# Patient Record
Sex: Female | Born: 1965
Health system: Southern US, Community
[De-identification: ages and names within clinical notes are randomized; demographics above are authoritative.]

## PROBLEM LIST (undated history)

## (undated) DIAGNOSIS — J45909 Unspecified asthma, uncomplicated: Secondary | ICD-10-CM

## (undated) DIAGNOSIS — K219 Gastro-esophageal reflux disease without esophagitis: Secondary | ICD-10-CM

## (undated) DIAGNOSIS — T7840XA Allergy, unspecified, initial encounter: Secondary | ICD-10-CM

## (undated) DIAGNOSIS — E282 Polycystic ovarian syndrome: Secondary | ICD-10-CM

## (undated) DIAGNOSIS — E559 Vitamin D deficiency, unspecified: Secondary | ICD-10-CM

## (undated) DIAGNOSIS — M1990 Unspecified osteoarthritis, unspecified site: Secondary | ICD-10-CM

## (undated) DIAGNOSIS — E210 Primary hyperparathyroidism: Secondary | ICD-10-CM

## (undated) DIAGNOSIS — E039 Hypothyroidism, unspecified: Secondary | ICD-10-CM

## (undated) DIAGNOSIS — K5900 Constipation, unspecified: Secondary | ICD-10-CM

## (undated) DIAGNOSIS — E739 Lactose intolerance, unspecified: Secondary | ICD-10-CM

## (undated) DIAGNOSIS — Z87442 Personal history of urinary calculi: Secondary | ICD-10-CM

## (undated) DIAGNOSIS — R7303 Prediabetes: Secondary | ICD-10-CM

## (undated) DIAGNOSIS — F329 Major depressive disorder, single episode, unspecified: Secondary | ICD-10-CM

## (undated) DIAGNOSIS — Z86718 Personal history of other venous thrombosis and embolism: Secondary | ICD-10-CM

## (undated) DIAGNOSIS — M2550 Pain in unspecified joint: Secondary | ICD-10-CM

## (undated) DIAGNOSIS — G479 Sleep disorder, unspecified: Secondary | ICD-10-CM

## (undated) DIAGNOSIS — I82409 Acute embolism and thrombosis of unspecified deep veins of unspecified lower extremity: Secondary | ICD-10-CM

## (undated) DIAGNOSIS — I872 Venous insufficiency (chronic) (peripheral): Secondary | ICD-10-CM

## (undated) DIAGNOSIS — M7989 Other specified soft tissue disorders: Secondary | ICD-10-CM

## (undated) DIAGNOSIS — E079 Disorder of thyroid, unspecified: Secondary | ICD-10-CM

## (undated) DIAGNOSIS — G4730 Sleep apnea, unspecified: Secondary | ICD-10-CM

## (undated) DIAGNOSIS — D649 Anemia, unspecified: Secondary | ICD-10-CM

## (undated) DIAGNOSIS — F32A Depression, unspecified: Secondary | ICD-10-CM

## (undated) DIAGNOSIS — M549 Dorsalgia, unspecified: Secondary | ICD-10-CM

## (undated) HISTORY — DX: Acute embolism and thrombosis of unspecified deep veins of unspecified lower extremity: I82.409

## (undated) HISTORY — DX: Unspecified osteoarthritis, unspecified site: M19.90

## (undated) HISTORY — DX: Constipation, unspecified: K59.00

## (undated) HISTORY — DX: Personal history of other venous thrombosis and embolism: Z86.718

## (undated) HISTORY — DX: Sleep disorder, unspecified: G47.9

## (undated) HISTORY — DX: Allergy, unspecified, initial encounter: T78.40XA

## (undated) HISTORY — DX: Venous insufficiency (chronic) (peripheral): I87.2

## (undated) HISTORY — DX: Primary hyperparathyroidism: E21.0

## (undated) HISTORY — DX: Lactose intolerance, unspecified: E73.9

## (undated) HISTORY — DX: Other specified soft tissue disorders: M79.89

## (undated) HISTORY — DX: Polycystic ovarian syndrome: E28.2

## (undated) HISTORY — PX: GASTRIC RESTRICTION SURGERY: SHX653

## (undated) HISTORY — DX: Prediabetes: R73.03

## (undated) HISTORY — DX: Pain in unspecified joint: M25.50

## (undated) HISTORY — DX: Major depressive disorder, single episode, unspecified: F32.9

## (undated) HISTORY — DX: Dorsalgia, unspecified: M54.9

## (undated) HISTORY — PX: CHOLECYSTECTOMY: SHX55

## (undated) HISTORY — PX: HERNIA REPAIR: SHX51

## (undated) HISTORY — DX: Depression, unspecified: F32.A

## (undated) HISTORY — DX: Vitamin D deficiency, unspecified: E55.9

## (undated) HISTORY — DX: Hypothyroidism, unspecified: E03.9

---

## 1998-04-27 ENCOUNTER — Encounter: Admission: RE | Admit: 1998-04-27 | Discharge: 1998-07-26 | Payer: Self-pay

## 1998-05-09 ENCOUNTER — Inpatient Hospital Stay (HOSPITAL_COMMUNITY): Admission: AD | Admit: 1998-05-09 | Discharge: 1998-05-16 | Payer: Self-pay | Admitting: Cardiovascular Disease

## 1998-05-09 ENCOUNTER — Encounter: Payer: Self-pay | Admitting: Cardiovascular Disease

## 1998-07-12 ENCOUNTER — Encounter: Admission: RE | Admit: 1998-07-12 | Discharge: 1998-07-12 | Payer: Self-pay | Admitting: Obstetrics & Gynecology

## 1999-11-29 ENCOUNTER — Emergency Department (HOSPITAL_COMMUNITY): Admission: EM | Admit: 1999-11-29 | Discharge: 1999-11-29 | Payer: Self-pay | Admitting: Emergency Medicine

## 1999-11-29 ENCOUNTER — Encounter: Payer: Self-pay | Admitting: Emergency Medicine

## 2001-08-26 ENCOUNTER — Emergency Department (HOSPITAL_COMMUNITY): Admission: EM | Admit: 2001-08-26 | Discharge: 2001-08-26 | Payer: Self-pay | Admitting: Emergency Medicine

## 2003-05-05 ENCOUNTER — Encounter: Admission: RE | Admit: 2003-05-05 | Discharge: 2003-08-03 | Payer: Self-pay | Admitting: Internal Medicine

## 2003-08-11 ENCOUNTER — Encounter: Admission: RE | Admit: 2003-08-11 | Discharge: 2003-11-09 | Payer: Self-pay | Admitting: Internal Medicine

## 2003-10-21 ENCOUNTER — Other Ambulatory Visit: Admission: RE | Admit: 2003-10-21 | Discharge: 2003-10-21 | Payer: Self-pay | Admitting: Obstetrics and Gynecology

## 2003-10-29 ENCOUNTER — Ambulatory Visit (HOSPITAL_COMMUNITY): Admission: RE | Admit: 2003-10-29 | Discharge: 2003-10-29 | Payer: Self-pay | Admitting: Obstetrics and Gynecology

## 2003-11-17 ENCOUNTER — Encounter: Admission: RE | Admit: 2003-11-17 | Discharge: 2004-02-15 | Payer: Self-pay | Admitting: Internal Medicine

## 2005-08-22 ENCOUNTER — Ambulatory Visit (HOSPITAL_COMMUNITY): Admission: RE | Admit: 2005-08-22 | Discharge: 2005-08-22 | Payer: Self-pay | Admitting: Surgery

## 2005-08-23 ENCOUNTER — Encounter: Admission: RE | Admit: 2005-08-23 | Discharge: 2005-08-23 | Payer: Self-pay | Admitting: Internal Medicine

## 2005-08-24 ENCOUNTER — Ambulatory Visit (HOSPITAL_COMMUNITY): Admission: RE | Admit: 2005-08-24 | Discharge: 2005-08-24 | Payer: Self-pay | Admitting: Surgery

## 2005-08-25 ENCOUNTER — Ambulatory Visit (HOSPITAL_BASED_OUTPATIENT_CLINIC_OR_DEPARTMENT_OTHER): Admission: RE | Admit: 2005-08-25 | Discharge: 2005-08-25 | Payer: Self-pay | Admitting: Surgery

## 2005-09-02 ENCOUNTER — Ambulatory Visit: Payer: Self-pay | Admitting: Internal Medicine

## 2005-11-29 ENCOUNTER — Ambulatory Visit (HOSPITAL_COMMUNITY): Admission: RE | Admit: 2005-11-29 | Discharge: 2005-11-29 | Payer: Self-pay | Admitting: Surgery

## 2006-02-15 ENCOUNTER — Ambulatory Visit (HOSPITAL_COMMUNITY): Admission: RE | Admit: 2006-02-15 | Discharge: 2006-02-15 | Payer: Self-pay | Admitting: Surgery

## 2006-03-08 ENCOUNTER — Ambulatory Visit (HOSPITAL_COMMUNITY): Admission: RE | Admit: 2006-03-08 | Discharge: 2006-03-08 | Payer: Self-pay | Admitting: Surgery

## 2008-03-22 ENCOUNTER — Ambulatory Visit (HOSPITAL_COMMUNITY): Admission: RE | Admit: 2008-03-22 | Discharge: 2008-03-22 | Payer: Self-pay | Admitting: Surgery

## 2008-03-23 ENCOUNTER — Encounter: Admission: RE | Admit: 2008-03-23 | Discharge: 2008-03-23 | Payer: Self-pay | Admitting: Surgery

## 2008-04-05 ENCOUNTER — Ambulatory Visit (HOSPITAL_COMMUNITY): Admission: RE | Admit: 2008-04-05 | Discharge: 2008-04-05 | Payer: Self-pay | Admitting: Surgery

## 2009-01-13 ENCOUNTER — Encounter: Admission: RE | Admit: 2009-01-13 | Discharge: 2009-04-13 | Payer: Self-pay | Admitting: Surgery

## 2009-01-20 ENCOUNTER — Encounter: Admission: RE | Admit: 2009-01-20 | Discharge: 2009-01-20 | Payer: Self-pay | Admitting: Surgery

## 2009-01-31 ENCOUNTER — Encounter (INDEPENDENT_AMBULATORY_CARE_PROVIDER_SITE_OTHER): Payer: Self-pay | Admitting: Surgery

## 2009-01-31 ENCOUNTER — Ambulatory Visit: Payer: Self-pay | Admitting: Vascular Surgery

## 2009-01-31 ENCOUNTER — Ambulatory Visit (HOSPITAL_COMMUNITY): Admission: RE | Admit: 2009-01-31 | Discharge: 2009-02-01 | Payer: Self-pay | Admitting: Surgery

## 2009-04-28 ENCOUNTER — Encounter: Admission: RE | Admit: 2009-04-28 | Discharge: 2009-05-18 | Payer: Self-pay | Admitting: Surgery

## 2010-08-25 LAB — CBC
HCT: 35.8 % — ABNORMAL LOW (ref 36.0–46.0)
Hemoglobin: 11.8 g/dL — ABNORMAL LOW (ref 12.0–15.0)
Hemoglobin: 13.1 g/dL (ref 12.0–15.0)
MCHC: 33.2 g/dL (ref 30.0–36.0)
MCV: 78.2 fL (ref 78.0–100.0)
RDW: 16 % — ABNORMAL HIGH (ref 11.5–15.5)
WBC: 5.4 10*3/uL (ref 4.0–10.5)

## 2010-08-25 LAB — COMPREHENSIVE METABOLIC PANEL
ALT: 24 U/L (ref 0–35)
BUN: 11 mg/dL (ref 6–23)
CO2: 25 mEq/L (ref 19–32)
Creatinine, Ser: 0.73 mg/dL (ref 0.4–1.2)
Glucose, Bld: 102 mg/dL — ABNORMAL HIGH (ref 70–99)
Sodium: 139 mEq/L (ref 135–145)
Total Protein: 7.3 g/dL (ref 6.0–8.3)

## 2010-08-25 LAB — DIFFERENTIAL
Basophils Absolute: 0 10*3/uL (ref 0.0–0.1)
Basophils Relative: 0 % (ref 0–1)
Eosinophils Absolute: 0.2 10*3/uL (ref 0.0–0.7)
Eosinophils Relative: 0 % (ref 0–5)
Lymphocytes Relative: 27 % (ref 12–46)
Lymphs Abs: 1.3 10*3/uL (ref 0.7–4.0)
Lymphs Abs: 1.5 10*3/uL (ref 0.7–4.0)
Monocytes Relative: 6 % (ref 3–12)
Monocytes Relative: 7 % (ref 3–12)
Neutro Abs: 5.8 10*3/uL (ref 1.7–7.7)
Neutrophils Relative %: 61 % (ref 43–77)

## 2010-10-06 NOTE — Procedures (Signed)
Cynthia [name], Cynthia [name]               ACCOUNT NO.:  [id number]   MEDICAL RECORD NO.:  [id number]          PATIENT TYPE:  OUT   LOCATION:  SLEEP CENTER                 FACILITY:  [facility] [place] [facility]   PHYSICIAN:  Clinton D. Maple Hudson, M.D. DATE OF BIRTH:  [birth year]   DATE OF STUDY:  08/25/2005                              NOCTURNAL POLYSOMNOGRAM   REFERRING PHYSICIAN:  Dr. Luretha Murphy   INDICATION FOR STUDY:  Insomnia with sleep apnea.   EPWORTH SLEEPINESS SCORE:  6/24, BMI 53, weight 344 pounds.  Patient  describes a stressful lifestyle, fragmented sleep, and third shift work  responsibility.   MEDICATIONS:  HCTZ, aspirin, multivitamins.   SLEEP ARCHITECTURE:  Total sleep time 401 minutes with sleep efficiency 93%.  Stage I was 7%, stage II 63%, stages III and 4%, REM 26% of total sleep  time.  Sleep latency 7 minutes, REM latency 69 minutes, awake after sleep  onset 16 minutes, arousal index 19.9.  No bedtime medication taken.   RESPIRATORY DATA:  Apnea/hypopnea index (AHI, RDI) 20 obstructive events per  hour indicating moderate obstructive sleep apnea/hypopnea syndrome.  This  included 26 obstructive apneas and 108 hypopneas.  Events were not  positional.  REM AHI 49.7 per hour.  She did not have enough early events to  qualify for CPAP titration by split protocol.   OXYGEN DATA:  Moderate to loud snoring during approximately 50% of the night  with oxygen desaturation to a nadir of 84%.  Mean oxygen saturation through  the study was 94% on room air.   CARDIAC DATA:  Sinus rhythm.   MOVEMENT-PARASOMNIA:  No significant movement disturbance.  Bathroom x1.   IMPRESSION/RECOMMENDATIONS:  1.  Moderate obstructive sleep apnea/hypopnea syndrome occurring in all of      sleep positions, but more common while supine and in REM with moderate      to loud snoring at oxygen desaturation to 84%.  2.  Consider return for CPAP titration or evaluate for alternative therapies      as appropriate.  If  surgical management      is anticipated before she is titrated for CPAP, then suggest use of      empiric CPAP pressure of 10 CWP through the time of necessary surgery      and postoperative management until she can return for formal CPAP      titration.      Clinton D. Maple Hudson, M.D.  Diplomate, Biomedical engineer of Sleep Medicine  Electronically Signed     CDY/MEDQ  D:  09/02/2005 13:47:06  T:  09/03/2005 08:16:06  Job:  [id number]

## 2010-10-19 ENCOUNTER — Other Ambulatory Visit (INDEPENDENT_AMBULATORY_CARE_PROVIDER_SITE_OTHER): Payer: Self-pay | Admitting: Surgery

## 2010-10-19 ENCOUNTER — Encounter (HOSPITAL_COMMUNITY): Payer: 59

## 2010-10-19 LAB — CBC
Hemoglobin: 12.7 g/dL (ref 12.0–15.0)
Platelets: 225 10*3/uL (ref 150–400)
RBC: 5.05 MIL/uL (ref 3.87–5.11)
RDW: 14.4 % (ref 11.5–15.5)
WBC: 7 10*3/uL (ref 4.0–10.5)

## 2010-10-19 LAB — BASIC METABOLIC PANEL
Calcium: 10.6 mg/dL — ABNORMAL HIGH (ref 8.4–10.5)
Creatinine, Ser: 0.65 mg/dL (ref 0.4–1.2)
GFR calc non Af Amer: >60 mL/min (ref >60)
Potassium: 3.6 mEq/L (ref 3.5–5.1)

## 2010-10-19 LAB — SURGICAL PCR SCREEN: Staphylococcus aureus: NEGATIVE

## 2010-10-25 ENCOUNTER — Ambulatory Visit (HOSPITAL_COMMUNITY)
Admission: RE | Admit: 2010-10-25 | Discharge: 2010-10-26 | Disposition: A | Discharge status: Home / Self Care | Payer: 59 | Source: Ambulatory Visit | Attending: Surgery | Admitting: Surgery

## 2010-10-25 ENCOUNTER — Ambulatory Visit (HOSPITAL_COMMUNITY): Payer: 59

## 2010-10-25 ENCOUNTER — Other Ambulatory Visit (INDEPENDENT_AMBULATORY_CARE_PROVIDER_SITE_OTHER): Payer: Self-pay | Admitting: Surgery

## 2010-10-25 DIAGNOSIS — Z79899 Other long term (current) drug therapy: Secondary | ICD-10-CM | POA: Insufficient documentation

## 2010-10-25 DIAGNOSIS — K8010 Calculus of gallbladder with chronic cholecystitis without obstruction: Secondary | ICD-10-CM | POA: Insufficient documentation

## 2010-10-25 DIAGNOSIS — Z86718 Personal history of other venous thrombosis and embolism: Secondary | ICD-10-CM | POA: Insufficient documentation

## 2010-10-25 DIAGNOSIS — E669 Obesity, unspecified: Secondary | ICD-10-CM | POA: Insufficient documentation

## 2010-10-25 DIAGNOSIS — Z9884 Bariatric surgery status: Secondary | ICD-10-CM | POA: Insufficient documentation

## 2010-10-26 LAB — CBC
HCT: 35.7 % — ABNORMAL LOW (ref 36.0–46.0)
Hemoglobin: 11.3 g/dL — ABNORMAL LOW (ref 12.0–15.0)
MCHC: 31.7 g/dL (ref 30.0–36.0)
RBC: 4.48 MIL/uL (ref 3.87–5.11)
WBC: 6.9 10*3/uL (ref 4.0–10.5)

## 2010-10-26 LAB — COMPREHENSIVE METABOLIC PANEL
AST: 29 U/L (ref 0–37)
Albumin: 3.1 g/dL — ABNORMAL LOW (ref 3.5–5.2)
Alkaline Phosphatase: 51 U/L (ref 39–117)
BUN: 5 mg/dL — ABNORMAL LOW (ref 6–23)
Calcium: 10.7 mg/dL — ABNORMAL HIGH (ref 8.4–10.5)
GFR calc Af Amer: >60 mL/min (ref >60)
GFR calc non Af Amer: >60 mL/min (ref >60)
Sodium: 138 mEq/L (ref 135–145)
Total Protein: 6.4 g/dL (ref 6.0–8.3)

## 2010-11-01 NOTE — Op Note (Signed)
  NAMETHIRZA, PELLICANO               ACCOUNT NO.:  [id number]  MEDICAL RECORD NO.:  [id number]  LOCATION:  DAYL                         FACILITY:  [facility]  PHYSICIAN:  Thornton Park. Daphine Deutscher, MD  DATE OF BIRTH:  04-26-1966  DATE OF PROCEDURE:  10/25/2010 DATE OF DISCHARGE:                              OPERATIVE REPORT   PREOPERATIVE DIAGNOSIS:  Gallstones status post lap band.  PROCEDURE:  Laparoscopic cholecystectomy with intraoperative cholangiogram.  POSTOPERATIVE DIAGNOSIS:  Evidence of chronic cholecystitis with gallbladder infundibulum stuck to the duodenum.  SURGEON:  Thornton Park. Daphine Deutscher, MD  ASSISTANT:  Anselm Pancoast. Zachery Dakins, M.D.  ANESTHESIA:  General anesthesia.  DESCRIPTION OF PROCEDURE:  This 45 year old African-American lady was taken to room #1 on Wednesday, October 25, 2010, and given general anesthesia.  The abdomen was prepped with PCMX.  Time-out was performed. The abdomen was entered through the right upper quadrant with a 0-degree 5-mm Optiview without difficulty.  The abdomen was insufflated.  A total of 5 trocars were needed, mostly 5 and one 11 in the upper midline. This was subsequently closed at the end of the case with Endoclose as I had to dilate to get the bag containing the gallstone out.  In the meantime, the gallbladder was elevated over the liver.  The infundibulum was stuck to the duodenum.  This was teased away with blunt dissection minimizing the monopolar cautery.  Then a critical view was obtained after opening up that area and lot of stickiness to the infundibulum was taken down.  Clip was placed in the gallbladder and a cholangiogram performed which showed good intrahepatic filling, free flow into the duodenum.  No evidence of stones.  Cystic duct was triple clipped, divided.  The cystic artery was triple clipped and divided.  The gallbladder was removed the gallbladder bed with hook electrocautery without spilling any stones.  The gallbladder was  then placed in a bag and brought out through the upper midline 11 mm port after dilating it. As I mentioned the Endoclose was used to secure the upper port.  The ports were injected with Marcaine.  The gallbladder bed was irrigated. No bleeding or bile leaks were noted.  Skin was closed with 4-0 Vicryl, Benzoin and Steri-Strips.  The patient tolerated the procedure well and was taken to the recovery room in satisfactory addition.     Thornton Park Daphine Deutscher, MD    MBM/MEDQ  D:  10/25/2010  T:  10/25/2010  Job:  [id number]  Electronically Signed by Luretha Murphy MD on 11/01/2010 04:33:26 PM

## 2010-11-09 ENCOUNTER — Encounter (INDEPENDENT_AMBULATORY_CARE_PROVIDER_SITE_OTHER): Payer: Self-pay | Admitting: Surgery

## 2010-11-30 ENCOUNTER — Ambulatory Visit (INDEPENDENT_AMBULATORY_CARE_PROVIDER_SITE_OTHER): Payer: Commercial Managed Care - PPO | Admitting: Surgery

## 2010-11-30 ENCOUNTER — Encounter (INDEPENDENT_AMBULATORY_CARE_PROVIDER_SITE_OTHER): Payer: Self-pay | Admitting: Surgery

## 2010-11-30 VITALS — BP 122/72 | Ht 67.5 in | Wt 314.0 lb

## 2010-11-30 DIAGNOSIS — Z9049 Acquired absence of other specified parts of digestive tract: Secondary | ICD-10-CM

## 2010-11-30 DIAGNOSIS — Z9889 Other specified postprocedural states: Secondary | ICD-10-CM

## 2010-11-30 NOTE — Progress Notes (Signed)
Crystalloid comes in today after a laparoscopic cholecystectomy done in June 6. Since then her pain has abated she is feeling very good. Today's weight is 314 I added 0.5 cc to her band. She had had a band fills a year ago. She's not feeling restricted.  Plan return 2 months

## 2010-12-15 ENCOUNTER — Encounter (INDEPENDENT_AMBULATORY_CARE_PROVIDER_SITE_OTHER): Payer: Self-pay | Admitting: Surgery

## 2011-03-07 ENCOUNTER — Encounter (INDEPENDENT_AMBULATORY_CARE_PROVIDER_SITE_OTHER): Payer: Commercial Managed Care - PPO | Admitting: Surgery

## 2011-03-13 ENCOUNTER — Encounter (INDEPENDENT_AMBULATORY_CARE_PROVIDER_SITE_OTHER): Payer: Self-pay | Admitting: Surgery

## 2011-05-01 ENCOUNTER — Emergency Department (HOSPITAL_COMMUNITY)
Admission: EM | Admit: 2011-05-01 | Discharge: 2011-05-01 | Disposition: A | Discharge status: Home / Self Care | Payer: 59 | Attending: Emergency Medicine | Admitting: Emergency Medicine

## 2011-05-01 ENCOUNTER — Encounter (HOSPITAL_COMMUNITY): Payer: Self-pay | Admitting: Emergency Medicine

## 2011-05-01 ENCOUNTER — Emergency Department (HOSPITAL_COMMUNITY): Payer: 59

## 2011-05-01 DIAGNOSIS — M25519 Pain in unspecified shoulder: Secondary | ICD-10-CM | POA: Insufficient documentation

## 2011-05-01 DIAGNOSIS — X500XXA Overexertion from strenuous movement or load, initial encounter: Secondary | ICD-10-CM | POA: Insufficient documentation

## 2011-05-01 NOTE — ED Provider Notes (Signed)
History     CSN: [id number] Arrival date & time: 05/01/2011 11:31 AM   First MD Initiated Contact with Patient 05/01/11 1347      Chief Complaint  Patient presents with  . Shoulder Pain    (Consider location/radiation/quality/duration/timing/severity/associated sxs/prior treatment) HPI  Pt presents to the ED with complaints of arm pain for 3 days. She states that the pain is in her right shoulder. She says that she can move her elbow but is unable to move her right shoulder joint due to pain. She says that if I try to move it she will scream. Pt does not have pain that is as severe when she does not move her arm. She denies previous injury, falling, or a hx of the same symptoms.Pt is able to use her hand and move her elbow. Pt now remembers lifting a box and that was when she injured her arm.  History reviewed. No pertinent past medical history.  Past Surgical History  Procedure Date  . Hernia repair   . Gastric restriction surgery     lap band    No family history on file.  History  Substance Use Topics  . Smoking status: Never Smoker   . Smokeless tobacco: Not on file  . Alcohol Use: No    OB History    Grav Para Term Preterm Abortions TAB SAB Ect Mult Living                  Review of Systems  Allergies  Latex  Home Medications   Current Outpatient Rx  Name Route Sig Dispense Refill  . ACETAMINOPHEN 325 MG PO TABS Oral Take 650 mg by mouth every 6 (six) hours as needed. For pain     . ASPIRIN 325 MG PO TABS Oral Take 325 mg by mouth daily.      Marland Kitchen CALCIUM CARBONATE ANTACID 500 MG PO CHEW Oral Chew 1 tablet by mouth 3 (three) times daily as needed. For heartburn     . VITAMIN B-12 50 MCG PO TABS Oral Take 50 mcg by mouth daily as needed. When tired     . ZOLPIDEM TARTRATE 5 MG PO TABS Oral Take 5 mg by mouth at bedtime as needed. For sleep       BP 136/65  Pulse 60  Temp(Src) 98.1 F (36.7 C) (Oral)  Resp 24  SpO2 100%  LMP 04/18/2011  Physical Exam   Nursing note and vitals reviewed. Constitutional: She appears well-developed and well-nourished.  HENT:  Head: Normocephalic and atraumatic.  Eyes: Pupils are equal, round, and reactive to light.  Neck: Trachea normal, normal range of motion and full passive range of motion without pain. Neck supple.  Cardiovascular: Normal rate, regular rhythm and normal pulses.   Pulmonary/Chest: Effort normal and breath sounds normal. Chest wall is not dull to percussion. She exhibits no tenderness, no crepitus, no edema, no deformity and no retraction.  Abdominal: Normal appearance.  Musculoskeletal: Normal range of motion.       Arms: Neurological: She is alert. She has normal strength.  Skin: Skin is warm, dry and intact.  Psychiatric: She has a normal mood and affect. Her speech is normal and behavior is normal. Judgment and thought content normal. Cognition and memory are normal.    ED Course  Procedures (including critical care time)  Labs Reviewed - No data to display Dg Shoulder Right  05/01/2011  *RADIOLOGY REPORT*  Clinical Data: Posterior shoulder pain.  No known injury.  RIGHT  SHOULDER - 2+ VIEW  Comparison: None  Findings: No acute bony abnormality.  Specifically, no fracture, subluxation, or dislocation.  Soft tissues are intact.  IMPRESSION: No acute bony abnormality.  Original Report Authenticated By: Cyndie Chime, M.D.     No diagnosis found.    MDM   Xray shows no deformities or abornalities. No findings on xray to explain patients pain.  Pt declines medication in the ED and declines a prescription for home.      Dorthula Matas, PA 05/01/11 1529

## 2011-05-01 NOTE — ED Notes (Signed)
Pt states pain in arm x 3 days, from shoulder to neck, denies fall or injury.

## 2011-05-01 NOTE — ED Provider Notes (Signed)
Medical screening examination/treatment/procedure(s) were performed by non-physician practitioner and as supervising physician I was immediately available for consultation/collaboration.  Parrish Bonn R. Tenishia Ekman, MD 05/01/11 1529 

## 2012-01-18 ENCOUNTER — Telehealth (INDEPENDENT_AMBULATORY_CARE_PROVIDER_SITE_OTHER): Payer: Self-pay | Admitting: General Surgery

## 2012-01-18 NOTE — Telephone Encounter (Signed)
Pt calling for appt with Dr. Daphine Deutscher.  She had remote lap band surgery and recent lap chole.  Now is experiencing  "an aching pain" under the Rt breast at the rib cage.  States it is more like a cramping-type pain but denies it is gas pain.  Tylenol does not help. Concurrently, she is experiencing constipation.  She is taking Miralax BID and Ducolax, and she is passing "lots" of gas.  Advised her to increase her po fluids, walk more, use two capfuls of Miralax BID and consider MOM and/ or stool softeners.  Will see if appt for 03/05/12 can be moved forward.

## 2012-01-24 NOTE — Telephone Encounter (Signed)
Call and speak to patient about moving to earlier appt, Patient is schedule to come in on 01/28/2012

## 2012-01-28 ENCOUNTER — Ambulatory Visit (INDEPENDENT_AMBULATORY_CARE_PROVIDER_SITE_OTHER): Payer: Commercial Managed Care - PPO | Admitting: Surgery

## 2012-01-28 VITALS — BP 118/78 | HR 64 | Temp 97.8°F | Resp 14 | Ht 66.5 in | Wt 262.0 lb

## 2012-01-28 DIAGNOSIS — K5900 Constipation, unspecified: Secondary | ICD-10-CM

## 2012-01-28 DIAGNOSIS — Z9884 Bariatric surgery status: Secondary | ICD-10-CM | POA: Insufficient documentation

## 2012-01-28 NOTE — Progress Notes (Signed)
Cynthia [name] 46 y.o.  There is no height or weight on file to calculate BMI.  There is no problem list on file for this patient.   Allergies  Allergen Reactions  . Latex Rash    Past Surgical History  Procedure Date  . Hernia repair   . Gastric restriction surgery     lap band   RANKINS,VICTORIA, MD No diagnosis found.  Main complaint today is constipation.  We discussed the reason why she was here initially I thought she was here for a band fill. She said she weighed herself recently and she is down to 62 but has a goal weight of 185. However she is here today because she has not been able to have a bowel movement in 2 months without taking a laxative or stimulant.  She denies any bleeding. She has a family history of colon cancer in her grandmother. I discussed colonoscopy and barium enema with her and I think that we will do a air contrast barium enema first to assess her colon for any potential obstructive lesions it may be contributing to her constipation. I'll see her back after that for disposition of this problem and we can consider any further band adjustment. Matt B. Daphine Deutscher, MD, [facility] [phone] beeper [phone]  01/28/2012 10:08 AM

## 2012-01-28 NOTE — Patient Instructions (Addendum)
Barium Enema A barium enema has been ordered for you by your caregiver to check out (evaluate) your large intestine (colon). This test helps to evaluate the large bowel and rectum for the problems which may have brought you to your caregiver. This is also performed as part of a routine physical exam in later years of life and then it is used to search for polyps and lumps (tumors) or other disease processes which could shorten or affect life. LET YOUR CAREGIVER KNOW ABOUT:  Allergies.   Medications taken including herbs, eye drops, over-the-counter medications, and creams.   Use of steroids (by mouth or creams).   Previous problems with anesthetics.   Possibility of pregnancy, if this applies.   History of blood clots (thrombophlebitis).   History of bleeding or blood problems.   Previous surgery.   Previous or existing colon problems.   Other health problems.  Let your technologist know if you were unable to complete the preparations for your test or unable to follow the dietary instructions. BEFORE THE PROCEDURE Follow your instructions for test preparation so it will not need rescheduling. Try to follow instructions carefully as this will clean out your colon and improve the quality of your X-rays. A liquid diet may be required for a few days before testing. No food is allowed the night before the test. Suppositories, laxatives, or an enema may be required before testing. This preparation is to empty your colon before the test. Arrive here 60 minutes before check in or as directed. The actual test takes about an hour. PROCEDURE A small tube is inserted into your rectum. This has a tip on the end with a balloon that can be inflated. The balloon is used to help you retain the barium that is put into your colon prior to the X-rays. Barium is a white chalky substance which helps outline the inside of your colon on X-ray. You may feel the need to go to the bathroom; however the balloon will  help prevent this while X-rays are being taken. During your exam you may be told to shift position and to hold your breath briefly. Some gentle pressure may be applied to your belly (abdomen). All of this is done to obtain better X-rays. Following the X-rays you will be allowed to go the bathroom. Some times air may be put into the colon through the tube to obtain even better X-rays if necessary. These are called air contrast studies. A final X-ray may then be taken. You may leave when the technologist informs you that you are through. For your comfort during the test:  Relax as much as possible during the test.   Try to follow your technologist's instructions to speed up the test and make it more effective.  AFTER THE PROCEDURE  You may resume normal activities.   Call for your X-ray results as instructed by your caregiver or technologist.   Call your caregiver as instructed.   Drink plenty of fluids and eat enough fruits and vegetables to keep your stools soft. The stools may appear lighter for a few days following the test. This is from the barium being passed in the stool.  SEEK IMMEDIATE MEDICAL CARE IF:  You should become lightheaded or faint.   You pass blood from the rectum.   You develop abdominal pain.  Document Released: 05/04/2000 Document Revised: 04/26/2011 Document Reviewed: 12/03/2008 [facility] Patient Information 2012 [place], [place].

## 2012-01-31 ENCOUNTER — Ambulatory Visit (HOSPITAL_COMMUNITY)
Admission: RE | Admit: 2012-01-31 | Discharge: 2012-01-31 | Disposition: A | Discharge status: Home / Self Care | Payer: 59 | Source: Ambulatory Visit | Attending: Surgery | Admitting: Surgery

## 2012-01-31 DIAGNOSIS — K5900 Constipation, unspecified: Secondary | ICD-10-CM | POA: Insufficient documentation

## 2012-02-05 ENCOUNTER — Telehealth (INDEPENDENT_AMBULATORY_CARE_PROVIDER_SITE_OTHER): Payer: Self-pay | Admitting: General Surgery

## 2012-02-05 NOTE — Telephone Encounter (Signed)
Spoke with pt and asked her if she would be willing to move her appt on 9/18 from 9:40 to 2:20.  She was fine with this.

## 2012-02-06 ENCOUNTER — Encounter (INDEPENDENT_AMBULATORY_CARE_PROVIDER_SITE_OTHER): Payer: Self-pay | Admitting: Surgery

## 2012-02-06 ENCOUNTER — Ambulatory Visit (INDEPENDENT_AMBULATORY_CARE_PROVIDER_SITE_OTHER): Payer: Commercial Managed Care - PPO | Admitting: Surgery

## 2012-02-06 VITALS — BP 122/70 | HR 70 | Temp 96.8°F | Resp 16 | Ht 67.0 in | Wt 262.1 lb

## 2012-02-06 DIAGNOSIS — K5900 Constipation, unspecified: Secondary | ICD-10-CM | POA: Insufficient documentation

## 2012-02-06 DIAGNOSIS — Z9884 Bariatric surgery status: Secondary | ICD-10-CM

## 2012-02-06 NOTE — Progress Notes (Signed)
Cynthia [name] Body mass index is 41.05 kg/(m^2).  Having regurgitation:  no  Nocturnal reflux?  no  Amount of fill  +0.5  Followup visit today after her barium enema. Her barium enema did not show any constricting masses. I recommended that she take MiraLax periodically.  We were unable to find her chart that she needed a band fill. I added empirically 0.5 cc to her band. I'll see her back in 6 weeks

## 2012-02-06 NOTE — Patient Instructions (Signed)

## 2012-03-04 ENCOUNTER — Telehealth (INDEPENDENT_AMBULATORY_CARE_PROVIDER_SITE_OTHER): Payer: Self-pay | Admitting: General Surgery

## 2012-03-04 NOTE — Telephone Encounter (Signed)
LMOM letting pt know that I had to reschedule her appt from 11/8 to 11/20.

## 2012-03-07 ENCOUNTER — Encounter (INDEPENDENT_AMBULATORY_CARE_PROVIDER_SITE_OTHER): Payer: Self-pay | Admitting: Surgery

## 2012-03-28 ENCOUNTER — Encounter (INDEPENDENT_AMBULATORY_CARE_PROVIDER_SITE_OTHER): Payer: Commercial Managed Care - PPO | Admitting: Surgery

## 2012-04-09 ENCOUNTER — Ambulatory Visit (INDEPENDENT_AMBULATORY_CARE_PROVIDER_SITE_OTHER): Payer: Commercial Managed Care - PPO | Admitting: Surgery

## 2012-04-09 ENCOUNTER — Encounter (INDEPENDENT_AMBULATORY_CARE_PROVIDER_SITE_OTHER): Payer: Self-pay | Admitting: Surgery

## 2012-04-09 VITALS — BP 124/68 | HR 64 | Temp 97.4°F | Resp 20 | Ht 67.0 in | Wt 261.0 lb

## 2012-04-09 DIAGNOSIS — Z9884 Bariatric surgery status: Secondary | ICD-10-CM

## 2012-04-09 NOTE — Progress Notes (Signed)
Cynthia [name] Body mass index is 40.88 kg/(m^2).  Having regurgitation:  no  Nocturnal reflux?  no  Amount of fill  0.5  Weight is down to 261 with a BMI of 40.  She works at [employer].  She likes [employer] and we talked about what she can eat there.   Return 2 months

## 2012-04-09 NOTE — Patient Instructions (Addendum)

## 2012-05-21 HISTORY — PX: COLONOSCOPY: SHX174

## 2012-06-12 ENCOUNTER — Encounter (INDEPENDENT_AMBULATORY_CARE_PROVIDER_SITE_OTHER): Payer: Self-pay

## 2012-06-12 ENCOUNTER — Ambulatory Visit (INDEPENDENT_AMBULATORY_CARE_PROVIDER_SITE_OTHER): Payer: Commercial Managed Care - PPO | Admitting: Physician Assistant

## 2012-06-12 DIAGNOSIS — Z9884 Bariatric surgery status: Secondary | ICD-10-CM

## 2012-06-12 NOTE — Patient Instructions (Signed)
Return in six months. Focus on good food choices as well as physical activity. Return sooner if you have an increase in hunger, portion sizes or weight. Return also for difficulty swallowing, night cough, reflux.   

## 2012-06-12 NOTE — Progress Notes (Signed)
  HISTORY: Cynthia [name] is a 47 y.o.female who received an AP-Large lap-band in September 2010 by Dr. Daphine Deutscher. She comes in with no weight gain since her last adjustment before the holidays. She's joined [employer] and is having good success with it. She denies regurgitation, reflux, increased hunger or increased portion sizes. She does complain of frequent awakening with gasping and those close to her say that she has significant snoring. She's often tired by day. Her concern is for sleep apnea.  VITAL SIGNS: Filed Vitals:   06/12/12 0955  BP: 110/64  Pulse: 69  Temp: 97 F (36.1 C)  Resp: 16    PHYSICAL EXAM: Physical exam reveals a very well-appearing 47 y.o.female in no apparent distress Neurologic: Awake, alert, oriented Psych: Bright affect, conversant Respiratory: Breathing even and unlabored. No stridor or wheezing Extremities: Atraumatic, good range of motion. Skin: Warm, Dry, no rashes Musculoskeletal: Normal gait, Joints normal  ASSESMENT: 47 y.o.  female  s/p AP-Large lap-band.   PLAN: She didn't feel an adjustment is warranted today as her band is in the green zone. We discussed the sleep symptoms she's complaining of and I believe a sleep study is worth pursuing. She agrees. We'll look to set that up and have her return in six months.

## 2012-07-03 ENCOUNTER — Encounter (HOSPITAL_BASED_OUTPATIENT_CLINIC_OR_DEPARTMENT_OTHER): Payer: 59

## 2012-07-21 ENCOUNTER — Ambulatory Visit (HOSPITAL_BASED_OUTPATIENT_CLINIC_OR_DEPARTMENT_OTHER): Payer: 59

## 2012-07-22 ENCOUNTER — Ambulatory Visit (HOSPITAL_BASED_OUTPATIENT_CLINIC_OR_DEPARTMENT_OTHER): Payer: 59 | Attending: Surgery | Admitting: Radiology

## 2012-07-22 DIAGNOSIS — G4710 Hypersomnia, unspecified: Secondary | ICD-10-CM | POA: Insufficient documentation

## 2012-07-25 DIAGNOSIS — R0989 Other specified symptoms and signs involving the circulatory and respiratory systems: Secondary | ICD-10-CM

## 2012-07-25 DIAGNOSIS — R0609 Other forms of dyspnea: Secondary | ICD-10-CM

## 2012-07-25 DIAGNOSIS — G4730 Sleep apnea, unspecified: Secondary | ICD-10-CM

## 2012-07-25 DIAGNOSIS — G4710 Hypersomnia, unspecified: Secondary | ICD-10-CM

## 2012-07-26 NOTE — Procedures (Signed)
Cynthia [name], Cynthia [name]               ACCOUNT NO.:  [id number]  MEDICAL RECORD NO.:  [id number]          PATIENT TYPE:  OUT  LOCATION:  SLEEP CENTER                 [facility]ACILITY[facility]  PHYSICIAN:  Clinton D. Maple Hudson, MD, FCCP, FACPDATE OF BIRTH:  1966-03-19  DATE OF STUDY:  07/22/2012                           NOCTURNAL POLYSOMNOGRAM  REFERRING PHYSICIAN:  ANDY LIEPINS  INDICATION FOR STUDY:  Hypersomnia with sleep apnea.  EPWORTH SLEEPINESS SCORE:  12/24.  BMI 40.3, weight 257 pounds, height 67 inches, neck 14 inches.  MEDICATIONS:  Home medications are charted and reviewed.  SLEEP ARCHITECTURE:  Total sleep time 382 minutes with sleep efficiency 87.4%.  Stage I was 9.9%, stage II 58.6%, stage III absent, REM 31.4% of total sleep time.  Sleep latency 35.5 minutes, REM latency 50.5 minutes, awake after sleep onset 19 minutes, arousal index 11.8.  Bedtime medication:  None.  RESPIRATORY DATA:  Apnea-hypopnea index (AHI) 1.7 per hour.  A total of 11 events was scored including 2 obstructive apneas, 1 central apnea, 8 hypopneas.  Events were not positional.  REM AHI 5.0 per hour.  There were insufficient numbers of events to qualify for split protocol CPAP titration on this study night.  OXYGEN DATA:  Mild to moderately loud snoring with oxygen desaturation to a nadir of 87% and mean oxygen saturation through the study of 96.1% on room air.  CARDIAC DATA:  Sinus rhythm with occasional PAC.  MOVEMENT-PARASOMNIA:  No significant movement disturbance.  Bathroom x1.  IMPRESSIONS-RECOMMENDATIONS: 1. Unremarkable sleep architecture for sleep center environment     without medication. 2. Occasional respiratory event with sleep disturbance, within normal     limits.  AHI 1.7 per hour (the normal range for adults is from 0-5     events per hour).  Mild to moderately loud snoring with oxygen     desaturation to a nadir of 87% and mean oxygen saturation through     the study of 96.1% on room  air.     Clinton D. Maple Hudson, MD, Tonny Bollman, FACP Diplomate, American Board of Sleep Medicine   CDY/MEDQ  D:  07/25/2012 17:18:22  T:  07/26/2012 06:56:35  Job:  [id number]

## 2012-09-15 ENCOUNTER — Other Ambulatory Visit: Payer: Self-pay | Admitting: Family Medicine

## 2012-09-15 DIAGNOSIS — R609 Edema, unspecified: Secondary | ICD-10-CM

## 2012-09-15 DIAGNOSIS — R7989 Other specified abnormal findings of blood chemistry: Secondary | ICD-10-CM

## 2012-09-16 ENCOUNTER — Ambulatory Visit
Admission: RE | Admit: 2012-09-16 | Discharge: 2012-09-16 | Disposition: A | Discharge status: Home / Self Care | Payer: 59 | Source: Ambulatory Visit | Attending: Family Medicine | Admitting: Family Medicine

## 2012-09-16 DIAGNOSIS — R7989 Other specified abnormal findings of blood chemistry: Secondary | ICD-10-CM

## 2012-09-16 DIAGNOSIS — R609 Edema, unspecified: Secondary | ICD-10-CM

## 2012-09-18 ENCOUNTER — Encounter (INDEPENDENT_AMBULATORY_CARE_PROVIDER_SITE_OTHER): Payer: Commercial Managed Care - PPO

## 2012-09-25 ENCOUNTER — Ambulatory Visit (INDEPENDENT_AMBULATORY_CARE_PROVIDER_SITE_OTHER): Payer: Commercial Managed Care - PPO | Admitting: Physician Assistant

## 2012-09-25 ENCOUNTER — Encounter (INDEPENDENT_AMBULATORY_CARE_PROVIDER_SITE_OTHER): Payer: Self-pay

## 2012-09-25 VITALS — BP 120/70 | HR 73 | Temp 97.4°F | Resp 18 | Ht 65.5 in | Wt 266.8 lb

## 2012-09-25 DIAGNOSIS — Z4651 Encounter for fitting and adjustment of gastric lap band: Secondary | ICD-10-CM

## 2012-09-25 NOTE — Patient Instructions (Signed)
Take clear liquids tonight. Thin protein shakes are ok to start tomorrow morning. Slowly advance your diet thereafter. Call us if you have persistent vomiting or regurgitation, night cough or reflux symptoms. Return as scheduled or sooner if you notice no changes in hunger/portion sizes.  

## 2012-09-25 NOTE — Progress Notes (Signed)
  HISTORY: Cynthia [name] is a 47 y.o.female who received an AP-Large lap-band in September 2010 by Dr. Daphine Deutscher. She comes in with increasing portion sizes, hunger and weight since her last visit. She denies persistent regurgitation or reflux but she does have it on occasion when she drinks carbonated beverages. Her bigger complaint today is worsening constipation. She describes taking up to 5 dulcolax a day in some cases. She has a bowel movement about once every two weeks. She says miralax has no effect. She has taken senna tea as well without much benefit.  VITAL SIGNS: Filed Vitals:   09/25/12 1035  BP: 120/70  Pulse: 73  Temp: 97.4 F (36.3 C)  Resp: 18    PHYSICAL EXAM: Physical exam reveals a very well-appearing 48 y.o.female in no apparent distress Neurologic: Awake, alert, oriented Psych: Bright affect, conversant Respiratory: Breathing even and unlabored. No stridor or wheezing Abdomen: Soft, nontender, nondistended to palpation. Incisions well-healed. No incisional hernias. Port easily palpated. Extremities: Atraumatic, good range of motion.  ASSESMENT: 47 y.o.  female  s/p AP-Large lap-band.   PLAN: The patient's port was accessed with a 20G Huber needle without difficulty. Clear fluid was aspirated and 0.5 mL saline was added to the port. The patient was able to swallow water without difficulty following the procedure and was instructed to take clear liquids for the next 24-48 hours and advance slowly as tolerated. We will refer her to GI for investigation of her worsening constipation.

## 2012-12-11 ENCOUNTER — Encounter (INDEPENDENT_AMBULATORY_CARE_PROVIDER_SITE_OTHER): Payer: Self-pay

## 2012-12-11 ENCOUNTER — Ambulatory Visit (INDEPENDENT_AMBULATORY_CARE_PROVIDER_SITE_OTHER): Payer: Commercial Managed Care - PPO | Admitting: Physician Assistant

## 2012-12-11 VITALS — BP 112/74 | HR 78 | Temp 97.8°F | Resp 16 | Ht 67.0 in | Wt 268.4 lb

## 2012-12-11 DIAGNOSIS — Z4651 Encounter for fitting and adjustment of gastric lap band: Secondary | ICD-10-CM

## 2012-12-11 NOTE — Patient Instructions (Signed)

## 2012-12-11 NOTE — Progress Notes (Signed)
  HISTORY: Cynthia [name] is a 47 y.o.female who received an AP-Large lap-band in September 2010 by Dr. Daphine Deutscher. She comes in with 1 lb weight gain since her last visit in may. She describes increasing hunger and portion sizes but no regurgitation or reflux. She would like a fill today. Her constipation has improved dramatically since discontinuing drinking [facility] on a daily basis. She is also increasing her water intake and has increased Miralax.  VITAL SIGNS: Filed Vitals:   12/11/12 0842  BP: 112/74  Pulse: 78  Temp: 97.8 F (36.6 C)  Resp: 16    PHYSICAL EXAM: Physical exam reveals a very well-appearing 47 y.o.female in no apparent distress Neurologic: Awake, alert, oriented Psych: Bright affect, conversant Respiratory: Breathing even and unlabored. No stridor or wheezing Abdomen: Soft, nontender, nondistended to palpation. Incisions well-healed. No incisional hernias. Port easily palpated. Extremities: Atraumatic, good range of motion.  ASSESMENT: 47 y.o.  female  s/p AP-Large lap-band.   PLAN: The patient's port was accessed with a 20G Huber needle without difficulty. Clear fluid was aspirated and 0.5 mL saline was added to the port. The patient was able to swallow water without difficulty following the procedure and was instructed to take clear liquids for the next 24-48 hours and advance slowly as tolerated.

## 2013-03-12 ENCOUNTER — Ambulatory Visit (INDEPENDENT_AMBULATORY_CARE_PROVIDER_SITE_OTHER): Payer: Commercial Managed Care - PPO | Admitting: Physician Assistant

## 2013-03-12 ENCOUNTER — Encounter (INDEPENDENT_AMBULATORY_CARE_PROVIDER_SITE_OTHER): Payer: Self-pay

## 2013-03-12 VITALS — BP 123/76 | HR 66 | Temp 97.7°F | Resp 14 | Ht 66.0 in | Wt 269.2 lb

## 2013-03-12 DIAGNOSIS — Z4651 Encounter for fitting and adjustment of gastric lap band: Secondary | ICD-10-CM

## 2013-03-12 NOTE — Progress Notes (Signed)
  HISTORY: Cynthia [name] is a 47 y.o.female who received an AP-Large lap-band in September 2010 by Dr. Daphine Deutscher. She comes in with stable weight since her last visit. She is continuing to learn her limits with the band, primarily her inability to eat fried foods and her problems with eating right after she rises from sleep (she works 3rd shift). Otherwise she still has hunger and is able to eat more than desired. She is avoiding caffeine as it causes constipation.  VITAL SIGNS: Filed Vitals:   03/12/13 0858  BP: 123/76  Pulse: 66  Temp: 97.7 F (36.5 C)  Resp: 14    PHYSICAL EXAM: Physical exam reveals a very well-appearing 47 y.o.female in no apparent distress Neurologic: Awake, alert, oriented Psych: Bright affect, conversant Respiratory: Breathing even and unlabored. No stridor or wheezing Abdomen: Soft, nontender, nondistended to palpation. Incisions well-healed. No incisional hernias. Port easily palpated. Extremities: Atraumatic, good range of motion.  ASSESMENT: 47 y.o.  female  s/p AP-Large lap-band.   PLAN: The patient's port was accessed with a 20G Huber needle without difficulty. Clear fluid was aspirated and 0.5 mL saline was added to the port. The patient was able to swallow water without difficulty following the procedure and was instructed to take clear liquids for the next 24-48 hours and advance slowly as tolerated.

## 2013-03-12 NOTE — Patient Instructions (Signed)

## 2013-06-11 ENCOUNTER — Encounter (INDEPENDENT_AMBULATORY_CARE_PROVIDER_SITE_OTHER): Payer: Commercial Managed Care - PPO

## 2014-05-03 ENCOUNTER — Ambulatory Visit (HOSPITAL_COMMUNITY)
Admission: RE | Admit: 2014-05-03 | Discharge: 2014-05-03 | Disposition: A | Discharge status: Home / Self Care | Payer: 59 | Source: Ambulatory Visit | Attending: Gastroenterology | Admitting: Gastroenterology

## 2014-05-03 ENCOUNTER — Other Ambulatory Visit (HOSPITAL_COMMUNITY): Payer: Self-pay | Admitting: Gastroenterology

## 2014-05-03 ENCOUNTER — Encounter (INDEPENDENT_AMBULATORY_CARE_PROVIDER_SITE_OTHER): Payer: Self-pay

## 2014-05-03 DIAGNOSIS — K5900 Constipation, unspecified: Secondary | ICD-10-CM | POA: Insufficient documentation

## 2014-05-03 DIAGNOSIS — R140 Abdominal distension (gaseous): Secondary | ICD-10-CM | POA: Insufficient documentation

## 2014-05-03 DIAGNOSIS — Z9884 Bariatric surgery status: Secondary | ICD-10-CM | POA: Insufficient documentation

## 2014-05-03 DIAGNOSIS — R109 Unspecified abdominal pain: Secondary | ICD-10-CM | POA: Diagnosis present

## 2014-10-11 LAB — HM COLONOSCOPY

## 2014-11-11 ENCOUNTER — Other Ambulatory Visit: Payer: Self-pay

## 2014-12-29 ENCOUNTER — Other Ambulatory Visit: Payer: Self-pay | Admitting: Physician Assistant

## 2014-12-29 DIAGNOSIS — R609 Edema, unspecified: Secondary | ICD-10-CM

## 2014-12-30 ENCOUNTER — Ambulatory Visit
Admission: RE | Admit: 2014-12-30 | Discharge: 2014-12-30 | Disposition: A | Discharge status: Home / Self Care | Payer: 59 | Source: Ambulatory Visit | Attending: Physician Assistant | Admitting: Physician Assistant

## 2014-12-30 DIAGNOSIS — R609 Edema, unspecified: Secondary | ICD-10-CM

## 2015-07-30 DIAGNOSIS — R918 Other nonspecific abnormal finding of lung field: Secondary | ICD-10-CM | POA: Diagnosis not present

## 2015-07-30 DIAGNOSIS — R531 Weakness: Secondary | ICD-10-CM | POA: Diagnosis not present

## 2015-07-30 DIAGNOSIS — R509 Fever, unspecified: Secondary | ICD-10-CM | POA: Diagnosis not present

## 2015-07-30 DIAGNOSIS — R69 Illness, unspecified: Secondary | ICD-10-CM | POA: Diagnosis not present

## 2015-07-30 DIAGNOSIS — Z91040 Latex allergy status: Secondary | ICD-10-CM | POA: Diagnosis not present

## 2015-07-30 DIAGNOSIS — R404 Transient alteration of awareness: Secondary | ICD-10-CM | POA: Diagnosis not present

## 2015-07-30 DIAGNOSIS — Z880 Allergy status to penicillin: Secondary | ICD-10-CM | POA: Diagnosis not present

## 2015-07-30 DIAGNOSIS — J111 Influenza due to unidentified influenza virus with other respiratory manifestations: Secondary | ICD-10-CM | POA: Diagnosis not present

## 2015-07-30 DIAGNOSIS — R112 Nausea with vomiting, unspecified: Secondary | ICD-10-CM | POA: Diagnosis not present

## 2015-08-01 MED FILL — ONDANSETRON HCL 4 MG TABLET: 4 | 10 days supply | Qty: 30 | Fill #0

## 2015-08-01 MED FILL — OSELTAMIVIR PHOS 75 MG CAP: 75 | 5 days supply | Qty: 10 | Fill #0

## 2015-08-01 MED FILL — BENZONATATE 100 MG CAPSULE: 100 | 10 days supply | Qty: 30 | Fill #0

## 2015-11-17 DIAGNOSIS — Z86718 Personal history of other venous thrombosis and embolism: Secondary | ICD-10-CM | POA: Diagnosis not present

## 2015-11-17 DIAGNOSIS — Z3040 Encounter for surveillance of contraceptives, unspecified: Secondary | ICD-10-CM | POA: Diagnosis not present

## 2015-11-17 DIAGNOSIS — Z01419 Encounter for gynecological examination (general) (routine) without abnormal findings: Secondary | ICD-10-CM | POA: Diagnosis not present

## 2015-11-17 DIAGNOSIS — G4700 Insomnia, unspecified: Secondary | ICD-10-CM | POA: Diagnosis not present

## 2015-11-17 DIAGNOSIS — Z124 Encounter for screening for malignant neoplasm of cervix: Secondary | ICD-10-CM | POA: Diagnosis not present

## 2015-11-17 DIAGNOSIS — E559 Vitamin D deficiency, unspecified: Secondary | ICD-10-CM | POA: Insufficient documentation

## 2015-11-17 DIAGNOSIS — R87612 Low grade squamous intraepithelial lesion on cytologic smear of cervix (LGSIL): Secondary | ICD-10-CM | POA: Diagnosis not present

## 2015-11-17 DIAGNOSIS — I82409 Acute embolism and thrombosis of unspecified deep veins of unspecified lower extremity: Secondary | ICD-10-CM

## 2015-11-17 DIAGNOSIS — K5904 Chronic idiopathic constipation: Secondary | ICD-10-CM | POA: Diagnosis not present

## 2015-11-17 DIAGNOSIS — Z1231 Encounter for screening mammogram for malignant neoplasm of breast: Secondary | ICD-10-CM | POA: Diagnosis not present

## 2015-11-17 DIAGNOSIS — Z113 Encounter for screening for infections with a predominantly sexual mode of transmission: Secondary | ICD-10-CM | POA: Diagnosis not present

## 2015-11-17 DIAGNOSIS — G43909 Migraine, unspecified, not intractable, without status migrainosus: Secondary | ICD-10-CM | POA: Insufficient documentation

## 2015-11-17 HISTORY — DX: Acute embolism and thrombosis of unspecified deep veins of unspecified lower extremity: I82.409

## 2015-11-17 MED FILL — LINZESS 290 MCG CAPSULE: 290 | 90 days supply | Qty: 90 | Fill #0

## 2015-11-17 MED FILL — ZOLPIDEM TARTRATE 10 MG TAB: 10 | 90 days supply | Qty: 90 | Fill #0

## 2015-11-22 DIAGNOSIS — R87612 Low grade squamous intraepithelial lesion on cytologic smear of cervix (LGSIL): Secondary | ICD-10-CM | POA: Insufficient documentation

## 2016-01-27 DIAGNOSIS — K0889 Other specified disorders of teeth and supporting structures: Secondary | ICD-10-CM | POA: Diagnosis not present

## 2016-05-17 DIAGNOSIS — Z4651 Encounter for fitting and adjustment of gastric lap band: Secondary | ICD-10-CM | POA: Diagnosis not present

## 2016-10-01 DIAGNOSIS — R0602 Shortness of breath: Secondary | ICD-10-CM | POA: Diagnosis not present

## 2016-10-01 DIAGNOSIS — M461 Sacroiliitis, not elsewhere classified: Secondary | ICD-10-CM | POA: Diagnosis not present

## 2016-10-01 DIAGNOSIS — R110 Nausea: Secondary | ICD-10-CM | POA: Diagnosis not present

## 2016-10-01 DIAGNOSIS — Z9049 Acquired absence of other specified parts of digestive tract: Secondary | ICD-10-CM | POA: Diagnosis not present

## 2016-10-01 DIAGNOSIS — R1084 Generalized abdominal pain: Secondary | ICD-10-CM | POA: Diagnosis not present

## 2016-10-01 DIAGNOSIS — N200 Calculus of kidney: Secondary | ICD-10-CM | POA: Diagnosis not present

## 2016-10-01 DIAGNOSIS — K5900 Constipation, unspecified: Secondary | ICD-10-CM | POA: Diagnosis not present

## 2016-10-01 DIAGNOSIS — R109 Unspecified abdominal pain: Secondary | ICD-10-CM | POA: Diagnosis not present

## 2016-10-01 DIAGNOSIS — Z7982 Long term (current) use of aspirin: Secondary | ICD-10-CM | POA: Diagnosis not present

## 2016-10-01 DIAGNOSIS — R1031 Right lower quadrant pain: Secondary | ICD-10-CM | POA: Diagnosis not present

## 2016-10-01 DIAGNOSIS — Z9884 Bariatric surgery status: Secondary | ICD-10-CM | POA: Diagnosis not present

## 2016-11-14 DIAGNOSIS — Z4651 Encounter for fitting and adjustment of gastric lap band: Secondary | ICD-10-CM | POA: Diagnosis not present

## 2016-11-14 DIAGNOSIS — R1110 Vomiting, unspecified: Secondary | ICD-10-CM | POA: Diagnosis not present

## 2016-12-03 DIAGNOSIS — K5900 Constipation, unspecified: Secondary | ICD-10-CM | POA: Diagnosis not present

## 2016-12-03 DIAGNOSIS — Z800 Family history of malignant neoplasm of digestive organs: Secondary | ICD-10-CM | POA: Diagnosis not present

## 2016-12-12 DIAGNOSIS — Z6841 Body Mass Index (BMI) 40.0 and over, adult: Secondary | ICD-10-CM | POA: Diagnosis not present

## 2016-12-12 DIAGNOSIS — Z01419 Encounter for gynecological examination (general) (routine) without abnormal findings: Secondary | ICD-10-CM | POA: Diagnosis not present

## 2016-12-12 DIAGNOSIS — Z1231 Encounter for screening mammogram for malignant neoplasm of breast: Secondary | ICD-10-CM | POA: Diagnosis not present

## 2016-12-12 DIAGNOSIS — N951 Menopausal and female climacteric states: Secondary | ICD-10-CM | POA: Diagnosis not present

## 2016-12-12 DIAGNOSIS — N898 Other specified noninflammatory disorders of vagina: Secondary | ICD-10-CM | POA: Diagnosis not present

## 2016-12-12 DIAGNOSIS — Z113 Encounter for screening for infections with a predominantly sexual mode of transmission: Secondary | ICD-10-CM | POA: Diagnosis not present

## 2016-12-12 DIAGNOSIS — Z124 Encounter for screening for malignant neoplasm of cervix: Secondary | ICD-10-CM | POA: Diagnosis not present

## 2016-12-12 LAB — HM MAMMOGRAPHY

## 2016-12-13 LAB — GC/CHLAMYDIA PROBE AMP, GENITAL
Chlamydia Trachomatis RNA: NOT DETECTED
Neisseria Gonorrhoeae RNA: NOT DETECTED

## 2016-12-13 LAB — HM PAP SMEAR
HM Pap smear: NEGATIVE
HM Pap smear: NEGATIVE

## 2017-01-24 DIAGNOSIS — Z4651 Encounter for fitting and adjustment of gastric lap band: Secondary | ICD-10-CM | POA: Diagnosis not present

## 2017-02-07 DIAGNOSIS — H524 Presbyopia: Secondary | ICD-10-CM | POA: Diagnosis not present

## 2017-04-16 DIAGNOSIS — N644 Mastodynia: Secondary | ICD-10-CM | POA: Diagnosis not present

## 2017-04-16 DIAGNOSIS — N6320 Unspecified lump in the left breast, unspecified quadrant: Secondary | ICD-10-CM | POA: Diagnosis not present

## 2017-05-06 DIAGNOSIS — N63 Unspecified lump in unspecified breast: Secondary | ICD-10-CM | POA: Diagnosis not present

## 2017-05-06 LAB — HM MAMMOGRAPHY

## 2017-05-06 MED FILL — CEPHALEXIN 500 MG CAPSULE: 500 | 7 days supply | Qty: 28 | Fill #0

## 2017-05-06 MED FILL — FLUCONAZOLE 150 MG TABLET: 150 | 3 days supply | Qty: 1 | Fill #0

## 2017-06-18 DIAGNOSIS — N649 Disorder of breast, unspecified: Secondary | ICD-10-CM | POA: Insufficient documentation

## 2017-09-18 ENCOUNTER — Ambulatory Visit (INDEPENDENT_AMBULATORY_CARE_PROVIDER_SITE_OTHER): Payer: 59 | Admitting: Physician Assistant

## 2017-09-18 ENCOUNTER — Encounter: Payer: Self-pay | Admitting: Physician Assistant

## 2017-09-18 ENCOUNTER — Ambulatory Visit: Payer: 59

## 2017-09-18 VITALS — BP 119/79 | HR 71 | Wt 274.0 lb

## 2017-09-18 DIAGNOSIS — R600 Localized edema: Secondary | ICD-10-CM | POA: Diagnosis not present

## 2017-09-18 DIAGNOSIS — Z131 Encounter for screening for diabetes mellitus: Secondary | ICD-10-CM | POA: Diagnosis not present

## 2017-09-18 DIAGNOSIS — Z1329 Encounter for screening for other suspected endocrine disorder: Secondary | ICD-10-CM

## 2017-09-18 DIAGNOSIS — Z130 Encounter for screening for diseases of the blood and blood-forming organs and certain disorders involving the immune mechanism: Secondary | ICD-10-CM

## 2017-09-18 DIAGNOSIS — R718 Other abnormality of red blood cells: Secondary | ICD-10-CM

## 2017-09-18 DIAGNOSIS — Z6841 Body Mass Index (BMI) 40.0 and over, adult: Secondary | ICD-10-CM

## 2017-09-18 DIAGNOSIS — M7989 Other specified soft tissue disorders: Secondary | ICD-10-CM

## 2017-09-18 DIAGNOSIS — Z7689 Persons encountering health services in other specified circumstances: Secondary | ICD-10-CM

## 2017-09-18 DIAGNOSIS — D509 Iron deficiency anemia, unspecified: Secondary | ICD-10-CM

## 2017-09-18 DIAGNOSIS — Z86718 Personal history of other venous thrombosis and embolism: Secondary | ICD-10-CM | POA: Insufficient documentation

## 2017-09-18 DIAGNOSIS — Z23 Encounter for immunization: Secondary | ICD-10-CM | POA: Diagnosis not present

## 2017-09-18 DIAGNOSIS — R609 Edema, unspecified: Secondary | ICD-10-CM | POA: Diagnosis not present

## 2017-09-18 DIAGNOSIS — R32 Unspecified urinary incontinence: Secondary | ICD-10-CM | POA: Insufficient documentation

## 2017-09-18 DIAGNOSIS — M79605 Pain in left leg: Secondary | ICD-10-CM | POA: Diagnosis not present

## 2017-09-18 DIAGNOSIS — Z13220 Encounter for screening for lipoid disorders: Secondary | ICD-10-CM

## 2017-09-18 DIAGNOSIS — Z0000 Encounter for general adult medical examination without abnormal findings: Secondary | ICD-10-CM

## 2017-09-18 MED ORDER — AMBULATORY NON FORMULARY MEDICATION: 0 refills | Status: DC

## 2017-09-18 NOTE — Patient Instructions (Addendum)
Physical Activity Recommendations for modifying lipids and lowering blood pressure Engage in aerobic physical activity to reduce LDL-cholesterol, non-HDL-cholesterol, and blood pressure  Frequency: 3-4 sessions per week  Intensity: moderate to vigorous  Duration: 40 minutes on average  Physical Activity Recommendations for secondary prevention 1. Aerobic exercise  Frequency: 3-5 sessions per week  Intensity: 50-80% capacity  Duration: 20 - 60 minutes  Examples: walking, treadmill, cycling, rowing, stair climbing, and arm/leg ergometry  2. Resistance exercise  Frequency: 2-3 sessions per week  Intensity: 10-15 repetitions/set to moderate fatigue  Duration: 1-3 sets of 8-10 upper and lower body exercises  Examples: calisthenics, elastic bands, cuff/hand weights, dumbbels, free weights, wall pulleys, and weight machines  Heart-Healthy Lifestyle  Eating a diet rich in vegetables, fruits and whole grains: also includes low-fat dairy products, poultry, fish, legumes, and nuts; limit intake of sweets, sugar-sweetened beverages and red meats  Getting regular exercise  Maintaining a healthy weight  Not smoking or getting help quitting  Staying on top of your health; for some people, lifestyle changes alone may not be enough to prevent a heart attack or stroke. In these cases, taking a statin at the right dose will most likely be necessary   How to Use Compression Stockings Compression stockings are elastic socks that squeeze the legs. They help to increase blood flow to the legs, decrease swelling in the legs, and reduce the chance of developing blood clots in the lower legs. Compression stockings are often used by people who:  Are recovering from surgery.  Have poor circulation in their legs.  Are prone to getting blood clots in their legs.  Have varicose veins.  Sit or stay in bed for long periods of time.  How to use compression stockings Before you put on your  compression stockings:  Make sure that they are the correct size. If you do not know your size, ask your health care provider.  Make sure that they are clean, dry, and in good condition.  Check them for rips and tears. Do not put them on if they are ripped or torn.  Put your stockings on first thing in the morning, before you get out of bed. Keep them on for as long as your health care provider advises. When you are wearing your stockings:  Keep them as smooth as possible. Do not allow them to bunch up. It is especially important to prevent the stockings from bunching up around your toes or behind your knees.  Do not roll the stockings downward and leave them rolled down. This can decrease blood flow to your leg.  Change them right away if they become wet or dirty.  When you take off your stockings, inspect your legs and feet. Anything that does not seem normal may require medical attention. Look for:  Open sores.  Red spots.  Swelling.  Information and tips  Do not stop wearing your compression stockings without talking to your health care provider first.  Wash your stockings every day with mild detergent in cold or warm water. Do not use bleach. Air-dry your stockings or dry them in a clothes dryer on low heat.  Replace your stockings every 3-6 months.  If skin moisturizing is part of your treatment plan, apply lotion or cream at night so that your skin will be dry when you put on the stockings in the morning. It is harder to put the stockings on when you have lotion on your legs or feet. Contact a health care provider  if: Remove your stockings and seek medical care if:  You have a feeling of pins and needles in your feet or legs.  You have any new changes in your skin.  You have skin lesions that are getting worse.  You have swelling or pain that is getting worse.  Get help right away if:  You have numbness or tingling in your lower legs that does not get better right  after you take the stockings off.  Your toes or feet become cold and blue.  You develop open sores or red spots on your legs that do not go away.  You see or feel a warm spot on your leg.  You have new swelling or soreness in your leg.  You are short of breath or you have chest pain for no reason.  You have a rapid or irregular heartbeat.  You feel light-headed or dizzy. This information is not intended to replace advice given to you by your health care provider. Make sure you discuss any questions you have with your health care provider. Document Released: 03/04/2009 Document Revised: 10/05/2015 Document Reviewed: 04/14/2014 Elsevier Interactive Patient Education  2018 [employer].  Chronic Venous Insufficiency Chronic venous insufficiency, also called venous stasis, is a condition that prevents blood from being pumped effectively through the veins in your legs. Blood may no longer be pumped effectively from the legs back to the heart. This condition can range from mild to severe. With proper treatment, you should be able to continue with an active life. What are the causes? Chronic venous insufficiency occurs when the vein walls become stretched, weakened, or damaged, or when valves within the vein are damaged. Some common causes of this include:  High blood pressure inside the veins (venous hypertension).  Increased blood pressure in the leg veins from long periods of sitting or standing.  A blood clot that blocks blood flow in a vein (deep vein thrombosis, DVT).  Inflammation of a vein (phlebitis) that causes a blood clot to form.  Tumors in the pelvis that cause blood to back up.  What increases the risk? The following factors may make you more likely to develop this condition:  Having a family history of this condition.  Obesity.  Pregnancy.  Living without enough physical activity or exercise (sedentary lifestyle).  Smoking.  Having a job that requires long  periods of standing or sitting in one place.  Being a certain age. Women in their 60s and 58s and men in their 40s are more likely to develop this condition.  What are the signs or symptoms? Symptoms of this condition include:  Veins that are enlarged, bulging, or twisted (varicose veins).  Skin breakdown or ulcers.  Reddened or discolored skin on the front of the leg.  Brown, smooth, tight, and painful skin just above the ankle, usually on the inside of the leg (lipodermatosclerosis).  Swelling.  How is this diagnosed? This condition may be diagnosed based on:  Your medical history.  A physical exam.  Tests, such as: ? A procedure that creates an image of a blood vessel and nearby organs and provides information about blood flow through the blood vessel (duplex ultrasound). ? A procedure that tests blood flow (plethysmography). ? A procedure to look at the veins using X-ray and dye (venogram).  How is this treated? The goals of treatment are to help you return to an active life and to minimize pain or disability. Treatment depends on the severity of your condition, and it  may include:  Wearing compression stockings. These can help relieve symptoms and help prevent your condition from getting worse. However, they do not cure the condition.  Sclerotherapy. This is a procedure involving an injection of a material that "dissolves" damaged veins.  Surgery. This may involve: ? Removing a diseased vein (vein stripping). ? Cutting off blood flow through the vein (laser ablation surgery). ? Repairing a valve.  Follow these instructions at home:  Wear compression stockings as told by your health care provider. These stockings help to prevent blood clots and reduce swelling in your legs.  Take over-the-counter and prescription medicines only as told by your health care provider.  Stay active by exercising, walking, or doing different activities. Ask your health care provider what  activities are safe for you and how much exercise you need.  Drink enough fluid to keep your urine clear or pale yellow.  Do not use any products that contain nicotine or tobacco, such as cigarettes and e-cigarettes. If you need help quitting, ask your health care provider.  Keep all follow-up visits as told by your health care provider. This is important. Contact a health care provider if:  You have redness, swelling, or more pain in the affected area.  You see a red streak or line that extends up or down from the affected area.  You have skin breakdown or a loss of skin in the affected area, even if the breakdown is small.  You get an injury in the affected area. Get help right away if:  You get an injury and an open wound in the affected area.  You have severe pain that does not get better with medicine.  You have sudden numbness or weakness in the foot or ankle below the affected area, or you have trouble moving your foot or ankle.  You have a fever and you have worse or persistent symptoms.  You have chest pain.  You have shortness of breath. Summary  Chronic venous insufficiency, also called venous stasis, is a condition that prevents blood from being pumped effectively through the veins in your legs.  Chronic venous insufficiency occurs when the vein walls become stretched, weakened, or damaged, or when valves within the vein are damaged.  Treatment for this condition depends on how severe your condition is, and it may involve wearing compression stockings or having a procedure.  Make sure you stay active by exercising, walking, or doing different activities. Ask your health care provider what activities are safe for you and how much exercise you need. This information is not intended to replace advice given to you by your health care provider. Make sure you discuss any questions you have with your health care provider. Document Released: 09/10/2006 Document Revised:  03/26/2016 Document Reviewed: 03/26/2016 Elsevier Interactive Patient Education  2017 [employer].

## 2017-09-18 NOTE — Progress Notes (Signed)
HPI:                                                                Cynthia [name] is a 52 y.o. female who presents to [facility] today to establish care  Current concerns: requesting annual physical exam, left leg swelling  Patient reports history of an acute DVT in her left leg approximately 20 years ago. It was thought to have been provoked by OCP's. She has been taking aspirin 325 mg since completing warfarin. Reports her left leg has always been slightly more swollen than the right, but for the last month she has developed worsening and persistent swelling along with aching in the calf. Reports at times her leg has swollen to the point of weeping clear fluid. Endorses left knee pain. 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 Denies known injury or trauma.   Depression screen PHQ 2/9 09/18/2017  Decreased Interest 0  Down, Depressed, Hopeless 0  PHQ - 2 Score 0    No flowsheet data found.    Past Medical History:  Diagnosis Date  . Depression   . History of DVT (deep vein thrombosis)   . Sleep difficulties    Past Surgical History:  Procedure Laterality Date  . COLONOSCOPY  2014  . GASTRIC RESTRICTION SURGERY     lap band  . HERNIA REPAIR     Social History   Tobacco Use  . Smoking status: Never Smoker  . Smokeless tobacco: Never Used  Substance Use Topics  . Alcohol use: No   family history includes Alcohol abuse in her father; Heart attack in her father; Heart disease in her father; Hypertension in her mother; Prostate cancer in her father; Stroke in her mother; Uterine cancer in her maternal grandmother.    ROS: negative except as noted in the HPI  Medications: Current Outpatient Medications  Medication Sig Dispense Refill  . zolpidem (AMBIEN) 5 MG tablet Take 5 mg by mouth at bedtime as needed. For sleep     . acetaminophen (TYLENOL) 325 MG tablet Take 650 mg by mouth every 6 (six) hours as needed.  For pain     . aspirin 325 MG tablet Take 325 mg by mouth daily.      . calcium carbonate (TUMS - DOSED IN MG ELEMENTAL CALCIUM) 500 MG chewable tablet Chew 1 tablet by mouth 3 (three) times daily as needed. For heartburn     . ranitidine (ZANTAC) 150 MG capsule Take 150 mg by mouth 2 (two) times daily.     No current facility-administered medications for this visit.    Allergies  Allergen Reactions  . Penicillin G Itching  . Latex Rash    Where touched and will spread.       Objective:  BP 119/79   Pulse 71   Wt 274 lb (124.3 kg)   LMP 04/24/2014   BMI 44.22 kg/m  Gen:  alert, not ill-appearing, no distress, appropriate for age, obese female HEENT: head normocephalic without obvious abnormality, conjunctiva and cornea clear, trachea midline Pulm: Normal work of breathing, normal phonation, clear to auscultation bilaterally, no wheezes, rales or rhonchi CV: Normal rate, regular rhythm, s1 and s2 distinct, no murmurs, clicks or rubs  Neuro: alert and oriented x 3, no tremor MSK: left lower extremity significantly swollen compared to right, there is 2+ pitting edema bilaterally, no calf tenderness, brisk capillary refill, DP pulses 2+ symmetric, unable to appreciate left PT pulse due to ankle edema, normal gait and station Skin: warm, dry, intact; hypopigmentation of the medial left ankle Psych: well-groomed, cooperative, good eye contact, euthymic mood, affect mood-congruent, speech is articulate, and thought processes clear and goal-directed    No results found for this or any previous visit (from the past 72 hour(s)). No results found.    Assessment and Plan: 52 y.o. female with   Encounter for annual physical exam - Plan: CBC, Comprehensive metabolic panel, Hemoglobin A1c, Lipid Panel w/reflex Direct LDL, TSH + free T4  History of DVT (deep vein thrombosis) - Plan: VAS [place] LOWER EXTREMITY VENOUS REFLUX, US Venous Img Lower Unilateral Left  Pain and swelling of left  lower extremity - Plan: VAS [place] LOWER EXTREMITY VENOUS REFLUX, US Venous Img Lower Unilateral Left  Screening for lipid disorders - Plan: Lipid Panel w/reflex Direct LDL  Screening for blood disease - Plan: CBC, Comprehensive metabolic panel  Screening for diabetes mellitus - Plan: Hemoglobin A1c  Screening for thyroid disorder - Plan: TSH + free T4  Morbid obesity with BMI of 40.0-44.9, adult (HCC) - Plan: Hemoglobin A1c, Lipid Panel w/reflex Direct LDL  Encounter to establish care  Peripheral edema - Plan: AMBULATORY NON FORMULARY MEDICATION  Need for Tdap vaccination - Plan: Tdap vaccine greater than or equal to 7yo IM  - Personally reviewed PMH, PSH, PFH, medications, allergies, HM - Age-appropriate cancer screening: Pap smear, mammogram and colonoscopy UTD per patient, requesting outside records - Influenza n/a - Tdap due - PHQ2 negative  Pain and swelling of left lower extremity - suspect this is some underlying venous insufficiency and dependent edema from OA and standing. Stat doppler today to r/o acute or chronic DVT. Venous reflux [place] to assess competence of valves. Compression stockings.    Patient education and anticipatory guidance given Patient agrees with treatment plan Follow-up in 1-4 weeks to discuss weight loss as needed if symptoms worsen or fail to improve  Darlyne Russian PA-C

## 2017-09-18 NOTE — Progress Notes (Signed)
Spoke with patient on the phone at 4:37 pm Doppler negative for acute or chronic DVT Treatment plan does not change Proceed with venous reflux study, compression stockings, elevate

## 2017-09-19 ENCOUNTER — Encounter: Payer: Self-pay | Admitting: Physician Assistant

## 2017-09-19 DIAGNOSIS — M170 Bilateral primary osteoarthritis of knee: Secondary | ICD-10-CM | POA: Insufficient documentation

## 2017-09-19 DIAGNOSIS — M1712 Unilateral primary osteoarthritis, left knee: Secondary | ICD-10-CM | POA: Insufficient documentation

## 2017-09-19 DIAGNOSIS — R609 Edema, unspecified: Secondary | ICD-10-CM | POA: Insufficient documentation

## 2017-09-24 ENCOUNTER — Ambulatory Visit (HOSPITAL_COMMUNITY)
Admission: RE | Admit: 2017-09-24 | Discharge: 2017-09-24 | Disposition: A | Discharge status: Home / Self Care | Payer: 59 | Source: Ambulatory Visit | Attending: Internal Medicine | Admitting: Internal Medicine

## 2017-09-24 ENCOUNTER — Encounter: Payer: Self-pay | Admitting: Physician Assistant

## 2017-09-24 DIAGNOSIS — I878 Other specified disorders of veins: Secondary | ICD-10-CM | POA: Insufficient documentation

## 2017-09-24 DIAGNOSIS — Z86718 Personal history of other venous thrombosis and embolism: Secondary | ICD-10-CM | POA: Diagnosis not present

## 2017-09-24 DIAGNOSIS — M79605 Pain in left leg: Secondary | ICD-10-CM | POA: Insufficient documentation

## 2017-09-24 DIAGNOSIS — M7989 Other specified soft tissue disorders: Secondary | ICD-10-CM | POA: Diagnosis not present

## 2017-09-29 ENCOUNTER — Encounter: Payer: Self-pay | Admitting: Physician Assistant

## 2017-09-29 DIAGNOSIS — I872 Venous insufficiency (chronic) (peripheral): Secondary | ICD-10-CM

## 2017-09-29 DIAGNOSIS — I890 Lymphedema, not elsewhere classified: Secondary | ICD-10-CM | POA: Insufficient documentation

## 2017-09-29 HISTORY — DX: Venous insufficiency (chronic) (peripheral): I87.2

## 2017-09-29 NOTE — Progress Notes (Signed)
Cynthia [name],  Your reflux study shows chronic venous insufficiency in the your left leg. This is the cause for the swelling. The general recommendations for treating this are: Compression stockings Avoid prolonged standing or sitting Elevate the feet above the thighs when sitting and above the heart when lying down three to four times a day if possible to reduce swelling Structured exercise such as walking to strengthen calf muscles may improve calf muscle function  I can also refer you to a vascular surgeon to discuss medical and surgical treatments further. Let me know if you would like to do that.  Best, Evlyn Clines

## 2017-09-30 ENCOUNTER — Other Ambulatory Visit: Payer: Self-pay | Admitting: Physician Assistant

## 2017-09-30 DIAGNOSIS — I872 Venous insufficiency (chronic) (peripheral): Secondary | ICD-10-CM

## 2017-09-30 NOTE — Progress Notes (Signed)
re

## 2017-10-04 ENCOUNTER — Encounter: Payer: Self-pay | Admitting: Physician Assistant

## 2017-10-04 ENCOUNTER — Ambulatory Visit (INDEPENDENT_AMBULATORY_CARE_PROVIDER_SITE_OTHER): Payer: 59

## 2017-10-04 ENCOUNTER — Ambulatory Visit (INDEPENDENT_AMBULATORY_CARE_PROVIDER_SITE_OTHER): Payer: 59 | Admitting: Physician Assistant

## 2017-10-04 VITALS — BP 113/78 | HR 71 | Wt 281.0 lb

## 2017-10-04 DIAGNOSIS — M533 Sacrococcygeal disorders, not elsewhere classified: Secondary | ICD-10-CM | POA: Diagnosis not present

## 2017-10-04 DIAGNOSIS — M545 Low back pain: Secondary | ICD-10-CM | POA: Diagnosis not present

## 2017-10-04 DIAGNOSIS — M5417 Radiculopathy, lumbosacral region: Secondary | ICD-10-CM | POA: Diagnosis not present

## 2017-10-04 DIAGNOSIS — M5410 Radiculopathy, site unspecified: Secondary | ICD-10-CM

## 2017-10-04 MED ORDER — CYCLOBENZAPRINE HCL 10 MG PO TABS: 10.0000 mg | ORAL_TABLET | Freq: Every evening | ORAL | 0 refills | Status: DC | PRN

## 2017-10-04 MED ORDER — IBUPROFEN 800 MG PO TABS: 800.0000 mg | ORAL_TABLET | Freq: Three times a day (TID) | ORAL | 0 refills | Status: DC | PRN

## 2017-10-04 NOTE — Patient Instructions (Signed)
Sacroiliac Joint Dysfunction Sacroiliac joint dysfunction is a condition that causes inflammation on one or both sides of the sacroiliac (SI) joint. The SI joint connects the lower part of the spine (sacrum) with the two upper portions of the pelvis (ilium). This condition causes deep aching or burning pain in the low back. In some cases, the pain may also spread into one or both buttocks or hips or spread down the legs. What are the causes? This condition may be caused by:  Pregnancy. During pregnancy, extra stress is put on the SI joints because the pelvis widens.  Injury, such as: ? Car accidents. ? Sport-related injuries. ? Work-related injuries.  Having one leg that is shorter than the other.  Conditions that affect the joints, such as: ? Rheumatoid arthritis. ? Gout. ? Psoriatic arthritis. ? Joint infection (septic arthritis).  Sometimes, the cause of SI joint dysfunction is not known. What are the signs or symptoms? Symptoms of this condition include:  Aching or burning pain in the lower back. The pain may also spread to other areas, such as: ? Buttocks. ? Groin. ? Thighs and legs.  Muscle spasms in or around the painful areas.  Increased pain when standing, walking, running, stair climbing, bending, or lifting.  How is this diagnosed? Your health care provider will do a physical exam and take your medical history. During the exam, the health care provider may move one or both of your legs to different positions to check for pain. Various tests may be done to help verify the diagnosis, including:  Imaging tests to look for other causes of pain. These may include: ? MRI. ? CT scan. ? Bone scan.  Diagnostic injection. A numbing medicine is injected into the SI joint using a needle. If the pain is temporarily improved or stopped after the injection, this can indicate that SI joint dysfunction is the problem.  How is this treated? Treatment may vary depending on the  cause and severity of your condition. Treatment options may include:  Applying ice or heat to the lower back area. This can help to reduce pain and muscle spasms.  Medicines to relieve pain or inflammation or to relax the muscles.  Wearing a back brace (sacroiliac brace) to help support the joint while your back is healing.  Physical therapy to increase muscle strength around the joint and flexibility at the joint. This may also involve learning proper body positions and ways of moving to relieve stress on the joint.  Direct manipulation of the SI joint.  Injections of steroid medicine into the joint in order to reduce pain and swelling.  Radiofrequency ablation to burn away nerves that are carrying pain messages from the joint.  Use of a device that provides electrical stimulation in order to reduce pain at the joint.  Surgery to put in screws and plates that limit or prevent joint motion. This is rare.  Follow these instructions at home:  Rest as needed. Limit your activities as directed by your health care provider.  Take medicines only as directed by your health care provider.  If directed, apply ice to the affected area: ? Put ice in a plastic bag. ? Place a towel between your skin and the bag. ? Leave the ice on for 20 minutes, 2-3 times per day.  Use a heating pad or a moist heat pack as directed by your health care provider.  Exercise as directed by your health care provider or physical therapist.  Keep all follow-up visits   as directed by your health care provider. This is important. Contact a health care provider if:  Your pain is not controlled with medicine.  You have a fever.  You have increasingly severe pain. Get help right away if:  You have weakness, numbness, or tingling in your legs or feet.  You lose control of your bladder or bowel. This information is not intended to replace advice given to you by your health care provider. Make sure you discuss  any questions you have with your health care provider. Document Released: 08/03/2008 Document Revised: 10/13/2015 Document Reviewed: 01/12/2014 Elsevier Interactive Patient Education  2018 [employer]  

## 2017-10-04 NOTE — Progress Notes (Signed)
HPI:                                                                Cynthia [name] is a 52 y.o. female who presents to [place]: [facility] today for back pain  Reports 1 month of b/l low back and buttock pain, worse on the left associated with numbness of the posterior upper thighs. Denies known injury/trauma. Denies constitutional symptoms, bowel/bladder dysfunction, or saddle numbness. Pain is worse and more constant in her buttocks and more intermittent in her low back. Worse with prolonged sitting and climbing stairs Relieved by taking pressure off of her butt Has not tried any treatments   Depression screen [facility] 2/9 09/18/2017  Decreased Interest 0  Down, Depressed, Hopeless 0  PHQ - 2 Score 0    No flowsheet data found.    Past Medical History:  Diagnosis Date  . Chronic venous insufficiency 09/29/2017  . Deep venous thrombosis of lower extremity ([place]) 11/17/2015  . Depression   . History of DVT (deep vein thrombosis)   . Sleep difficulties    Past Surgical History:  Procedure Laterality Date  . COLONOSCOPY  2014  . GASTRIC RESTRICTION SURGERY     lap band  . HERNIA REPAIR     Social History   Tobacco Use  . Smoking status: Never Smoker  . Smokeless tobacco: Never Used  Substance Use Topics  . Alcohol use: No   family history includes Alcohol abuse in her father; Heart attack in her father; Heart disease in her father; Hypertension in her mother; Prostate cancer in her father; Stroke in her mother; Uterine cancer in her maternal grandmother.    ROS: negative except as noted in the HPI  Medications: Current Outpatient Medications  Medication Sig Dispense Refill  . AMBULATORY NON FORMULARY MEDICATION Knee-high, medium compression, graduated compression stockings. Apply to bilateral lower extremities daily 1 each 0  . aspirin EC 81 MG tablet Take 81 mg by mouth daily.    Marland Kitchen ibuprofen (ADVIL,MOTRIN) 600 MG tablet  Take 600 mg by mouth every 6 (six) hours as needed.    . linaclotide (LINZESS) 290 MCG CAPS capsule Take 290 mcg by mouth daily before breakfast.    . Multiple Vitamins-Minerals (MULTIVITAMIN WITH MINERALS) tablet Take 1 tablet by mouth daily.    Marland Kitchen zolpidem (AMBIEN) 5 MG tablet Take 5 mg by mouth at bedtime as needed. For sleep      No current facility-administered medications for this visit.    Allergies  Allergen Reactions  . Penicillin G Itching  . Latex Rash    Where touched and will spread.       Objective:  BP 113/78   Pulse 71   Wt 281 lb (127.5 kg)   LMP 04/24/2014   BMI 45.35 kg/m  Gen:  alert, not ill-appearing, no distress, appropriate for age, obese female HEENT: head normocephalic without obvious abnormality, conjunctiva and cornea clear, wearing glasses, trachea midline Pulm: Normal work of breathing, normal phonation,  Neuro: alert and oriented x 3, no tremor MSK: extremities atraumatic, , strength 5/5 and symmetric in bilateral lower extremities, there is assymetric lower extremity edema L>R, normal gait and station Back: atraumatic, no midline tenderness, there is tenderness over the  right SI joint Skin: intact, no rashes on exposed skin, no jaundice, no cyanosis Psych: well-groomed, cooperative, good eye contact, euthymic mood, affect mood-congruent, speech is articulate, and thought processes clear and goal-directed    No results found for this or any previous visit (from the past 72 hour(s)). No results found.    Assessment and Plan: 52 y.o. female with  Acute low back pain with radicular symptoms, duration less than 6 weeks - Plan: DG Lumbar Spine Complete, DG Sacrum/Coccyx  Sacroiliac joint dysfunction of both sides - Plan: DG Lumbar Spine Complete, DG Sacrum/Coccyx  - no red flags, reassuring exam, suspect underlying SI joint dysfunction and degenerative disease of the spine. X-rays pending - conservative management with NSAID, rehab exercises,  ice/heat. Muscle relaxer at bedtime for nocturnal symptoms/discomfort - weight loss will also help with this. She was 10 minutes late for her appointment today, so we will need additional appointment time to address  Patient education and anticipatory guidance given Patient agrees with treatment plan Follow-up in 2 weeks to discuss weight loss or sooner as needed if symptoms worsen or fail to improve  Darlyne Russian PA-C

## 2017-10-07 ENCOUNTER — Encounter: Payer: Self-pay | Admitting: Physician Assistant

## 2017-10-07 ENCOUNTER — Other Ambulatory Visit: Payer: Self-pay | Admitting: Physician Assistant

## 2017-10-07 DIAGNOSIS — M533 Sacrococcygeal disorders, not elsewhere classified: Secondary | ICD-10-CM

## 2017-10-07 DIAGNOSIS — M51369 Other intervertebral disc degeneration, lumbar region without mention of lumbar back pain or lower extremity pain: Secondary | ICD-10-CM

## 2017-10-07 DIAGNOSIS — M5136 Other intervertebral disc degeneration, lumbar region: Secondary | ICD-10-CM

## 2017-10-07 NOTE — Progress Notes (Signed)
Good morning Cynthia [name],  As expected, your x-ray show some degenerative/arthritic changes in your lumbar spina and sacro-iliac joints. Treatment plan is the same, however,  I am going to refer you to formal physical therapy. I would like you to do 6 weeks of therapy, and if you are not improving follow-up with sports medicine.  Best, Evlyn Clines

## 2017-10-07 NOTE — Progress Notes (Signed)
re

## 2017-10-21 ENCOUNTER — Telehealth: Payer: Self-pay | Admitting: Physician Assistant

## 2017-10-21 ENCOUNTER — Ambulatory Visit (INDEPENDENT_AMBULATORY_CARE_PROVIDER_SITE_OTHER): Payer: 59 | Admitting: Physician Assistant

## 2017-10-21 ENCOUNTER — Encounter: Payer: Self-pay | Admitting: Physician Assistant

## 2017-10-21 VITALS — BP 95/60 | HR 56 | Wt 279.0 lb

## 2017-10-21 DIAGNOSIS — Z6841 Body Mass Index (BMI) 40.0 and over, adult: Secondary | ICD-10-CM

## 2017-10-21 DIAGNOSIS — R635 Abnormal weight gain: Secondary | ICD-10-CM

## 2017-10-21 DIAGNOSIS — M5136 Other intervertebral disc degeneration, lumbar region: Secondary | ICD-10-CM

## 2017-10-21 DIAGNOSIS — M533 Sacrococcygeal disorders, not elsewhere classified: Secondary | ICD-10-CM

## 2017-10-21 DIAGNOSIS — M51369 Other intervertebral disc degeneration, lumbar region without mention of lumbar back pain or lower extremity pain: Secondary | ICD-10-CM

## 2017-10-21 DIAGNOSIS — E66813 Obesity, class 3: Secondary | ICD-10-CM

## 2017-10-21 MED ORDER — OMEPRAZOLE 20 MG PO CPDR: 20.0000 mg | DELAYED_RELEASE_CAPSULE | Freq: Every day | ORAL | 11 refills | Status: DC

## 2017-10-21 MED ORDER — BUPROPION HCL ER (XL) 150 MG PO TB24: 150.0000 mg | ORAL_TABLET | Freq: Every day | ORAL | 0 refills | Status: DC

## 2017-10-21 MED FILL — buPROPion HCL ER (XL) 150 M: 150 | 90 days supply | Qty: 90 | Fill #0

## 2017-10-21 MED FILL — OMEPRAZOLE 20 MG CAP: 20 | 30 days supply | Qty: 30 | Fill #0

## 2017-10-21 NOTE — Progress Notes (Signed)
HPI:                                                                Cynthia [name] is a 52 y.o. female who presents to [place]: [place] today for back pain follow-up  This is a pleasant 52 yo F with approx 6 weeks of bilateral low back and buttock pain. She had lumbar and sacrum x-rays which showed degenerative changes in the SI joint and L4-L5, L5-S1.  Pain has improved. It is still moderate, persistent, worse with sitting, improves with Ibuprofen. She is taking Ibuprofen Q8H and Flexeril bid. She has not yet scheduled with PT because she was away on vacation.  Weight gain: s/p lap band in 2010. Current weight 279 lb, for the last 3 months. Reports lowest weight was 258 lb. Highest adult weight was 388 lb prior to lap band. Has noted gradual weight gain since marriage in 2017. Has been taking husband's Wellbutrin XL 150 mg for the last 4 weeks and states it helps her with appetite control and concentration.   Depression screen PHQ 2/9 09/18/2017  Decreased Interest 0  Down, Depressed, Hopeless 0  PHQ - 2 Score 0    No flowsheet data found.    Past Medical History:  Diagnosis Date  . Chronic venous insufficiency 09/29/2017  . Deep venous thrombosis of lower extremity ([place]) 11/17/2015  . Depression   . History of DVT (deep vein thrombosis)   . Sleep difficulties    Past Surgical History:  Procedure Laterality Date  . COLONOSCOPY  2014  . GASTRIC RESTRICTION SURGERY     lap band  . HERNIA REPAIR     Social History   Tobacco Use  . Smoking status: Never Smoker  . Smokeless tobacco: Never Used  Substance Use Topics  . Alcohol use: No   family history includes Alcohol abuse in her father; Heart attack in her father; Heart disease in her father; Hypertension in her mother; Prostate cancer in her father; Stroke in her mother; Uterine cancer in her maternal grandmother.    ROS: negative except as noted in the  HPI  Medications: Current Outpatient Medications  Medication Sig Dispense Refill  . AMBULATORY NON FORMULARY MEDICATION Knee-high, medium compression, graduated compression stockings. Apply to bilateral lower extremities daily 1 each 0  . aspirin EC 81 MG tablet Take 81 mg by mouth daily.    . cyclobenzaprine (FLEXERIL) 10 MG tablet Take 1 tablet (10 mg total) by mouth at bedtime as needed for muscle spasms. 30 tablet 0  . ibuprofen (ADVIL,MOTRIN) 800 MG tablet Take 1 tablet (800 mg total) by mouth every 8 (eight) hours as needed for moderate pain. 60 tablet 0  . linaclotide (LINZESS) 290 MCG CAPS capsule Take 290 mcg by mouth daily before breakfast.    . Multiple Vitamins-Minerals (MULTIVITAMIN WITH MINERALS) tablet Take 1 tablet by mouth daily.    Marland Kitchen zolpidem (AMBIEN) 5 MG tablet Take 5 mg by mouth at bedtime as needed. For sleep      No current facility-administered medications for this visit.    Allergies  Allergen Reactions  . Penicillin G Itching  . Latex Rash    Where touched and will spread.  Objective:  BP 95/60   Pulse (!) 56   Wt 279 lb (126.6 kg)   LMP 04/24/2014   BMI 45.03 kg/m  Gen:  alert, not ill-appearing, no distress, appropriate for age, obese female HEENT: head normocephalic without obvious abnormality, conjunctiva and cornea clear, trachea midline Pulm: Normal work of breathing, normal phonation Neuro: alert and oriented x 3, no tremor MSK: extremities atraumatic, normal gait and station Skin: intact, no rashes on exposed skin, no jaundice, no cyanosis Psych: well-groomed, cooperative, good eye contact, euthymic mood, affect mood-congruent, speech is articulate, and thought processes clear and goal-directed  CLINICAL DATA:  Low back pain w/numbness, tingling in both buttocks and legs x75mo. Hx of lap band surg 1yrs ago.  EXAM: LUMBAR SPINE - COMPLETE 4+ VIEW  COMPARISON:  None.  FINDINGS: Mild disc desiccations at the L4-5 and L5-S1  levels, as manifested by slight disc space narrowings and minimal osseous spurring. Additional degenerative facet hypertrophy at the L3-4 through L5-S1 levels, at least moderate in degree with suspected osseous neural foramen encroachment at multiple levels.  No acute or suspicious osseous lesion. No fracture line or displaced fracture fragment. No evidence of pars interarticularis defect. Visualized paravertebral soft tissues are unremarkable.  IMPRESSION: 1. No acute findings. 2. Degenerative changes within the mid and lower lumbar spine, as detailed above. Suspect associated osseous neural foramen encroachment at 1 or more levels. Would consider nonemergent lumbar spine MRI to exclude associated nerve root impingement.   Electronically Signed   By: Franki Cabot M.D.   On: 10/04/2017 19:38  CLINICAL DATA:  Acute sacral and coccyx pain for 1 month. Initial encounter.  EXAM: SACRUM AND COCCYX - 2+ VIEW  COMPARISON:  05/03/2014 radiographs  FINDINGS: No fracture or focal bony lesions.  Mild degenerative changes in the SI joints noted.  Degenerative changes in the LOWER lumbar spine again noted.  IMPRESSION: Unremarkable sacrum/coccyx.  Degenerative changes in the LOWER lumbar spine.   Electronically Signed   By: Margarette Canada M.D.   On: 10/04/2017 19:43  No results found for this or any previous visit (from the past 72 hour(s)). No results found.    Assessment and Plan: 52 y.o. female with   Sacroiliac joint dysfunction of both sides  Lumbar degenerative disc disease  Class 3 severe obesity due to excess calories without serious comorbidity with body mass index (BMI) of 45.0 to 49.9 in adult [facility]) - Plan: buPROPion (WELLBUTRIN XL) 150 MG 24 hr tablet, Amb Referral to Bariatric Surgery  Abnormal weight gain  Class 3 obesity, BMI 45 Wt Readings from Last 3 Encounters:  10/21/17 279 lb (126.6 kg)  10/04/17 281 lb (127.5 kg)  09/18/17 274 lb  (124.3 kg)  - s/p lap band 2010. gradual weight gain of approximately 20 pounds over the last 2 years. She is interested in following up with her bariatric surgeon, Dr. Johnathan Hausen. Referral placed - refilling Wellbutrin XL for appetite   SI joint dysfunction, Lumbar DDD - counseled on degernative nature of condition. Mainstay of treatment will be formal PT and weight loss - recommend starting PPI prophylaxis due to daily NSAID use   Patient education and anticipatory guidance given Patient agrees with treatment plan Follow-up as needed if symptoms worsen or fail to improve  Darlyne Russian PA-C

## 2017-10-21 NOTE — Patient Instructions (Addendum)
Sacroiliac Joint Dysfunction Sacroiliac joint dysfunction is a condition that causes inflammation on one or both sides of the sacroiliac (SI) joint. The SI joint connects the lower part of the spine (sacrum) with the two upper portions of the pelvis (ilium). This condition causes deep aching or burning pain in the low back. In some cases, the pain may also spread into one or both buttocks or hips or spread down the legs. What are the causes? This condition may be caused by:  Pregnancy. During pregnancy, extra stress is put on the SI joints because the pelvis widens.  Injury, such as: ? Car accidents. ? Sport-related injuries. ? Work-related injuries.  Having one leg that is shorter than the other.  Conditions that affect the joints, such as: ? Rheumatoid arthritis. ? Gout. ? Psoriatic arthritis. ? Joint infection (septic arthritis).  Sometimes, the cause of SI joint dysfunction is not known. What are the signs or symptoms? Symptoms of this condition include:  Aching or burning pain in the lower back. The pain may also spread to other areas, such as: ? Buttocks. ? Groin. ? Thighs and legs.  Muscle spasms in or around the painful areas.  Increased pain when standing, walking, running, stair climbing, bending, or lifting.  How is this diagnosed? Your health care provider will do a physical exam and take your medical history. During the exam, the health care provider may move one or both of your legs to different positions to check for pain. Various tests may be done to help verify the diagnosis, including:  Imaging tests to look for other causes of pain. These may include: ? MRI. ? CT scan. ? Bone scan.  Diagnostic injection. A numbing medicine is injected into the SI joint using a needle. If the pain is temporarily improved or stopped after the injection, this can indicate that SI joint dysfunction is the problem.  How is this treated? Treatment may vary depending on the  cause and severity of your condition. Treatment options may include:  Applying ice or heat to the lower back area. This can help to reduce pain and muscle spasms.  Medicines to relieve pain or inflammation or to relax the muscles.  Wearing a back brace (sacroiliac brace) to help support the joint while your back is healing.  Physical therapy to increase muscle strength around the joint and flexibility at the joint. This may also involve learning proper body positions and ways of moving to relieve stress on the joint.  Direct manipulation of the SI joint.  Injections of steroid medicine into the joint in order to reduce pain and swelling.  Radiofrequency ablation to burn away nerves that are carrying pain messages from the joint.  Use of a device that provides electrical stimulation in order to reduce pain at the joint.  Surgery to put in screws and plates that limit or prevent joint motion. This is rare.  Follow these instructions at home:  Rest as needed. Limit your activities as directed by your health care provider.  Take medicines only as directed by your health care provider.  If directed, apply ice to the affected area: ? Put ice in a plastic bag. ? Place a towel between your skin and the bag. ? Leave the ice on for 20 minutes, 2-3 times per day.  Use a heating pad or a moist heat pack as directed by your health care provider.  Exercise as directed by your health care provider or physical therapist.  Keep all follow-up visits   as directed by your health care provider. This is important. Contact a health care provider if:  Your pain is not controlled with medicine.  You have a fever.  You have increasingly severe pain. Get help right away if:  You have weakness, numbness, or tingling in your legs or feet.  You lose control of your bladder or bowel. This information is not intended to replace advice given to you by your health care provider. Make sure you discuss  any questions you have with your health care provider. Document Released: 08/03/2008 Document Revised: 10/13/2015 Document Reviewed: 01/12/2014 Elsevier Interactive Patient Education  2018 [place].  Degenerative Disk Disease Degenerative disk disease is a condition caused by the changes that occur in spinal disks as you grow older. Spinal disks are soft and compressible disks located between the bones of your spine (vertebrae). These disks act like shock absorbers. Degenerative disk disease can affect the whole spine. However, the neck and lower back are most commonly affected. Many changes can occur in the spinal disks with aging, such as:  The spinal disks may dry and shrink.  Small tears may occur in the tough, outer covering of the disk (annulus).  The disk space may become smaller due to loss of water.  Abnormal growths in the bone (spurs) may occur. This can put pressure on the nerve roots exiting the spinal canal, causing pain.  The spinal canal may become narrowed.  What increases the risk?  Being overweight.  Having a family history of degenerative disk disease.  Smoking.  There is increased risk if you are doing heavy lifting or have a sudden injury. What are the signs or symptoms? Symptoms vary from person to person and may include:  Pain that varies in intensity. Some people have no pain, while others have severe pain. The location of the pain depends on the part of your backbone that is affected. ? You will have neck or arm pain if a disk in the neck area is affected. ? You will have pain in your back, buttocks, or legs if a disk in the lower back is affected.  Pain that becomes worse while bending, reaching up, or with twisting movements.  Pain that may start gradually and then get worse as time passes. It may also start after a major or minor injury.  Numbness or tingling in the arms or legs.  How is this diagnosed? Your health care provider will ask you  about your symptoms and about activities or habits that may cause the pain. He or she may also ask about any injuries, diseases, or treatments you have had. Your health care provider will examine you to check for the range of movement that is possible in the affected area, to check for strength in your extremities, and to check for sensation in the areas of the arms and legs supplied by different nerve roots. You may also have:  An X-ray of the spine.  Other imaging tests, such as MRI.  How is this treated? Your health care provider will advise you on the best plan for treatment. Treatment may include:  Medicines.  Rehabilitation exercises.  Follow these instructions at home:  Follow proper lifting and walking techniques as advised by your health care provider.  Maintain good posture.  Exercise regularly as advised by your health care provider.  Perform relaxation exercises.  Change your sitting, standing, and sleeping habits as advised by your health care provider.  Change positions frequently.  Lose weight or  maintain a healthy weight as advised by your health care provider.  Do not use any tobacco products, including cigarettes, chewing tobacco, or electronic cigarettes. If you need help quitting, ask your health care provider.  Wear supportive footwear.  Take medicines only as directed by your health care provider. Contact a health care provider if:  Your pain does not go away within 1-4 weeks.  You have significant appetite or weight loss. Get help right away if:  Your pain is severe.  You notice weakness in your arms, hands, or legs.  You begin to lose control of your bladder or bowel movements.  You have fevers or night sweats. This information is not intended to replace advice given to you by your health care provider. Make sure you discuss any questions you have with your health care provider. Document Released: 03/04/2007 Document Revised: 10/13/2015  Document Reviewed: 09/08/2013 Elsevier Interactive Patient Education  [employer].

## 2017-10-21 NOTE — Telephone Encounter (Signed)
Left VM with recommendation  

## 2017-10-21 NOTE — Telephone Encounter (Signed)
-----   Message from [facility], [place] sent at 10/21/2017  1:45 PM EDT ----- Can you remind patient to have her lab work done? I cannot refill medication without recent kidney function Thanks!

## 2017-10-31 ENCOUNTER — Encounter: Payer: Self-pay | Admitting: Physical Therapy

## 2017-10-31 ENCOUNTER — Ambulatory Visit (INDEPENDENT_AMBULATORY_CARE_PROVIDER_SITE_OTHER): Payer: 59 | Admitting: Physical Therapy

## 2017-10-31 DIAGNOSIS — M62830 Muscle spasm of back: Secondary | ICD-10-CM | POA: Diagnosis not present

## 2017-10-31 DIAGNOSIS — M545 Low back pain: Secondary | ICD-10-CM | POA: Diagnosis not present

## 2017-10-31 DIAGNOSIS — G8929 Other chronic pain: Secondary | ICD-10-CM | POA: Diagnosis not present

## 2017-10-31 DIAGNOSIS — M25562 Pain in left knee: Secondary | ICD-10-CM

## 2017-10-31 DIAGNOSIS — M6281 Muscle weakness (generalized): Secondary | ICD-10-CM | POA: Diagnosis not present

## 2017-10-31 NOTE — Therapy (Signed)
[place], [place], [address] Phone: [phone]   Fax:  [phone]  Physical Therapy Evaluation  Patient Details  Name: Cynthia [name] MRN: [id number] Date of Birth: 03-13-1966 Referring Provider: Nelson Chimes PA-C   Encounter Date: 10/31/2017  PT End of Session - 10/31/17 1659    Visit Number  1    Number of Visits  6    Date for PT Re-Evaluation  12/12/17    PT Start Time  1702    PT Stop Time  1800    PT Time Calculation (min)  58 min    Activity Tolerance  Patient tolerated treatment well       Past Medical History:  Diagnosis Date  . Chronic venous insufficiency 09/29/2017  . Deep venous thrombosis of lower extremity ([place]) 11/17/2015  . Depression   . History of DVT (deep vein thrombosis)   . Sleep difficulties     Past Surgical History:  Procedure Laterality Date  . COLONOSCOPY  2014  . GASTRIC RESTRICTION SURGERY     lap band  . HERNIA REPAIR      There were no vitals filed for this visit.   Subjective Assessment - 10/31/17 1702    Subjective  Pt reports insideous onset of low back pain starting about 2 months ago.  She is using ibuprofen and that helps some. When she doesn't take anything she thinks it is still a little better    Diagnostic tests  xrays - degenerative changes    Patient Stated Goals  figure out a way to get up /down out of bed without pain.     Currently in Pain?  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          [facility] PT Assessment - 10/31/17 0001      Assessment   Medical Diagnosis  LBP and SIJ dysfunction    Referring Provider  Nelson Chimes PA-C    Onset Date/Surgical Date  09/05/17    Hand Dominance  Right    Next MD Visit  PRN    Prior Therapy  none      Precautions   Precautions  None      Balance Screen   Has the patient fallen in the past 6 months  No      [place] residence    Living Arrangements  Spouse/significant other    Home Layout  Two level a lot of difficulty, going sideways - knee      Prior Function   Level of Independence  Independent    Vocation  Full time employment    Therapist, 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    Leisure  walk      Observation/Other Assessments   Focus on Therapeutic Outcomes (FOTO)   37% limited      Observation/Other Assessments-Edema    Edema  -- (+)  edema in Lt LE ( old DVT) stays swelling       Functional Tests   Functional tests  Squat      Squat   Comments  crepitus in knees LE's ER during lowering  - with knee pain      Posture/Postural Control   Posture/Postural Control  Postural limitations    Postural Limitations  -- obesity, slight knee flexion Lt       ROM / Strength   AROM / PROM / Strength  AROM;Strength      AROM   AROM Assessment Site  Hip;Lumbar    Right/Left Hip  -- WNL rotation, others limited due to soft tissue     Lumbar Flexion  4" from the floor, with pain in buttocks    Lumbar Extension  WNL    Lumbar - Right Rotation  WNL    Lumbar - Left Rotation  WNL      Strength   Strength Assessment Site  Hip;Ankle;Knee;Lumbar    Right/Left Hip  Left Rt WNL    Left Hip Flexion  4/5    Left Hip Extension  4+/5    Left Hip ABduction  4/5    Right/Left Knee  -- WNL    Right/Left Ankle  -- WNL    Lumbar Extension  -- multifidi Rt good, Lt fair and delayed      Flexibility   Soft Tissue Assessment /Muscle Length  yes LE's WNL some tightness in Lt piriformis    Quadriceps  prone knee flex Rt  123, Lt 117      Palpation   Patella mobility  painful Lt knee     Palpation comment  tightness and tenderness in Lt lumbar paraspinals, gluts and piriformis, some tightness Rt side                Objective measurements completed on examination: See above findings.      [facility] Adult PT Treatment/Exercise - 10/31/17 0001      Self-Care   Self-Care  Other Self-Care Comments    Other Self-Care Comments   log rolling/bed mobility      Exercises   Exercises  Lumbar      Lumbar Exercises: Stretches   Double Knee to Chest Stretch  30 seconds;2 reps    Lower Trunk Rotation  2 reps;30 seconds    Piriformis Stretch  Left;30 seconds;2 reps VC for form      Lumbar Exercises: Supine   Other Supine Lumbar Exercises  quad sets Lt 10x10sec      Modalities   Modalities  Electrical Stimulation;Moist Heat      Moist Heat Therapy   Number Minutes Moist Heat  10 Minutes    Moist Heat Location  -- buttocks      Electrical Stimulation   Electrical Stimulation Location  bilat buttocks and low bad    Electrical Stimulation Action  IFC    Electrical Stimulation Parameters  to tolerance    Electrical Stimulation Goals  Pain;Tone             PT Education - 10/31/17 1739    Education Details  HEP    Person(s) Educated  Patient    Methods  Demonstration;Explanation;Handout    Comprehension  Returned demonstration;Verbalized understanding          PT Long Term Goals - 10/31/17 1654      PT LONG TERM GOAL #1   Title  i with HEP to include a formal walking program (12/12/17)  Time  6    Period  Weeks    Status  New      PT LONG TERM GOAL #2   Title  improve FOTO =/< 27% limited (12/12/17)     Time  6    Period  Weeks    Status  New      PT LONG TERM GOAL #3   Title  report =/> 75% reduction of LBP and Lt knee pain with standing at work (12/12/17)     Time  6    Period  Weeks    Status  New      PT LONG TERM GOAL #4   Title  increase Lt LE strength =/> 5-/5 (  12/12/17)     Time  6    Period  Weeks    Status  New      PT LONG TERM GOAL #5   Title  demo painfree lumbar ROM ( 12/12/17)     Time  6    Period  Weeks    Status  New             Plan - 10/31/17 1752    Clinical Impression Statement  52 yo female presents with about a 2 month h/o of low back and buttock pain.  She has tightness in the lumbar and buttock musculature, Lt side more so than Rt. She also has some weakness in the Rt LE and core along with extra weight. Overall her ROM is good in her lumbar spine and hips. She also has a lot of edema in the Lt LE. Patient reports knee pain Lt as well as low back pain.     Clinical Presentation  Stable    Clinical Decision Making  Low    Rehab Potential  Good    PT Frequency  1x / week    PT Duration  6 weeks    PT Treatment/Interventions  Dry needling;Manual techniques;Moist Heat;Ultrasound;Therapeutic activities;Patient/family education;Therapeutic exercise;Cryotherapy;Electrical Stimulation    PT Next Visit Plan  manual work low back and buttocks, possible DN however pt not very interested at this time, gentle core work and Lt LE Pharmacist, hospital and Agree with Plan of Care  Patient       Patient will benefit from skilled therapeutic intervention in order to improve the following deficits and impairments:  Pain, Decreased strength, Improper body mechanics, Increased muscle spasms  Visit Diagnosis: Chronic bilateral low back pain without sciatica - Plan: PT plan of care cert/re-cert  Muscle weakness (generalized) - Plan: PT plan of care cert/re-cert  Chronic pain of left knee - Plan: PT plan of care cert/re-cert  Muscle spasm of back - Plan: PT plan of care cert/re-cert     Problem List Patient Active Problem List   Diagnosis Date Noted  . Sacroiliac joint dysfunction of both sides 10/21/2017  . Lumbar degenerative disc disease 10/21/2017  . Abnormal weight gain 10/21/2017  . Chronic venous  insufficiency 09/29/2017  . Pain and swelling of left lower extremity 09/19/2017  . Peripheral edema 09/19/2017  . Morbid obesity with BMI of 40.0-44.9, adult ([place]) 09/18/2017  . Urinary incontinence 09/18/2017  . History of DVT (deep vein thrombosis)   . Lesion of breast 06/18/2017  . LGSIL on Pap smear of cervix 11/22/2015  . Migraine 11/17/2015  . Vitamin D deficiency 11/17/2015  . Constipation-normal barium enema Sept 2013 02/06/2012  . Lapband APL + HH repair (1 suture) 01/28/2012  Boneta Lucks rPT  10/31/2017, 6:04 PM  [facility] [place], [place], [address] Phone: [phone]   Fax:  [phone]  Name: Cynthia [name] MRN: [id number] Date of Birth: [birth year]

## 2017-10-31 NOTE — Patient Instructions (Addendum)
Lumbar Rotation (Non-Weight Bearing) - can have arms out to sides like a T for chest stretch.     Feet on floor, slowly rock knees to one side, hold.  Then lift and rotate to other side, pain-free range of motion. Allow lower back to rotate slightly. Hold 20-30 sec. Repeat _2___ times per set. Do __1__ sets per session. Do _2___ sessions per day.  Knee-to-Chest Stretch: Bilateral - use a sheet or strap under knees to hold them in    With hands behind knees, pull both knees in to chest until a comfortable stretch is felt in lower back and buttocks. Keep back relaxed. Hold _20-30___ seconds. Repeat __2__ times per set. Do _1___ sets per session. Do __2__ sessions per day.  Piriformis (Supine) - can use a strap under knee to pull the legs    Cross legs, right on top. Gently pull other knee toward chest until stretch is felt in buttock/hip of top leg. Hold _20-30___ seconds. Repeat __2_ times per set. Do _1___ sets per session. Do __2__ sessions per day.  Quad Set    With other leg bent, foot flat, slowly tighten muscles on thigh of straight leg while counting out loud to _10___. Repeat __10__ times. Do _1___ sessions per day.   TENS UNIT: This is helpful for muscle pain and spasm.   Search and Purchase a TENS 7000 2nd edition at www.tenspros.com. It should be less than $30.     TENS unit instructions: Do not shower or bathe with the unit on Turn the unit off before removing electrodes or batteries If the electrodes lose stickiness add a drop of water to the electrodes after they are disconnected from the unit and place on plastic sheet. If you continued to have difficulty, call the TENS unit company to purchase more electrodes. Do not apply lotion on the skin area prior to use. Make sure the skin is clean and dry as this will help prolong the life of the electrodes. After use, always check skin for unusual red areas, rash or other skin difficulties. If there are any skin  problems, does not apply electrodes to the same area. Never remove the electrodes from the unit by pulling the wires. Do not use the TENS unit or electrodes other than as directed. Do not change electrode placement without consultating your therapist or physician. Keep 2 fingers with between each electrode. Wear time ratio is 2:1, on to off times.    For example on for 30 minutes off for 15 minutes and then on for 30 minutes off for 15 minutes

## 2017-11-04 ENCOUNTER — Other Ambulatory Visit: Payer: Self-pay

## 2017-11-04 ENCOUNTER — Ambulatory Visit (INDEPENDENT_AMBULATORY_CARE_PROVIDER_SITE_OTHER): Payer: 59 | Admitting: Surgery

## 2017-11-04 ENCOUNTER — Ambulatory Visit (INDEPENDENT_AMBULATORY_CARE_PROVIDER_SITE_OTHER): Payer: 59 | Admitting: Physical Therapy

## 2017-11-04 ENCOUNTER — Encounter: Payer: Self-pay | Admitting: Surgery

## 2017-11-04 ENCOUNTER — Encounter: Payer: Self-pay | Admitting: Physician Assistant

## 2017-11-04 VITALS — BP 123/72 | HR 59 | Temp 98.9°F | Resp 20 | Ht 66.0 in | Wt 280.0 lb

## 2017-11-04 DIAGNOSIS — M25562 Pain in left knee: Secondary | ICD-10-CM | POA: Diagnosis not present

## 2017-11-04 DIAGNOSIS — M6281 Muscle weakness (generalized): Secondary | ICD-10-CM

## 2017-11-04 DIAGNOSIS — M545 Low back pain: Secondary | ICD-10-CM

## 2017-11-04 DIAGNOSIS — I872 Venous insufficiency (chronic) (peripheral): Secondary | ICD-10-CM | POA: Diagnosis not present

## 2017-11-04 DIAGNOSIS — G8929 Other chronic pain: Secondary | ICD-10-CM | POA: Diagnosis not present

## 2017-11-04 DIAGNOSIS — M62830 Muscle spasm of back: Secondary | ICD-10-CM | POA: Diagnosis not present

## 2017-11-04 DIAGNOSIS — R609 Edema, unspecified: Secondary | ICD-10-CM

## 2017-11-04 MED ORDER — AMBULATORY NON FORMULARY MEDICATION: 0 refills | Status: DC

## 2017-11-04 NOTE — Therapy (Addendum)
[place], [place], [address] Phone: [phone]   Fax:  [phone]  Physical Therapy Treatment  Patient Details  Name: Cynthia [name] MRN: [id number] Date of Birth: 1966/04/03 Referring Provider: Nelson Chimes, PA-C   Encounter Date: 11/04/2017  PT End of Session - 11/04/17 1605    Visit Number  2    Number of Visits  6    Date for PT Re-Evaluation  12/12/17    PT Start Time  1603    PT Stop Time  1656    PT Time Calculation (min)  53 min    Activity Tolerance  Patient tolerated treatment well    Behavior During Therapy  [facility] for tasks assessed/performed       Past Medical History:  Diagnosis Date  . Chronic venous insufficiency 09/29/2017  . Deep venous thrombosis of lower extremity ([place]) 11/17/2015  . Depression   . History of DVT (deep vein thrombosis)   . Sleep difficulties     Past Surgical History:  Procedure Laterality Date  . COLONOSCOPY  2014  . GASTRIC RESTRICTION SURGERY     lap band  . HERNIA REPAIR      There were no vitals filed for this visit.  Subjective Assessment - 11/04/17 1606    Subjective  Pt reports she feels a little better.  She didn't take any pain medicine today.  She has been doing the stretches, "the ones that stretch my butt are the best".      Currently in Pain?  Yes    Pain Score  5     Pain Location  Buttocks    Pain Orientation  Left    Pain Descriptors / Indicators  Aching;Dull    Aggravating Factors   standing all day.  transitioning from bed.     Pain Relieving Factors  gentle walking, sleeping with feet up.           [facility] PT Assessment - 11/04/17 0001      Assessment   Medical Diagnosis  LBP and SIJ dysfunction    Referring Provider  Nelson Chimes, PA-C    Onset Date/Surgical Date  09/05/17    Hand Dominance  Right    Next MD Visit  PRN       [facility] Adult PT Treatment/Exercise - 11/04/17 0001      Self-Care   Self-Care  Other Self-Care Comments    Other Self-Care Comments   pt educated on log roll and self massage with ball to hip/buttocks/low back musculature. Pt verbalized understanding and returned demo.       Lumbar Exercises: Stretches   Double Knee to Chest Stretch  1 rep;30 seconds    Lower Trunk Rotation  10 seconds;4 reps    Quad Stretch  Right;Left;4 reps 2 reps LLE seated, 2 reps prone.     Piriformis Stretch  Left;30 seconds;2 reps VC for form      Lumbar Exercises: Aerobic   Tread Mill  5 min, 2.2- 2.63mh      Lumbar Exercises: Seated   Sit to Stand  5 reps core engaged, some increased knee discomfort    Other Seated Lumbar Exercises  core engagement with lap press x 5 sec x 10 reps       Lumbar Exercises: Supine   Ab Set  5 reps;5 seconds    Other Supine Lumbar Exercises  reviewed supine to sit via log roll x 3 reps  Moist Heat Therapy   Number Minutes Moist Heat  15 Minutes    Moist Heat Location  -- buttocks      Electrical Stimulation   Electrical Stimulation Location  bilat buttocks and low back paraspinals    Electrical Stimulation Action  TENS    Electrical Stimulation Parameters  to tolerance     Electrical Stimulation Goals  Pain;Tone             PT Education - 11/04/17 1649    Education Details  reviewed HEP and log roll.  Self massage    Person(s) Educated  Patient    Methods  Explanation;Demonstration;Verbal cues    Comprehension  Verbalized understanding;Returned demonstration          PT Long Term Goals - 11/04/17 1646      PT LONG TERM GOAL #1   Title  i with HEP to include a formal walking program (12/12/17)      Time  6    Period  Weeks    Status  On-going      PT LONG TERM GOAL #2   Title  improve FOTO =/< 27% limited (12/12/17)     Time  6    Period  Weeks    Status  On-going      PT LONG TERM GOAL #3   Title  report =/> 75% reduction of LBP and Lt knee pain with standing at work (12/12/17)     Time  6    Period  Weeks     Status  On-going      PT LONG TERM GOAL #4   Title  increase Lt LE strength =/> 5-/5 ( 12/12/17)     Time  6    Period  Weeks    Status  On-going      PT LONG TERM GOAL #5   Title  demo painfree lumbar ROM ( 12/12/17)     Time  6    Period  Weeks    Status  On-going            Plan - 11/04/17 1633    Clinical Impression Statement  Pt reported 3 point drop in pain with exercises, further reduction with estim and MHP.  Pt tolerated most exercises well; had difficulty with seated piriformis stretch and prone quad stretch due to LLE swelling. Progressing towards goals.     Rehab Potential  Good    PT Frequency  1x / week    PT Duration  6 weeks    PT Treatment/Interventions  Dry needling;Manual techniques;Moist Heat;Ultrasound;Therapeutic activities;Patient/family education;Therapeutic exercise;Cryotherapy;Electrical Stimulation    PT Next Visit Plan  manual work to Rt buttocks and low back.  continue core strengthening.     Consulted and Agree with Plan of Care  Patient       Patient will benefit from skilled therapeutic intervention in order to improve the following deficits and impairments:  Pain, Decreased strength, Improper body mechanics, Increased muscle spasms  Visit Diagnosis: Chronic bilateral low back pain without sciatica  Muscle weakness (generalized)  Chronic pain of left knee  Muscle spasm of back     Problem List Patient Active Problem List   Diagnosis Date Noted  . Sacroiliac joint dysfunction of both sides 10/21/2017  . Lumbar degenerative disc disease 10/21/2017  . Abnormal weight gain 10/21/2017  . Chronic venous insufficiency 09/29/2017  . Pain and swelling of left lower extremity 09/19/2017  . Peripheral edema 09/19/2017  . Morbid obesity with BMI  of 40.0-44.9, adult ([place]) 09/18/2017  . Urinary incontinence 09/18/2017  . History of DVT (deep vein thrombosis)   . Lesion of breast 06/18/2017  . LGSIL on Pap smear of cervix 11/22/2015  .  Migraine 11/17/2015  . Vitamin D deficiency 11/17/2015  . Constipation-normal barium enema Sept 2013 02/06/2012  . Lapband APL + HH repair (1 suture) 01/28/2012   Kerin Perna, PTA 11/04/17 4:51 PM  [place], [place], [address] Phone: [phone]   Fax:  [phone]  Name: LOVEDA COLAIZZI MRN: [id number] Date of Birth: 05-07-1966   PHYSICAL THERAPY DISCHARGE SUMMARY  Visits from Start of Care: 2 Current functional level related to goals / functional outcomes: Unable to assess as pt did not return to PT  Plan: Patient agrees to discharge.  Patient goals were not met. Patient is being discharged due to not returning since the last visit.  ?????

## 2017-11-04 NOTE — Progress Notes (Signed)
Vascular and Vein Specialist of [facility]  Patient name: Cynthia [name] MRN: [id number] DOB: Sep 07, 1965 Sex: female   REQUESTING PROVIDER:    Nelson Chimes   REASON FOR CONSULT:    Leg swelling  HISTORY OF PRESENT ILLNESS:   Cynthia [name] is a 52 y.o. female, who is referred today for evaluation of leg swelling.  The patient states that she had a left leg DVT approximately 20 years ago.  She was treated with 1 year of Coumadin and 6 months of Lovenox.  She then went on to have a lap band and had significant weight loss.  She was not bothered by her leg.  However more recently, she has put on some weight and has had trouble with swelling in her legs, left greater than right.  She does wear compression stockings and tries to keep her leg elevated.  She has had an episode where she had a skin tear which was challenging to heal.  PAST MEDICAL HISTORY    Past Medical History:  Diagnosis Date  . Chronic venous insufficiency 09/29/2017  . Deep venous thrombosis of lower extremity ([place]) 11/17/2015  . Depression   . History of DVT (deep vein thrombosis)   . Sleep difficulties      FAMILY HISTORY   Family History  Problem Relation Age of Onset  . Stroke Mother   . Hypertension Mother   . Heart disease Father   . Heart attack Father   . Alcohol abuse Father   . Prostate cancer Father   . Uterine cancer Maternal Grandmother     SOCIAL HISTORY:   Social History   Socioeconomic History  . Marital status: Single    Spouse name: Not on file  . Number of children: Not on file  . Years of education: Not on file  . Highest education level: Not on file  Occupational History  . Not on file  Social Needs  . Financial resource strain: Not on file  . Food insecurity:    Worry: Not on file    Inability: Not on file  . Transportation needs:    Medical: Not on file    Non-medical: Not on file  Tobacco Use  . Smoking status:  Never Smoker  . Smokeless tobacco: Never Used  Substance and Sexual Activity  . Alcohol use: No  . Drug use: No  . Sexual activity: Yes    Birth control/protection: Post-menopausal  Lifestyle  . Physical activity:    Days per week: Not on file    Minutes per session: Not on file  . Stress: Not on file  Relationships  . Social connections:    Talks on phone: Not on file    Gets together: Not on file    Attends religious service: Not on file    Active member of club or organization: Not on file    Attends meetings of clubs or organizations: Not on file    Relationship status: Not on file  . Intimate partner violence:    Fear of current or ex partner: Not on file    Emotionally abused: Not on file    Physically abused: Not on file    Forced sexual activity: Not on file  Other Topics Concern  . Not on file  Social History Narrative  . Not on file    ALLERGIES:    Allergies  Allergen Reactions  . Penicillin G Itching  . Latex Rash    Where touched and will spread.  CURRENT MEDICATIONS:    Current Outpatient Medications  Medication Sig Dispense Refill  . AMBULATORY NON FORMULARY MEDICATION Knee-high, medium compression, graduated compression stockings. Apply to bilateral lower extremities daily 1 each 0  . aspirin EC 81 MG tablet Take 81 mg by mouth daily.    Marland Kitchen buPROPion (WELLBUTRIN XL) 150 MG 24 hr tablet Take 1 tablet (150 mg total) by mouth daily. 90 tablet 0  . cyclobenzaprine (FLEXERIL) 10 MG tablet Take 1 tablet (10 mg total) by mouth at bedtime as needed for muscle spasms. 30 tablet 0  . ibuprofen (ADVIL,MOTRIN) 800 MG tablet Take 1 tablet (800 mg total) by mouth every 8 (eight) hours as needed for moderate pain. 60 tablet 0  . linaclotide (LINZESS) 290 MCG CAPS capsule Take 290 mcg by mouth daily before breakfast.    . Multiple Vitamins-Minerals (MULTIVITAMIN WITH MINERALS) tablet Take 1 tablet by mouth daily.    Marland Kitchen omeprazole (PRILOSEC) 20 MG capsule Take 1  capsule (20 mg total) by mouth daily. For GI prophylaxis while taking NSAIDs 30 capsule 11  . zolpidem (AMBIEN) 5 MG tablet Take 5 mg by mouth at bedtime as needed. For sleep      No current facility-administered medications for this visit.     REVIEW OF SYSTEMS:   [X]  denotes positive finding, [ ]  denotes negative finding Cardiac  Comments:  Chest pain or chest pressure:    Shortness of breath upon exertion: x   Short of breath when lying flat:    Irregular heart rhythm:        Vascular    Pain in calf, thigh, or hip brought on by ambulation:    Pain in feet at night that wakes you up from your sleep:     Blood clot in your veins:    Leg swelling:  x       Pulmonary    Oxygen at home:    Productive cough:     Wheezing:         Neurologic    Sudden weakness in arms or legs:     Sudden numbness in arms or legs:     Sudden onset of difficulty speaking or slurred speech:    Temporary loss of vision in one eye:     Problems with dizziness:         Gastrointestinal    Blood in stool:      Vomited blood:         Genitourinary    Burning when urinating:     Blood in urine:        Psychiatric    Major depression:         Hematologic    Bleeding problems:    Problems with blood clotting too easily:        Skin    Rashes or ulcers:        Constitutional    Fever or chills:     PHYSICAL EXAM:   Vitals:   11/04/17 1057  BP: 123/72  Pulse: (!) 59  Resp: 20  Temp: 98.9 F (37.2 C)  TempSrc: Oral  SpO2: 100%  Weight: 280 lb (127 kg)  Height: 5\' 6"  (1.676 m)    GENERAL: The patient is a well-nourished female, in no acute distress. The vital signs are documented above. CARDIAC: There is a regular rate and rhythm.  VASCULAR: Bilateral edema left greater than right PULMONARY: Nonlabored respirations MUSCULOSKELETAL: There are no major deformities or cyanosis. NEUROLOGIC: No focal  weakness or paresthesias are detected. SKIN: There are no ulcers or rashes  noted. PSYCHIATRIC: The patient has a normal affect.  STUDIES:   I have reviewed her reflux study which shows no evidence of DVT in the left leg.  She had reflux within the left common femoral, femoral, popliteal, and great saphenous vein at the groin, proximal thigh and distal thigh, and small saphenous vein at the saphenous popliteal junction  Saphenous vein diameters range from 0.43-0.63. Small saphenous vein diameters were 0.61  ASSESSMENT and PLAN   Chronic venous insufficiency: I discussed with the patient that she needs to wear compression stockings on a consistent daily basis.  I have recommended that she get new 20-30 compression stockings and to make sure that they are fitted properly.  She may also require increasing the compression up to 40-50.  We talked about leg elevation as well as referral to a lymphedema specialist.  Based on her vein diameters as well as reflux within the deep venous system including the popliteal vein, I am not sure how much benefit she is going to get from endovenous laser ablation of the saphenous vein.  She will try these conservative measures first and if she does not get significant benefit, we can reevaluate the possibility of laser ablation.   Annamarie Major, MD Vascular and Vein Specialists of [facility] [phone] Pager [phone]

## 2017-11-04 NOTE — Patient Instructions (Signed)
3 part core exercise    With neutral spine, tighten pelvic floor, then tighten abdominal muscles sucking your belly button to back bone, then tighten muscles in low back at waist. Hold 10 seconds. Repeat _10_ times. Do _several__ times a day.  * When you have mastered this, you can contract all 3 muscle groups at same time.   * Progress to do this activity sitting, standing, walking and functional activities.

## 2017-11-11 DIAGNOSIS — Z130 Encounter for screening for diseases of the blood and blood-forming organs and certain disorders involving the immune mechanism: Secondary | ICD-10-CM | POA: Diagnosis not present

## 2017-11-11 DIAGNOSIS — Z0000 Encounter for general adult medical examination without abnormal findings: Secondary | ICD-10-CM | POA: Diagnosis not present

## 2017-11-11 DIAGNOSIS — Z131 Encounter for screening for diabetes mellitus: Secondary | ICD-10-CM | POA: Diagnosis not present

## 2017-11-11 DIAGNOSIS — Z6841 Body Mass Index (BMI) 40.0 and over, adult: Secondary | ICD-10-CM | POA: Diagnosis not present

## 2017-11-12 LAB — CBC
HCT: 35.9 % (ref 35.0–45.0)
HEMOGLOBIN: 11.7 g/dL (ref 11.7–15.5)
MCH: 25.7 pg — ABNORMAL LOW (ref 27.0–33.0)
MCHC: 32.6 g/dL (ref 32.0–36.0)
MCV: 78.7 fL — ABNORMAL LOW (ref 80.0–100.0)
MPV: 11.3 fL (ref 7.5–12.5)
PLATELETS: 199 10*3/uL (ref 140–400)
RBC: 4.56 10*6/uL (ref 3.80–5.10)
RDW: 13.4 % (ref 11.0–15.0)
WBC: 5 10*3/uL (ref 3.8–10.8)

## 2017-11-12 LAB — COMPREHENSIVE METABOLIC PANEL
AG RATIO: 1.6 (calc) (ref 1.0–2.5)
ALBUMIN MSPROF: 4 g/dL (ref 3.6–5.1)
ALT: 12 U/L (ref 6–29)
AST: 15 U/L (ref 10–35)
Alkaline phosphatase (APISO): 70 U/L (ref 33–130)
BUN: 17 mg/dL (ref 7–25)
CHLORIDE: 110 mmol/L (ref 98–110)
CO2: 27 mmol/L (ref 20–32)
CREATININE: 0.85 mg/dL (ref 0.50–1.05)
Calcium: 10.9 mg/dL — ABNORMAL HIGH (ref 8.6–10.4)
GLOBULIN: 2.5 g/dL (ref 1.9–3.7)
GLUCOSE: 85 mg/dL (ref 65–99)
POTASSIUM: 3.7 mmol/L (ref 3.5–5.3)
SODIUM: 143 mmol/L (ref 135–146)
TOTAL PROTEIN: 6.5 g/dL (ref 6.1–8.1)
Total Bilirubin: 0.3 mg/dL (ref 0.2–1.2)

## 2017-11-12 LAB — HEMOGLOBIN A1C
EAG (MMOL/L): 6.2 (calc)
Hgb A1c MFr Bld: 5.5 % of total Hgb (ref <5.7)
MEAN PLASMA GLUCOSE: 111 (calc)

## 2017-11-12 LAB — LIPID PANEL W/REFLEX DIRECT LDL
CHOL/HDL RATIO: 4 (calc) (ref <5.0)
Cholesterol: 140 mg/dL (ref <200)
HDL: 35 mg/dL — AB (ref >50)
LDL Cholesterol (Calc): 86 mg/dL (calc)
NON-HDL CHOLESTEROL (CALC): 105 mg/dL (ref <130)
Triglycerides: 95 mg/dL (ref <150)

## 2017-11-12 LAB — TSH+FREE T4: TSH W/REFLEX TO FT4: 2.72 m[IU]/L

## 2017-11-13 DIAGNOSIS — R718 Other abnormality of red blood cells: Secondary | ICD-10-CM | POA: Insufficient documentation

## 2017-11-13 DIAGNOSIS — D509 Iron deficiency anemia, unspecified: Secondary | ICD-10-CM | POA: Insufficient documentation

## 2017-11-13 NOTE — Addendum Note (Signed)
Addended by: Nelson Chimes E on: 11/13/2017 09:59 AM   Modules accepted: Orders

## 2017-11-13 NOTE — Progress Notes (Signed)
Calcium level is consistently elevated. This can indicate a problem with the parathyroid glands. Would like patient to return to the lab to test PTH and calcium level Also has mild anemia. I will check iron and hemoglobin electrophoresis to look for cause Other labs look good - no evidence of diabetes - normal thyroid - normal cholesterol

## 2017-11-14 ENCOUNTER — Encounter: Payer: 59 | Admitting: Physical Therapy

## 2017-11-27 ENCOUNTER — Other Ambulatory Visit: Payer: Self-pay

## 2017-11-27 DIAGNOSIS — R718 Other abnormality of red blood cells: Secondary | ICD-10-CM

## 2017-11-27 DIAGNOSIS — D509 Iron deficiency anemia, unspecified: Secondary | ICD-10-CM | POA: Diagnosis not present

## 2017-11-27 DIAGNOSIS — E210 Primary hyperparathyroidism: Secondary | ICD-10-CM

## 2017-11-28 ENCOUNTER — Encounter: Payer: Self-pay | Admitting: Physician Assistant

## 2017-11-28 DIAGNOSIS — E213 Hyperparathyroidism, unspecified: Secondary | ICD-10-CM | POA: Insufficient documentation

## 2017-11-28 DIAGNOSIS — E210 Primary hyperparathyroidism: Secondary | ICD-10-CM | POA: Insufficient documentation

## 2017-11-28 HISTORY — DX: Primary hyperparathyroidism: E21.0

## 2017-11-28 NOTE — Progress Notes (Signed)
Hi Emberlynn,  Your labs show that you have primary hyperparathyroidism. Your a parathyroid glands control calcium levels in the body. This means that your parathyroid glands are over-active. This is the cause of your high calcium and can also cause a wide variety of symptoms I am referring you to an endocrinologist for this You can read more about it here: [email or web address]  Best, Evlyn Clines

## 2017-11-30 ENCOUNTER — Other Ambulatory Visit: Payer: Self-pay | Admitting: Physician Assistant

## 2017-12-02 ENCOUNTER — Ambulatory Visit (INDEPENDENT_AMBULATORY_CARE_PROVIDER_SITE_OTHER): Payer: 59 | Admitting: Sports Medicine

## 2017-12-02 ENCOUNTER — Ambulatory Visit (INDEPENDENT_AMBULATORY_CARE_PROVIDER_SITE_OTHER): Payer: 59

## 2017-12-02 ENCOUNTER — Encounter: Payer: Self-pay | Admitting: Sports Medicine

## 2017-12-02 DIAGNOSIS — M7989 Other specified soft tissue disorders: Secondary | ICD-10-CM | POA: Diagnosis not present

## 2017-12-02 DIAGNOSIS — M1712 Unilateral primary osteoarthritis, left knee: Secondary | ICD-10-CM

## 2017-12-02 DIAGNOSIS — M1711 Unilateral primary osteoarthritis, right knee: Secondary | ICD-10-CM | POA: Diagnosis not present

## 2017-12-02 LAB — HEMOGLOBINOPATHY EVALUATION
HCT: 38.6 % (ref 35.0–45.0)
HEMOGLOBIN A2 - HGBRFX: 2.2 % (ref 1.8–3.5)
Hemoglobin: 12.2 g/dL (ref 11.7–15.5)
Hgb A: 96.8 % (ref >96.0)
MCH: 25.2 pg — AB (ref 27.0–33.0)
MCV: 79.6 fL — ABNORMAL LOW (ref 80.0–100.0)
RBC: 4.85 10*6/uL (ref 3.80–5.10)
RDW: 13.3 % (ref 11.0–15.0)

## 2017-12-02 LAB — PTH, INTACT AND CALCIUM
Calcium: 11.6 mg/dL — ABNORMAL HIGH (ref 8.6–10.4)
PTH: 316 pg/mL — AB (ref 14–64)

## 2017-12-02 LAB — IRON,TIBC AND FERRITIN PANEL
%SAT: 16 % (calc) (ref 16–45)
Ferritin: 132 ng/mL (ref 16–232)
Iron: 46 ug/dL (ref 45–160)
TIBC: 294 ug/dL (ref 250–450)

## 2017-12-02 MED ORDER — FUROSEMIDE 20 MG PO TABS
20.0000 mg | ORAL_TABLET | Freq: Every day | ORAL | 3 refills | Status: DC
Start: 2017-12-02 — End: 2018-01-23

## 2017-12-02 MED FILL — FUROSEMIDE 20 MG TABS: 20 | 30 days supply | Qty: 30 | Fill #0

## 2017-12-02 NOTE — Progress Notes (Addendum)
Subjective:    I'm seeing this patient as a consultation for: Nelson Chimes, PA-C  CC: Left knee pain  HPI: This is a pleasant 52 year old female, for decades she has had chronic swelling her left lower extremity, she has a history of a DVT, more recently she had a DVT ultrasound that was negative.  She has been wearing lower extremity compression hose.  She also has pain and burning that she localizes at the anterolateral joint line, worse with deep flexion.  No mechanical symptoms or trauma.  Has tried oral NSAIDs without improvement.  I reviewed the past medical history, family history, social history, surgical history, and allergies today and no changes were needed.  Please see the problem list section below in epic for further details.  Past Medical History: Past Medical History:  Diagnosis Date  . Chronic venous insufficiency 09/29/2017  . Deep venous thrombosis of lower extremity ([place]) 11/17/2015  . Depression   . History of DVT (deep vein thrombosis)   . Primary hyperparathyroidism ([place]) 11/28/2017  . Sleep difficulties    Past Surgical History: Past Surgical History:  Procedure Laterality Date  . COLONOSCOPY  2014  . GASTRIC RESTRICTION SURGERY     lap band  . HERNIA REPAIR     Social History: Social History   Socioeconomic History  . Marital status: Married    Spouse name: Not on file  . Number of children: Not on file  . Years of education: Not on file  . Highest education level: Not on file  Occupational History  . Not on file  Social Needs  . Financial resource strain: Not on file  . Food insecurity:    Worry: Not on file    Inability: Not on file  . Transportation needs:    Medical: Not on file    Non-medical: Not on file  Tobacco Use  . Smoking status: Never Smoker  . Smokeless tobacco: Never Used  Substance and Sexual Activity  . Alcohol use: No  . Drug use: No  . Sexual activity: Yes    Birth control/protection: Post-menopausal  Lifestyle    . Physical activity:    Days per week: Not on file    Minutes per session: Not on file  . Stress: Not on file  Relationships  . Social connections:    Talks on phone: Not on file    Gets together: Not on file    Attends religious service: Not on file    Active member of club or organization: Not on file    Attends meetings of clubs or organizations: Not on file    Relationship status: Not on file  Other Topics Concern  . Not on file  Social History Narrative  . Not on file   Family History: Family History  Problem Relation Age of Onset  . Stroke Mother   . Hypertension Mother   . Heart disease Father   . Heart attack Father   . Alcohol abuse Father   . Prostate cancer Father   . Uterine cancer Maternal Grandmother    Allergies: Allergies  Allergen Reactions  . Penicillin G Itching  . Latex Rash    Where touched and will spread.   Medications: See med rec.  Review of Systems: No headache, visual changes, nausea, vomiting, diarrhea, constipation, dizziness, abdominal pain, skin rash, fevers, chills, night sweats, weight loss, swollen lymph nodes, body aches, joint swelling, muscle aches, chest pain, shortness of breath, mood changes, visual or auditory hallucinations.  Objective:   General: Well Developed, well nourished, and in no acute distress.  Neuro:  Extra-ocular muscles intact, able to move all 4 extremities, sensation grossly intact.  Deep tendon reflexes tested were normal. Psych: Alert and oriented, mood congruent with affect. ENT:  Ears and nose appear unremarkable.  Hearing grossly normal. Neck: Unremarkable overall appearance, trachea midline.  No visible thyroid enlargement. Eyes: Conjunctivae and lids appear unremarkable.  Pupils equal and round. Skin: Warm and dry, no rashes noted.  Cardiovascular: Pulses palpable, no extremity edema. Left knee: Noted moderate effusion, tenderness at the lateral joint line ROM normal in flexion and extension and  lower leg rotation. Ligaments with solid consistent endpoints including ACL, PCL, LCL, MCL. Negative Mcmurray's and provocative meniscal tests. Non painful patellar compression. Patellar and quadriceps tendons unremarkable. Hamstring and quadriceps strength is normal. 3+ pitting edema in the entire left lower extremity.  Procedure: Real-time Ultrasound Guided Injection of left knee Device: GE Logiq E  Verbal informed consent obtained.  Time-out conducted.  Noted no overlying erythema, induration, or other signs of local infection.  Skin prepped in a sterile fashion.  Local anesthesia: Topical Ethyl chloride.  With sterile technique and under real time ultrasound guidance: 1 cc Kenalog 40, 2 cc lidocaine, 2 cc bupivacaine injected easily. Completed without difficulty  Pain immediately resolved suggesting accurate placement of the medication.  Advised to call if fevers/chills, erythema, induration, drainage, or persistent bleeding.  Images permanently stored and available for review in the ultrasound unit.  Impression: Technically successful ultrasound guided injection.  Impression and Recommendations:   This case required medical decision making of moderate complexity.  Primary osteoarthritis of left knee Ibuprofen 800 not sufficiently effective. Injection as above, x-rays, rehab exercises given.  Left leg swelling Chronic left leg swelling, history of DVT on the side, certainly this could be post thrombotic syndrome however I do want to evaluate for May Thurner syndrome. We will need a CT venogram of the abdomen and pelvis (best imaging modality discussed with radiology). Continue compression hose, adding furosemide 20 mg daily to start.  CT venogram reviewed, no evidence of May Thurner syndrome, continue compression hose, furosemide 20 mg daily, adding lymphedema physical therapy. ___________________________________________ Gwen Her. Dianah Field, M.D., ABFM., CAQSM. Primary  Care and [place] Instructor of [place] of [facility]

## 2017-12-02 NOTE — Assessment & Plan Note (Signed)
Ibuprofen 800 not sufficiently effective. Injection as above, x-rays, rehab exercises given.

## 2017-12-02 NOTE — Assessment & Plan Note (Addendum)
Chronic left leg swelling, history of DVT on the side, certainly this could be post thrombotic syndrome however I do want to evaluate for May Thurner syndrome. We will need a CT venogram of the abdomen and pelvis (best imaging modality discussed with radiology). Continue compression hose, adding furosemide 20 mg daily to start.  CT venogram reviewed, no evidence of May Thurner syndrome, continue compression hose, furosemide 20 mg daily, adding lymphedema physical therapy.

## 2017-12-03 ENCOUNTER — Other Ambulatory Visit: Payer: Self-pay

## 2017-12-03 ENCOUNTER — Ambulatory Visit (INDEPENDENT_AMBULATORY_CARE_PROVIDER_SITE_OTHER): Payer: 59

## 2017-12-03 DIAGNOSIS — R609 Edema, unspecified: Secondary | ICD-10-CM

## 2017-12-03 DIAGNOSIS — Z9884 Bariatric surgery status: Secondary | ICD-10-CM

## 2017-12-03 DIAGNOSIS — M7989 Other specified soft tissue disorders: Secondary | ICD-10-CM

## 2017-12-03 DIAGNOSIS — N200 Calculus of kidney: Secondary | ICD-10-CM

## 2017-12-03 MED ORDER — IOPAMIDOL (ISOVUE-370) INJECTION 76%
100.0000 mL | Freq: Once | INTRAVENOUS | Status: AC | PRN
  Administered 2017-12-03: 100 mL via INTRAVENOUS

## 2017-12-03 MED ORDER — IBUPROFEN 800 MG PO TABS: 800.0000 mg | ORAL_TABLET | Freq: Three times a day (TID) | ORAL | 0 refills | Status: DC | PRN

## 2017-12-03 NOTE — Addendum Note (Signed)
Addended by: Silverio Decamp on: 12/03/2017 04:43 PM   Modules accepted: Orders

## 2017-12-03 NOTE — Telephone Encounter (Signed)
Pt has received RX in past from [facility] for Ibuprofen 800mg  and was wanting refill.   Pt recently saw Dr T for some knee pain- wanting RX refilled and sent to [facility] pended. Dr T, are you ok to send this for pt?

## 2017-12-18 DIAGNOSIS — N951 Menopausal and female climacteric states: Secondary | ICD-10-CM | POA: Diagnosis not present

## 2017-12-18 DIAGNOSIS — E213 Hyperparathyroidism, unspecified: Secondary | ICD-10-CM | POA: Diagnosis not present

## 2017-12-18 DIAGNOSIS — Z6841 Body Mass Index (BMI) 40.0 and over, adult: Secondary | ICD-10-CM | POA: Diagnosis not present

## 2017-12-18 DIAGNOSIS — Z01419 Encounter for gynecological examination (general) (routine) without abnormal findings: Secondary | ICD-10-CM | POA: Diagnosis not present

## 2017-12-22 ENCOUNTER — Encounter: Payer: Self-pay | Admitting: Sports Medicine

## 2017-12-30 DIAGNOSIS — R928 Other abnormal and inconclusive findings on diagnostic imaging of breast: Secondary | ICD-10-CM | POA: Diagnosis not present

## 2017-12-30 LAB — HM MAMMOGRAPHY

## 2018-01-02 ENCOUNTER — Ambulatory Visit: Payer: 59 | Admitting: Sports Medicine

## 2018-01-14 ENCOUNTER — Telehealth (HOSPITAL_COMMUNITY): Payer: Self-pay | Admitting: Physical Therapy

## 2018-01-14 ENCOUNTER — Encounter (HOSPITAL_COMMUNITY): Payer: Self-pay | Admitting: Physical Therapy

## 2018-01-14 ENCOUNTER — Ambulatory Visit (HOSPITAL_COMMUNITY): Payer: 59 | Attending: Sports Medicine | Admitting: Physical Therapy

## 2018-01-14 ENCOUNTER — Other Ambulatory Visit: Payer: Self-pay

## 2018-01-14 DIAGNOSIS — I890 Lymphedema, not elsewhere classified: Secondary | ICD-10-CM | POA: Insufficient documentation

## 2018-01-14 NOTE — Telephone Encounter (Signed)
CR states not to start until pt can commit to 3xwks consecutively. NF 01/14/18

## 2018-01-14 NOTE — Therapy (Signed)
[place], [place], [address] Phone: [phone]   Fax:  [phone]  Physical Therapy Evaluation  Patient Details  Name: Cynthia [name] MRN: [id number] Date of Birth: Jul 24, 1965 Referring Provider: Aundria Mems   Encounter Date: 01/14/2018  PT End of Session - 01/14/18 1305    Visit Number  1    Number of Visits  18    Date for PT Re-Evaluation  02/25/18    PT Start Time  1000    PT Stop Time  1040    PT Time Calculation (min)  40 min    Activity Tolerance  Patient tolerated treatment well    Behavior During Therapy  [facility] for tasks assessed/performed       Past Medical History:  Diagnosis Date  . Chronic venous insufficiency 09/29/2017  . Deep venous thrombosis of lower extremity ([place]) 11/17/2015  . Depression   . History of DVT (deep vein thrombosis)   . Primary hyperparathyroidism ([place]) 11/28/2017  . Sleep difficulties     Past Surgical History:  Procedure Laterality Date  . COLONOSCOPY  2014  . GASTRIC RESTRICTION SURGERY     lap band  . HERNIA REPAIR      There were no vitals filed for this visit.   Subjective Assessment - 01/14/18 0958    Subjective  Ms. Cynthia [name] states that she had a DVT in her Lt LT about 20 years ago and  that her leg has never been the same.  About 15 yrs ago the swelling increased to the point where her skin actually cracked. She states that sometimes her left leg becomes so edematous that she basically drags her foot behind her.   Recently her family noticed that even her thigh,  toes and foot are involved where it was just her lower leg.  Today is a good day and the swellling is down; at times her leg can be twice as big as her right.   She had a [place] earlier this year to rule out existing DVT and it was negative therefore she is being referred to therapy for evaluation of her swelling.  She does wear knee high  compression garments but does not have a compression pump.      Pertinent History  LT DVT, back pain  Family history:  Mother and aunt had lymphedema.    Currently in Pain?  No/denies         [facility] PT Assessment - 01/14/18 0001      Assessment   Medical Diagnosis  left lymphedema    Referring Provider  Aundria Mems    Hand Dominance  Right    Next MD Visit  not schedule    Prior Therapy  none for this issue.       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    Leisure  walk      Cognition   Overall Cognitive  Status  Within Functional Limits for tasks assessed      Observation/Other Assessments   Focus on Therapeutic Outcomes (FOTO)   --   lymphedema life impact 52       LYMPHEDEMA/ONCOLOGY QUESTIONNAIRE - 01/14/18 1004      Type   Cancer Type  --   N/A     Date Lymphedema/Swelling Started   Date  10/19/17      Treatment   Active Chemotherapy Treatment  --   N/A     Lymphedema Stage   Stage  STAGE 2 SPONTANEOUSLY IRREVERSIBLE      Lymphedema Assessments   Lymphedema Assessments  Lower extremities      Right Lower Extremity Lymphedema   20 cm Proximal to Suprapatella  73.5 cm    10 cm Proximal to Suprapatella  57 cm    At Midpatella/Popliteal Crease  42 cm    30 cm Proximal to Floor at Lateral Plantar Foot  39.5 cm    20 cm Proximal to Floor at Lateral Plantar Foot  31 1    10  cm Proximal to Floor at Lateral Malleoli  26.8 cm    Circumference of ankle/heel  35.6 cm.    5 cm Proximal to 1st MTP Joint  24.3 cm    Across MTP Joint  23.7 cm    Around Proximal Great Toe  8.8 cm      Left Lower Extremity  Lymphedema   20 cm Proximal to Suprapatella  72.5 cm    10 cm Proximal to Suprapatella  58.5 cm    At Midpatella/Popliteal Crease  45 cm    30 cm Proximal to Floor at Lateral Plantar Foot  41.5 cm    20 cm Proximal to Floor at Lateral Plantar Foot  34.8 cm    10 cm Proximal to Floor at Lateral Malleoli  35.5 cm    Circumference of ankle/heel  37.4 cm.    5 cm Proximal to 1st MTP Joint  26.7 cm    Across MTP Joint  24.2 cm    Around Proximal Great Toe  9.4 cm             Objective measurements completed on examination: See above findings.              PT Education - 01/14/18 1305    Education Details  PT given LE exercises to promote lymphatic circulation     Person(s) Educated  Patient    Methods  Explanation    Comprehension  Verbalized understanding       PT Short Term Goals - 01/14/18 1316      PT SHORT TERM GOAL #1   Title  PT to be completing LE exercise to promote lymphatic circulation.    Time  1    Period  Weeks    Status  New    Target Date  01/21/18      PT SHORT TERM GOAL #2   Title  Pt to be able to verbalize that lymphedema is a chronic condition and must be maintained by wearing compression garments on a daily basis as well as using a compression pump.     Time  2    Period  Weeks    Status  New    Target Date  01/28/18      PT SHORT TERM GOAL #3   Title  PT to have lost on an average 2 cm of fluid per measurment in Lt LE to decrease  risk of cellulitis     Time  3    Period  Weeks    Status  New    Target Date  02/04/18        PT Long Term Goals - 01/14/18 1324      PT LONG TERM GOAL #1   Title  PT to have lost an average of 3-4 cm of volume in LT LE to allow pt to ambulate with no difficulties     Time  6    Period  Weeks    Status  New    Target Date  02/25/18      PT LONG TERM GOAL #2   Title  PT to be able to don and doff her shoes and pants with greater ease.     Time  6    Period  Weeks    Status  New      PT LONG  TERM GOAL #3   Title  PT to have acquired both compression garment and compression pump and to be using both   ORder faxed to MD today for both.    Time  6    Period  Weeks    Status  New             Plan - 01/14/18 1307    Clinical Impression Statement  Ms. Cynthia [name] is a 52 yo female who had a DVT in her LT LT 20 years ago and has had increased swelling in her leg ever since.  It started just being in her calf area but as years have gone by the swelling has progressed to her thigh, ankle and foot.  The swelling varies but her LT leg is always significantly larger than her right.  She states that her mother and her aunt have been diagnosed with lymphedema.  She has now been referred to skilled physical therapy for evaluation and treatment of her edematous LE.  Upon examination it is noted that her right LE has increased fluid as well just not to the extent of her left.  Ms. Cynthia [name] has signs and symptoms of B hereditary lymphedema and will benefit from skilled physical therapy to ease her ambulation, improve her self image, ease her ability to don and doff her clothes, decrease her risks of cellulitis and education on controling her edema.     Clinical Presentation  Evolving    Clinical Decision Making  Moderate    Rehab Potential  Good    PT Frequency  3x / week    PT Duration  6 weeks    PT Treatment/Interventions  Manual lymph drainage;Compression bandaging;Therapeutic exercise;ADLs/Self Care Home Management    PT Next Visit Plan  Cut foam for LT LE and thigh.  Begin total decongestive techniques followed by compression bandaging of LT LE.  Rt LE is involved therefore do not route fluid to RT.  May progress to RT bandaging in the future if it appears that it will be benefical.     PT Home Exercise Plan  to improve LE lymphatic circulation.     Consulted and Agree with Plan of Care  Patient       Patient will benefit from skilled therapeutic intervention in order to improve the following  deficits and impairments:  Decreased activity tolerance, Difficulty walking, Increased edema, Pain  Visit Diagnosis: Lymphedema, not elsewhere classified     Problem List Patient Active Problem List   Diagnosis Date Noted  . Primary hyperparathyroidism ([place]) 11/28/2017  .  Microcytic anemia 11/13/2017  . Low mean corpuscular volume (MCV) 11/13/2017  . Hypercalcemia 11/13/2017  . Sacroiliac joint dysfunction of both sides 10/21/2017  . Lumbar degenerative disc disease 10/21/2017  . Abnormal weight gain 10/21/2017  . Left leg swelling 09/29/2017  . Primary osteoarthritis of left knee 09/19/2017  . Peripheral edema 09/19/2017  . Morbid obesity with BMI of 40.0-44.9, adult ([place]) 09/18/2017  . Urinary incontinence 09/18/2017  . History of DVT (deep vein thrombosis)   . Lesion of breast 06/18/2017  . LGSIL on Pap smear of cervix 11/22/2015  . Migraine 11/17/2015  . Vitamin D deficiency 11/17/2015  . Constipation-normal barium enema Sept 2013 02/06/2012  . Lapband APL + HH repair (1 suture) 01/28/2012   Rayetta Humphrey, PT CLT [id number] 01/14/2018, 1:27 PM  [facility] [address] [place], [place], [address] Phone: [phone]   Fax:  [phone]  Name: Cynthia [name] MRN: [id number] Date of Birth: [birth year]

## 2018-01-14 NOTE — Telephone Encounter (Signed)
90 mins 3xwk/6wks per CR  pt is to call back to schedule with her calendar. Put afternoon Wed on hold to ask if she can do that next week 01/22/18. NF

## 2018-01-22 ENCOUNTER — Encounter (HOSPITAL_COMMUNITY): Payer: 59 | Admitting: Physical Therapy

## 2018-01-23 ENCOUNTER — Ambulatory Visit (INDEPENDENT_AMBULATORY_CARE_PROVIDER_SITE_OTHER): Payer: 59 | Admitting: Sports Medicine

## 2018-01-23 DIAGNOSIS — M7989 Other specified soft tissue disorders: Secondary | ICD-10-CM | POA: Diagnosis not present

## 2018-01-23 DIAGNOSIS — M1712 Unilateral primary osteoarthritis, left knee: Secondary | ICD-10-CM | POA: Diagnosis not present

## 2018-01-23 DIAGNOSIS — I890 Lymphedema, not elsewhere classified: Secondary | ICD-10-CM

## 2018-01-23 MED ORDER — FUROSEMIDE 40 MG PO TABS: 40.0000 mg | ORAL_TABLET | Freq: Every day | ORAL | 3 refills | Status: DC

## 2018-01-23 MED FILL — FUROSEMIDE 40 MG TAB: 40 | 30 days supply | Qty: 30 | Fill #0

## 2018-01-23 NOTE — Assessment & Plan Note (Signed)
Left worse than right, CT venogram showed no evidence of May Turner syndrome. Improved considerably with Lasix 20. Increasing to 40 mg daily, we will recheck her potassium levels in 1 week. She is also working with the lymphedema physical therapy clinic.

## 2018-01-23 NOTE — Patient Instructions (Signed)
Sodium Hyaluronate intra-articular injection What is this medicine? SODIUM HYALURONATE (SOE dee um hye al yoor ON ate) is used to treat pain in the knee due to osteoarthritis. This medicine may be used for other purposes; ask your health care provider or pharmacist if you have questions. COMMON BRAND NAME(S): Amvisc, DUROLANE, Euflexxa, GELSYN-3, Hyalgan, Monovisc, Orthovisc, Supartz, Supartz FX What should I tell my health care provider before I take this medicine? They need to know if you have any of these conditions: -bleeding disorders -glaucoma -infection in the knee joint -skin conditions or sensitivity -skin infection -an unusual allergic reaction to sodium hyaluronate, other medicines, foods, dyes, or preservatives. Different brands of sodium hyaluronate contain different allergens. Some may contain egg. Talk to your doctor about your allergies to make sure that you get the right product. -pregnant or trying to get pregnant -breast-feeding How should I use this medicine? This medicine is for injection into the knee joint. It is given by a health care professional in a hospital or clinic setting. Talk to your pediatrician regarding the use of this medicine in children. Special care may be needed. Overdosage: If you think you have taken too much of this medicine contact a poison control center or emergency room at once. NOTE: This medicine is only for you. Do not share this medicine with others. What if I miss a dose? This does not apply. What may interact with this medicine? Interactions are not expected. This list may not describe all possible interactions. Give your health care provider a list of all the medicines, herbs, non-prescription drugs, or dietary supplements you use. Also tell them if you smoke, drink alcohol, or use illegal drugs. Some items may interact with your medicine. What should I watch for while using this medicine? Tell your doctor or healthcare professional if  your symptoms do not start to get better or if they get worse. If receiving this medicine for osteoarthritis, limit your activity after you receive your injection. Avoid physical activity for 48 hours following your injection to keep your knee from swelling. Do not stand on your feet for more than 1 hour at a time during the first 48 hours following your injection. Ask your doctor or healthcare professional about when you can begin major physical activity again. What side effects may I notice from receiving this medicine? Side effects that you should report to your doctor or health care professional as soon as possible: -allergic reactions like skin rash, itching or hives, swelling of the face, lips, or tongue -dizziness -facial flushing -pain, tingling, numbness in the hands or feet -vision changes if received this medicine during eye surgery Side effects that usually do not require medical attention (report to your doctor or health care professional if they continue or are bothersome): -back pain -bruising at site where injected -chills -diarrhea -fever -headache -joint pain -joint stiffness -joint swelling -muscle cramps -muscle pain -nausea, vomiting -pain, redness, or irritation at site where injected -weak or tired This list may not describe all possible side effects. Call your doctor for medical advice about side effects. You may report side effects to FDA at 1-800-FDA-1088. Where should I keep my medicine? This drug is given in a hospital or clinic and will not be stored at home. NOTE: This sheet is a summary. It may not cover all possible information. If you have questions about this medicine, talk to your doctor, pharmacist, or health care provider.  2018 Elsevier/Gold Standard (2015-06-09 08:34:51)

## 2018-01-23 NOTE — Progress Notes (Signed)
Subjective:    CC: Follow-up  HPI: Lymphedema: Left worse than right, no evidence of May Turner syndrome on CT venogram.  Lasix 20 has been effective, she could use a higher dose.  She is also started with the lymphedema clinic in [place].  Left knee osteoarthritis: Improved considerably with injection at the last visit.  She is going to look into Visco supplementation.  I reviewed the past medical history, family history, social history, surgical history, and allergies today and no changes were needed.  Please see the problem list section below in epic for further details.  Past Medical History: Past Medical History:  Diagnosis Date  . Chronic venous insufficiency 09/29/2017  . Deep venous thrombosis of lower extremity ([place]) 11/17/2015  . Depression   . History of DVT (deep vein thrombosis)   . Primary hyperparathyroidism ([place]) 11/28/2017  . Sleep difficulties    Past Surgical History: Past Surgical History:  Procedure Laterality Date  . COLONOSCOPY  2014  . GASTRIC RESTRICTION SURGERY     lap band  . HERNIA REPAIR     Social History: Social History   Socioeconomic History  . Marital status: Married    Spouse name: Not on file  . Number of children: Not on file  . Years of education: Not on file  . Highest education level: Not on file  Occupational History  . Not on file  Social Needs  . Financial resource strain: Not on file  . Food insecurity:    Worry: Not on file    Inability: Not on file  . Transportation needs:    Medical: Not on file    Non-medical: Not on file  Tobacco Use  . Smoking status: Never Smoker  . Smokeless tobacco: Never Used  Substance and Sexual Activity  . Alcohol use: No  . Drug use: No  . Sexual activity: Yes    Birth control/protection: Post-menopausal  Lifestyle  . Physical activity:    Days per week: Not on file    Minutes per session: Not on file  . Stress: Not on file  Relationships  . Social connections:    Talks on  phone: Not on file    Gets together: Not on file    Attends religious service: Not on file    Active member of club or organization: Not on file    Attends meetings of clubs or organizations: Not on file    Relationship status: Not on file  Other Topics Concern  . Not on file  Social History Narrative  . Not on file   Family History: Family History  Problem Relation Age of Onset  . Stroke Mother   . Hypertension Mother   . Heart disease Father   . Heart attack Father   . Alcohol abuse Father   . Prostate cancer Father   . Uterine cancer Maternal Grandmother    Allergies: Allergies  Allergen Reactions  . Penicillin G Itching  . Latex Rash    Where touched and will spread.   Medications: See med rec.  Review of Systems: No fevers, chills, night sweats, weight loss, chest pain, or shortness of breath.   Objective:    General: Well Developed, well nourished, and in no acute distress.  Neuro: Alert and oriented x3, extra-ocular muscles intact, sensation grossly intact.  HEENT: Normocephalic, atraumatic, pupils equal round reactive to light, neck supple, no masses, no lymphadenopathy, thyroid nonpalpable.  Skin: Warm and dry, no rashes. Cardiac: Regular rate and rhythm, no murmurs  rubs or gallops, no lower extremity edema.  Respiratory: Clear to auscultation bilaterally. Not using accessory muscles, speaking in full sentences.  Impression and Recommendations:    Primary osteoarthritis of left knee Well after injection, 60 to 70% improvement, still has some discomfort. May continue ibuprofen as needed. I did give her some information to look into Visco supplementation. She will call and let me know if she would like to proceed and we will work on getting her approved.  Lymphedema Left worse than right, CT venogram showed no evidence of May Turner syndrome. Improved considerably with Lasix 20. Increasing to 40 mg daily, we will recheck her potassium levels in 1 week. She  is also working with the lymphedema physical therapy clinic. ___________________________________________ Gwen Her. Dianah Field, M.D., ABFM., CAQSM. Primary Care and [place] Instructor of [place] [employer] [facility] [employer]

## 2018-01-23 NOTE — Assessment & Plan Note (Signed)
Well after injection, 60 to 70% improvement, still has some discomfort. May continue ibuprofen as needed. I did give her some information to look into Visco supplementation. She will call and let me know if she would like to proceed and we will work on getting her approved.

## 2018-01-27 ENCOUNTER — Ambulatory Visit (HOSPITAL_COMMUNITY): Payer: 59 | Attending: Sports Medicine | Admitting: Physical Therapy

## 2018-01-27 DIAGNOSIS — M545 Low back pain: Secondary | ICD-10-CM | POA: Insufficient documentation

## 2018-01-27 DIAGNOSIS — M6281 Muscle weakness (generalized): Secondary | ICD-10-CM | POA: Insufficient documentation

## 2018-01-27 DIAGNOSIS — G8929 Other chronic pain: Secondary | ICD-10-CM | POA: Diagnosis present

## 2018-01-27 DIAGNOSIS — I890 Lymphedema, not elsewhere classified: Secondary | ICD-10-CM | POA: Diagnosis present

## 2018-01-27 NOTE — Therapy (Signed)
[place], [place], [address] Phone: [phone]   Fax:  [phone]  Physical Therapy Treatment  Patient Details  Name: Cynthia [name] MRN: [id number] Date of Birth: October 25, 1965 Referring Provider: Aundria Mems   Encounter Date: 01/27/2018  PT End of Session - 01/27/18 1621    Visit Number  2    Number of Visits  18    Date for PT Re-Evaluation  02/25/18    PT Start Time  1448    PT Stop Time  1548    PT Time Calculation (min)  60 min    Activity Tolerance  Patient tolerated treatment well    Behavior During Therapy  [facility] for tasks assessed/performed       Past Medical History:  Diagnosis Date  . Chronic venous insufficiency 09/29/2017  . Deep venous thrombosis of lower extremity ([place]) 11/17/2015  . Depression   . History of DVT (deep vein thrombosis)   . Primary hyperparathyroidism ([place]) 11/28/2017  . Sleep difficulties     Past Surgical History:  Procedure Laterality Date  . COLONOSCOPY  2014  . GASTRIC RESTRICTION SURGERY     lap band  . HERNIA REPAIR      There were no vitals filed for this visit.  Subjective Assessment - 01/27/18 1600    Subjective  Pt comes today wearing knee high compression stockings stating her leg is actually smaller today.      Currently in Pain?  No/denies                       [facility] Adult PT Treatment/Exercise - 01/27/18 0001      Manual Therapy   Manual Therapy  Manual Lymphatic Drainage (MLD);Compression Bandaging    Manual therapy comments  supine and prone    Manual Lymphatic Drainage (MLD)  completed for LT LE routing to Lt axillary, deep and superficial abdominal     Compression Bandaging  will complete next session, foam cut this session for entire Lt LE.             PT Education - 01/27/18 1621    Education Details  reveiwed goals per initial evaulation and therex.  Explained lymphatic system and mechanism of massage.   Shown the compression pump and how to use it.     Person(s) Educated  Patient    Methods  Explanation;Demonstration    Comprehension  Verbalized understanding       PT Short Term Goals - 01/14/18 1316      PT SHORT TERM GOAL #1   Title  PT to be completing LE exercise to promote lymphatic circulation.    Time  1    Period  Weeks    Status  New    Target Date  01/21/18      PT SHORT TERM GOAL #2   Title  Pt to be able to verbalize that lymphedema is a chronic condition and must be maintained by wearing compression garments on a daily basis as well as using a compression pump.     Time  2    Period  Weeks    Status  New    Target Date  01/28/18      PT SHORT TERM GOAL #3   Title  PT to have lost on an average 2 cm of fluid per measurment in Lt LE to decrease risk of cellulitis     Time  3  Period  Weeks    Status  New    Target Date  02/04/18        PT Long Term Goals - 01/14/18 1324      PT LONG TERM GOAL #1   Title  PT to have lost an average of 3-4 cm of volume in LT LE to allow pt to ambulate with no difficulties     Time  6    Period  Weeks    Status  New    Target Date  02/25/18      PT LONG TERM GOAL #2   Title  PT to be able to don and doff her shoes and pants with greater ease.     Time  6    Period  Weeks    Status  New      PT LONG TERM GOAL #3   Title  PT to have acquired both compression garment and compression pump and to be using both   ORder faxed to MD today for both.    Time  6    Period  Weeks    Status  New            Plan - 01/27/18 1622    Clinical Impression Statement  PT returns today with knee high stockings donned and wearing tight leggins.  Explained and demonstated lymphatic pump (order sent to MD for this last visit, however not returned signed; sent for second request today).  Foam cut for Lt LE and thigh and manual lymph drainage completed for LT LE today.  Unable to begin bandaging this session as clothing inappropriate.   Pt instructed to wear clothing to accomodate bandages (either a maxi skirt or palooza pants).      Rehab Potential  Good    PT Frequency  3x / week    PT Duration  6 weeks    PT Treatment/Interventions  Manual lymph drainage;Compression bandaging;Therapeutic exercise;ADLs/Self Care Home Management    PT Next Visit Plan  Continue with  total decongestive techniques followed by compression bandaging of LT LE.  Rt LE is involved therefore do not route fluid to RT.  May progress to RT bandaging in the future if it appears that it will be benefical.  MEasure weekly (wednesdays).  Check on return of compression pump order and fax to [facility] when returned.     PT Home Exercise Plan  to improve LE lymphatic circulation.     Consulted and Agree with Plan of Care  Patient       Patient will benefit from skilled therapeutic intervention in order to improve the following deficits and impairments:  Decreased activity tolerance, Difficulty walking, Increased edema, Pain  Visit Diagnosis: Lymphedema, not elsewhere classified     Problem List Patient Active Problem List   Diagnosis Date Noted  . Primary hyperparathyroidism ([place]) 11/28/2017  . Microcytic anemia 11/13/2017  . Low mean corpuscular volume (MCV) 11/13/2017  . Hypercalcemia 11/13/2017  . Sacroiliac joint dysfunction of both sides 10/21/2017  . Lumbar degenerative disc disease 10/21/2017  . Abnormal weight gain 10/21/2017  . Lymphedema 09/29/2017  . Primary osteoarthritis of left knee 09/19/2017  . Peripheral edema 09/19/2017  . Morbid obesity with BMI of 40.0-44.9, adult ([place]) 09/18/2017  . Urinary incontinence 09/18/2017  . History of DVT (deep vein thrombosis)   . Lesion of breast 06/18/2017  . LGSIL on Pap smear of cervix 11/22/2015  . Migraine 11/17/2015  . Vitamin D deficiency 11/17/2015  .  Constipation-normal barium enema Sept 2013 02/06/2012  . Lapband APL + HH repair (1 suture) 01/28/2012   Teena Irani,  PTA/CLT [id number]  Teena Irani 01/27/2018, 4:36 PM  [facility] [address] [place], [place], [address] Phone: [phone]   Fax:  [phone]  Name: Cynthia [name] MRN: [id number] Date of Birth: 08-22-1965

## 2018-01-29 ENCOUNTER — Ambulatory Visit (HOSPITAL_COMMUNITY): Payer: 59 | Admitting: Physical Therapy

## 2018-01-29 ENCOUNTER — Telehealth: Payer: Self-pay

## 2018-01-29 DIAGNOSIS — I890 Lymphedema, not elsewhere classified: Secondary | ICD-10-CM | POA: Diagnosis not present

## 2018-01-29 DIAGNOSIS — M6281 Muscle weakness (generalized): Secondary | ICD-10-CM | POA: Diagnosis not present

## 2018-01-29 DIAGNOSIS — G8929 Other chronic pain: Secondary | ICD-10-CM

## 2018-01-29 DIAGNOSIS — M545 Low back pain: Secondary | ICD-10-CM | POA: Diagnosis not present

## 2018-01-29 NOTE — Therapy (Signed)
[facility] [address] [place], [place], [address] Phone: [phone]   Fax:  [phone]  Physical Therapy Treatment  Patient Details  Name: Cynthia [name] MRN: [id number] Date of Birth: 1965/08/08 Referring Provider: Aundria Mems   Encounter Date: 01/29/2018  PT End of Session - 01/29/18 1158    Visit Number  3    Number of Visits  18    Date for PT Re-Evaluation  02/25/18    PT Start Time  1033    PT Stop Time  1135    PT Time Calculation (min)  62 min    Activity Tolerance  Patient tolerated treatment well    Behavior During Therapy  [facility] for tasks assessed/performed       Past Medical History:  Diagnosis Date  . Chronic venous insufficiency 09/29/2017  . Deep venous thrombosis of lower extremity ([place]) 11/17/2015  . Depression   . History of DVT (deep vein thrombosis)   . Primary hyperparathyroidism ([place]) 11/28/2017  . Sleep difficulties     Past Surgical History:  Procedure Laterality Date  . COLONOSCOPY  2014  . GASTRIC RESTRICTION SURGERY     lap band  . HERNIA REPAIR      There were no vitals filed for this visit.  Subjective Assessment - 01/29/18 1156    Subjective  Pt returns today ready to be bandaged.  No issues to report.      Currently in Pain?  No/denies                       [facility] Adult PT Treatment/Exercise - 01/29/18 0001      Manual Therapy   Manual Therapy  Manual Lymphatic Drainage (MLD);Compression Bandaging    Manual therapy comments  supine and prone    Manual Lymphatic Drainage (MLD)  completed for LT LE routing to Lt axillary, deep and superficial abdominal     Compression Bandaging  for entire Lt LE including toe bandaging using multilayer compression bandages and 1/2" foam               PT Short Term Goals - 01/14/18 1316      PT SHORT TERM GOAL #1   Title  PT to be completing LE exercise to promote lymphatic circulation.    Time  1    Period   Weeks    Status  New    Target Date  01/21/18      PT SHORT TERM GOAL #2   Title  Pt to be able to verbalize that lymphedema is a chronic condition and must be maintained by wearing compression garments on a daily basis as well as using a compression pump.     Time  2    Period  Weeks    Status  New    Target Date  01/28/18      PT SHORT TERM GOAL #3   Title  PT to have lost on an average 2 cm of fluid per measurment in Lt LE to decrease risk of cellulitis     Time  3    Period  Weeks    Status  New    Target Date  02/04/18        PT Long Term Goals - 01/14/18 1324      PT LONG TERM GOAL #1   Title  PT to have lost an average of 3-4 cm of volume in LT LE to allow pt  to ambulate with no difficulties     Time  6    Period  Weeks    Status  New    Target Date  02/25/18      PT LONG TERM GOAL #2   Title  PT to be able to don and doff her shoes and pants with greater ease.     Time  6    Period  Weeks    Status  New      PT LONG TERM GOAL #3   Title  PT to have acquired both compression garment and compression pump and to be using both   ORder faxed to MD today for both.    Time  6    Period  Weeks    Status  New            Plan - 01/29/18 1158    Clinical Impression Statement  Manual decongestive techniques completed in supine and prone today.  Began bandaging for entire LT LE with pt reporting overall comfort.  Pt instructed to keep bandages intact and remove prior to next appt and re-roll.  PT verbalized understanding.      Rehab Potential  Good    PT Frequency  3x / week    PT Duration  6 weeks    PT Treatment/Interventions  Manual lymph drainage;Compression bandaging;Therapeutic exercise;ADLs/Self Care Home Management    PT Next Visit Plan  Continue with  total decongestive techniques followed by compression bandaging of LT LE.  Rt LE is involved therefore do not route fluid to RT.  May progress to RT bandaging in the future if it appears that it will be  benefical.  Measure weekly (Friday).  Check on return of compression pump order and fax to [facility] when returned.     PT Home Exercise Plan  to improve LE lymphatic circulation.     Consulted and Agree with Plan of Care  Patient       Patient will benefit from skilled therapeutic intervention in order to improve the following deficits and impairments:  Decreased activity tolerance, Difficulty walking, Increased edema, Pain  Visit Diagnosis: Lymphedema, not elsewhere classified  Chronic bilateral low back pain without sciatica  Muscle weakness (generalized)     Problem List Patient Active Problem List   Diagnosis Date Noted  . Primary hyperparathyroidism ([place]) 11/28/2017  . Microcytic anemia 11/13/2017  . Low mean corpuscular volume (MCV) 11/13/2017  . Hypercalcemia 11/13/2017  . Sacroiliac joint dysfunction of both sides 10/21/2017  . Lumbar degenerative disc disease 10/21/2017  . Abnormal weight gain 10/21/2017  . Lymphedema 09/29/2017  . Primary osteoarthritis of left knee 09/19/2017  . Peripheral edema 09/19/2017  . Morbid obesity with BMI of 40.0-44.9, adult ([place]) 09/18/2017  . Urinary incontinence 09/18/2017  . History of DVT (deep vein thrombosis)   . Lesion of breast 06/18/2017  . LGSIL on Pap smear of cervix 11/22/2015  . Migraine 11/17/2015  . Vitamin D deficiency 11/17/2015  . Constipation-normal barium enema Sept 2013 02/06/2012  . Lapband APL + HH repair (1 suture) 01/28/2012   Teena Irani, PTA/CLT [phone]  Teena Irani 01/29/2018, 12:00 PM  [place], [place], [address] Phone: [phone]   Fax:  [phone]  Name: Cynthia [name] MRN: [id number] Date of Birth: Sep 14, 1965

## 2018-01-29 NOTE — Telephone Encounter (Signed)
She should ask lymphedema clinic about the shoe I have completed handicap form (in [employer] in basket)

## 2018-01-29 NOTE — Telephone Encounter (Signed)
Pt went to lymphedema clinic and had her first wrap today. Pt reports she is wrapped "from feet to groin" and was anticipating going to work Midwife.   Pt requesting a shoe boot to be ordered, since she cannot fit in any of her current shoes and she must have closed-toe shoe.  Pt also requesting a temporary handicapped sticker.  Evlyn Clines, do these orders/items need to come from you? Please advise

## 2018-01-30 NOTE — Telephone Encounter (Signed)
Pt informed -EH/RMA

## 2018-01-31 ENCOUNTER — Ambulatory Visit (HOSPITAL_COMMUNITY): Payer: 59 | Admitting: Physical Therapy

## 2018-01-31 DIAGNOSIS — G8929 Other chronic pain: Secondary | ICD-10-CM | POA: Diagnosis not present

## 2018-01-31 DIAGNOSIS — I890 Lymphedema, not elsewhere classified: Secondary | ICD-10-CM | POA: Diagnosis not present

## 2018-01-31 DIAGNOSIS — M6281 Muscle weakness (generalized): Secondary | ICD-10-CM | POA: Diagnosis not present

## 2018-01-31 DIAGNOSIS — M545 Low back pain: Secondary | ICD-10-CM | POA: Diagnosis not present

## 2018-01-31 NOTE — Therapy (Signed)
[facility] [address] [place], [place], [address] Phone: [phone]   Fax:  [phone]  Physical Therapy Treatment  Patient Details  Name: Cynthia [name] MRN: [id number] Date of Birth: 01/12/1966 Referring Provider: Aundria Mems   Encounter Date: 01/31/2018  PT End of Session - 01/31/18 1202    Visit Number  4    Number of Visits  18    Date for PT Re-Evaluation  02/25/18    PT Start Time  8786    PT Stop Time  1158    PT Time Calculation (min)  83 min    Activity Tolerance  Patient tolerated treatment well    Behavior During Therapy  [facility] for tasks assessed/performed       Past Medical History:  Diagnosis Date  . Chronic venous insufficiency 09/29/2017  . Deep venous thrombosis of lower extremity ([place]) 11/17/2015  . Depression   . History of DVT (deep vein thrombosis)   . Primary hyperparathyroidism ([place]) 11/28/2017  . Sleep difficulties     Past Surgical History:  Procedure Laterality Date  . COLONOSCOPY  2014  . GASTRIC RESTRICTION SURGERY     lap band  . HERNIA REPAIR      There were no vitals filed for this visit.  Subjective Assessment - 01/31/18 1158    Subjective  PT states that the thigh bandages fell off after about a day but she noticed that her thigh was smaller than normal.  Pt comes with LE still bandaged; upon removal of bandages pt states that she can not remember when her left leg was so small.  She is tickled with her reduction.     Pertinent History  DVT    Currently in Pain?  No/denies            LYMPHEDEMA/ONCOLOGY QUESTIONNAIRE - 01/31/18 1038      Lymphedema Assessments   Lymphedema Assessments  Lower extremities  (Pended)       Left Lower Extremity Lymphedema   20 cm Proximal to Suprapatella  70.7 cm  (Pended)     10 cm Proximal to Suprapatella  62 cm  (Pended)     At Midpatella/Popliteal Crease  48 cm  (Pended)     30 cm Proximal to Floor at Lateral Plantar Foot  38.8  cm  (Pended)     20 cm Proximal to Floor at Lateral Plantar Foot  31 cm  (Pended)     10 cm Proximal to Floor at Lateral Malleoli  27.8 cm  (Pended)     Circumference of ankle/heel  34.5 cm.  (Pended)     5 cm Proximal to 1st MTP Joint  24.5 cm  (Pended)     Across MTP Joint  23.8 cm  (Pended)     Around Proximal Great Toe  8.5 cm  (Pended)                 [facility] Adult PT Treatment/Exercise - 01/31/18 0001      Manual Therapy   Manual Therapy  Manual Lymphatic Drainage (MLD);Compression Bandaging    Manual therapy comments  supine and prone    Manual Lymphatic Drainage (MLD)  completed for LT LE routing to Lt axillary, deep and superficial abdominal , inguinal axillary anastomisis followed by Lt LT; Short Rt LE completed as well     Compression Bandaging  for entire Lt LE including toe bandaging using multilayer compression bandages and 1/2" foam  PT Short Term Goals - 01/31/18 1204      PT SHORT TERM GOAL #1   Title  PT to be completing LE exercise to promote lymphatic circulation.    Time  1    Period  Weeks    Status  On-going      PT SHORT TERM GOAL #2   Title  Pt to be able to verbalize that lymphedema is a chronic condition and must be maintained by wearing compression garments on a daily basis as well as using a compression pump.     Time  2    Period  Weeks    Status  On-going      PT SHORT TERM GOAL #3   Title  PT to have lost on an average 2 cm of fluid per measurment in Lt LE to decrease risk of cellulitis     Time  3    Period  Weeks    Status  Achieved        PT Long Term Goals - 01/31/18 1204      PT LONG TERM GOAL #1   Title  PT to have lost an average of 3-4 cm of volume in LT LE to allow pt to ambulate with no difficulties     Time  6    Period  Weeks    Status  On-going      PT LONG TERM GOAL #2   Title  PT to be able to don and doff her shoes and pants with greater ease.     Time  6    Period  Weeks    Status   On-going      PT LONG TERM GOAL #3   Title  PT to have acquired both compression garment and compression pump and to be using both   ORder faxed to MD today for both.    Time  6    Period  Weeks    Status  On-going            Plan - 01/31/18 1202    Clinical Impression Statement  Pt remeasured today with some increase volume noted in thigh area, (compression wrap fell off), but reduction in all aspects at knee level and below.  Pt very pleased with reduction.  Pt comes without a compression garment donned on her Rt LE therapist emphasized the importance of wearing her compression everyday.      Rehab Potential  Good    PT Frequency  3x / week    PT Duration  6 weeks    PT Treatment/Interventions  Manual lymph drainage;Compression bandaging;Therapeutic exercise;ADLs/Self Care Home Management    PT Next Visit Plan  Continue with  total decongestive techniques followed by compression bandaging of LT LE.  Rt LE is involved therefore do not route fluid to RT.  May progress to RT bandaging in the future if it appears that it will be benefical.  Measure weekly (Friday).  Check on return of compression pump order and fax to [facility] when returned.     PT Home Exercise Plan  to improve LE lymphatic circulation.     Consulted and Agree with Plan of Care  Patient       Patient will benefit from skilled therapeutic intervention in order to improve the following deficits and impairments:  Decreased activity tolerance, Difficulty walking, Increased edema, Pain  Visit Diagnosis: Lymphedema, not elsewhere classified     Problem List Patient Active Problem List  Diagnosis Date Noted  . Primary hyperparathyroidism ([place]) 11/28/2017  . Microcytic anemia 11/13/2017  . Low mean corpuscular volume (MCV) 11/13/2017  . Hypercalcemia 11/13/2017  . Sacroiliac joint dysfunction of both sides 10/21/2017  . Lumbar degenerative disc disease 10/21/2017  . Abnormal weight gain 10/21/2017  .  Lymphedema 09/29/2017  . Primary osteoarthritis of left knee 09/19/2017  . Peripheral edema 09/19/2017  . Morbid obesity with BMI of 40.0-44.9, adult ([place]) 09/18/2017  . Urinary incontinence 09/18/2017  . History of DVT (deep vein thrombosis)   . Lesion of breast 06/18/2017  . LGSIL on Pap smear of cervix 11/22/2015  . Migraine 11/17/2015  . Vitamin D deficiency 11/17/2015  . Constipation-normal barium enema Sept 2013 02/06/2012  . Lapband APL + HH repair (1 suture) 01/28/2012    Rayetta Humphrey, PT CLT [phone] 01/31/2018, 12:05 PM  [facility] [address] [place], [place], [address] Phone: [phone]   Fax:  [phone]  Name: Cynthia [name] MRN: [id number] Date of Birth: 12-22-1965

## 2018-02-03 ENCOUNTER — Telehealth (HOSPITAL_COMMUNITY): Payer: Self-pay | Admitting: Physical Therapy

## 2018-02-03 ENCOUNTER — Ambulatory Visit (HOSPITAL_COMMUNITY): Payer: 59 | Admitting: Physical Therapy

## 2018-02-03 DIAGNOSIS — I890 Lymphedema, not elsewhere classified: Secondary | ICD-10-CM

## 2018-02-03 DIAGNOSIS — M6281 Muscle weakness (generalized): Secondary | ICD-10-CM | POA: Diagnosis not present

## 2018-02-03 DIAGNOSIS — M545 Low back pain: Secondary | ICD-10-CM | POA: Diagnosis not present

## 2018-02-03 DIAGNOSIS — G8929 Other chronic pain: Secondary | ICD-10-CM | POA: Diagnosis not present

## 2018-02-03 NOTE — Telephone Encounter (Signed)
Order received for compression garments and lymphedema pump.  Order and demographics faxed to [facility] Teena Irani, PTA/CLT [phone]

## 2018-02-03 NOTE — Therapy (Signed)
[facility] [address] [place], [place], [address] Phone: [phone]   Fax:  [phone]  Physical Therapy Treatment  Patient Details  Name: Cynthia [name] MRN: [id number] Date of Birth: May 04, 1966 Referring Provider: Aundria Mems   Encounter Date: 02/03/2018  PT End of Session - 02/03/18 1036    Visit Number  5    Number of Visits  18    Date for PT Re-Evaluation  02/25/18    PT Start Time  0904    PT Stop Time  1018    PT Time Calculation (min)  74 min    Activity Tolerance  Patient tolerated treatment well    Behavior During Therapy  [facility] for tasks assessed/performed       Past Medical History:  Diagnosis Date  . Chronic venous insufficiency 09/29/2017  . Deep venous thrombosis of lower extremity ([place]) 11/17/2015  . Depression   . History of DVT (deep vein thrombosis)   . Primary hyperparathyroidism ([place]) 11/28/2017  . Sleep difficulties     Past Surgical History:  Procedure Laterality Date  . COLONOSCOPY  2014  . GASTRIC RESTRICTION SURGERY     lap band  . HERNIA REPAIR      There were no vitals filed for this visit.  Subjective Assessment - 02/03/18 1035    Subjective  Pt states she removed the bandages on Saturday due to her leg hurting.  States it is more swollen today than it was on Friday.  STates it's hard to keep the thigh portion on                        [facility] Adult PT Treatment/Exercise - 02/03/18 0001      Manual Therapy   Manual Therapy  Manual Lymphatic Drainage (MLD);Compression Bandaging    Manual therapy comments  supine and prone    Manual Lymphatic Drainage (MLD)  completed for LT LE routing to Lt axillary, deep and superficial abdominal , inguinal axillary anastomisis followed by Lt LT; Short Rt LE completed as well     Compression Bandaging  for entire Lt LE including toe bandaging using multilayer compression bandages and 1/2" foam             PT Education  - 02/03/18 1041    Education Details  bandaging for LE with written instructions    Person(s) Educated  Patient    Methods  Explanation;Demonstration;Tactile cues;Verbal cues;Handout    Comprehension  Verbalized understanding       PT Short Term Goals - 01/31/18 1204      PT SHORT TERM GOAL #1   Title  PT to be completing LE exercise to promote lymphatic circulation.    Time  1    Period  Weeks    Status  On-going      PT SHORT TERM GOAL #2   Title  Pt to be able to verbalize that lymphedema is a chronic condition and must be maintained by wearing compression garments on a daily basis as well as using a compression pump.     Time  2    Period  Weeks    Status  On-going      PT SHORT TERM GOAL #3   Title  PT to have lost on an average 2 cm of fluid per measurment in Lt LE to decrease risk of cellulitis     Time  3    Period  Weeks  Status  Achieved        PT Long Term Goals - 01/31/18 1204      PT LONG TERM GOAL #1   Title  PT to have lost an average of 3-4 cm of volume in LT LE to allow pt to ambulate with no difficulties     Time  6    Period  Weeks    Status  On-going      PT LONG TERM GOAL #2   Title  PT to be able to don and doff her shoes and pants with greater ease.     Time  6    Period  Weeks    Status  On-going      PT LONG TERM GOAL #3   Title  PT to have acquired both compression garment and compression pump and to be using both   ORder faxed to MD today for both.    Time  6    Period  Weeks    Status  On-going            Plan - 02/03/18 1039    Clinical Impression Statement  Continued with manual lymph drainage for Lt LE followed by compression bandaging to LE foot to thigh.  Pt given written instructions and demonstration on bandaging LE per request.  Pt verbalized understanding.     Rehab Potential  Good    PT Frequency  3x / week    PT Duration  6 weeks    PT Treatment/Interventions  Manual lymph drainage;Compression  bandaging;Therapeutic exercise;ADLs/Self Care Home Management    PT Next Visit Plan  Continue with  total decongestive techniques followed by compression bandaging of LT LE.  Rt LE is involved therefore do not route fluid to RT.  May progress to RT bandaging in the future if it appears that it will be benefical.  Measure weekly (Friday).  Check on return of compression pump order and fax to [facility] when returned.     PT Home Exercise Plan  to improve LE lymphatic circulation.     Consulted and Agree with Plan of Care  Patient       Patient will benefit from skilled therapeutic intervention in order to improve the following deficits and impairments:  Decreased activity tolerance, Difficulty walking, Increased edema, Pain  Visit Diagnosis: Lymphedema, not elsewhere classified     Problem List Patient Active Problem List   Diagnosis Date Noted  . Primary hyperparathyroidism ([place]) 11/28/2017  . Microcytic anemia 11/13/2017  . Low mean corpuscular volume (MCV) 11/13/2017  . Hypercalcemia 11/13/2017  . Sacroiliac joint dysfunction of both sides 10/21/2017  . Lumbar degenerative disc disease 10/21/2017  . Abnormal weight gain 10/21/2017  . Lymphedema 09/29/2017  . Primary osteoarthritis of left knee 09/19/2017  . Peripheral edema 09/19/2017  . Morbid obesity with BMI of 40.0-44.9, adult ([place]) 09/18/2017  . Urinary incontinence 09/18/2017  . History of DVT (deep vein thrombosis)   . Lesion of breast 06/18/2017  . LGSIL on Pap smear of cervix 11/22/2015  . Migraine 11/17/2015  . Vitamin D deficiency 11/17/2015  . Constipation-normal barium enema Sept 2013 02/06/2012  . Lapband APL + HH repair (1 suture) 01/28/2012   Teena Irani, PTA/CLT [phone]  Teena Irani 02/03/2018, 10:41 AM  [facility] [place], [place], [address] Phone: [phone]   Fax:  [phone]  Name: CREASIE LACOSSE MRN: [id number] Date  of Birth: May 06, 1966

## 2018-02-04 ENCOUNTER — Encounter: Payer: Self-pay | Admitting: Sports Medicine

## 2018-02-05 ENCOUNTER — Ambulatory Visit (HOSPITAL_COMMUNITY): Payer: 59 | Admitting: Physical Therapy

## 2018-02-05 DIAGNOSIS — I890 Lymphedema, not elsewhere classified: Secondary | ICD-10-CM

## 2018-02-05 DIAGNOSIS — M6281 Muscle weakness (generalized): Secondary | ICD-10-CM | POA: Diagnosis not present

## 2018-02-05 DIAGNOSIS — G8929 Other chronic pain: Secondary | ICD-10-CM | POA: Diagnosis not present

## 2018-02-05 DIAGNOSIS — M545 Low back pain: Secondary | ICD-10-CM | POA: Diagnosis not present

## 2018-02-05 NOTE — Therapy (Signed)
[facility] [address] [place], [place], [address] Phone: [phone]   Fax:  [phone]  Physical Therapy Treatment  Patient Details  Name: Cynthia [name] MRN: [id number] Date of Birth: [birth year] Referring Provider: Aundria Mems   Encounter Date: 02/05/2018  PT End of Session - 02/05/18 1711    Visit Number  6    Number of Visits  18    Date for PT Re-Evaluation  02/25/18    PT Start Time  4259    PT Stop Time  1620    PT Time Calculation (min)  63 min    Activity Tolerance  Patient tolerated treatment well    Behavior During Therapy  [facility] for tasks assessed/performed       Past Medical History:  Diagnosis Date  . Chronic venous insufficiency 09/29/2017  . Deep venous thrombosis of lower extremity ([place]) 11/17/2015  . Depression   . History of DVT (deep vein thrombosis)   . Primary hyperparathyroidism ([place]) 11/28/2017  . Sleep difficulties     Past Surgical History:  Procedure Laterality Date  . COLONOSCOPY  2014  . GASTRIC RESTRICTION SURGERY     lap band  . HERNIA REPAIR      There were no vitals filed for this visit.  Subjective Assessment - 02/05/18 1709    Subjective  PT states her leg is getting smaller.  PT requests to hold thigh bandages today as she is taking IBS meds,                         [facility] Adult PT Treatment/Exercise - 02/05/18 0001      Manual Therapy   Manual Therapy  Manual Lymphatic Drainage (MLD);Compression Bandaging    Manual therapy comments  supine and prone    Manual Lymphatic Drainage (MLD)  completed for LT LE routing to Lt axillary, deep and superficial abdominal , inguinal axillary anastomisis followed by Lt LT; Short Rt LE completed as well     Compression Bandaging  for distal Lt LE without toe bandaging using multilayer compression bandages and 1/2" foam               PT Short Term Goals - 01/31/18 1204      PT SHORT TERM GOAL #1   Title   PT to be completing LE exercise to promote lymphatic circulation.    Time  1    Period  Weeks    Status  On-going      PT SHORT TERM GOAL #2   Title  Pt to be able to verbalize that lymphedema is a chronic condition and must be maintained by wearing compression garments on a daily basis as well as using a compression pump.     Time  2    Period  Weeks    Status  On-going      PT SHORT TERM GOAL #3   Title  PT to have lost on an average 2 cm of fluid per measurment in Lt LE to decrease risk of cellulitis     Time  3    Period  Weeks    Status  Achieved        PT Long Term Goals - 01/31/18 1204      PT LONG TERM GOAL #1   Title  PT to have lost an average of 3-4 cm of volume in LT LE to allow pt to ambulate with no difficulties  Time  6    Period  Weeks    Status  On-going      PT LONG TERM GOAL #2   Title  PT to be able to don and doff her shoes and pants with greater ease.     Time  6    Period  Weeks    Status  On-going      PT LONG TERM GOAL #3   Title  PT to have acquired both compression garment and compression pump and to be using both   ORder faxed to MD today for both.    Time  6    Period  Weeks    Status  On-going            Plan - 02/05/18 1727    Clinical Impression Statement  Pt received contact from [facility] yesterday and is expected to get her pump next week.  completed manual both anterior and posteriorly for Lt LE.    bandaging only applied to distal Lt LE per patient request.  also held toe wraps to see if any change (pt reports no problems with toes anyway).      Rehab Potential  Good    PT Frequency  3x / week    PT Duration  6 weeks    PT Treatment/Interventions  Manual lymph drainage;Compression bandaging;Therapeutic exercise;ADLs/Self Care Home Management    PT Next Visit Plan  Continue with  total decongestive techniques followed by compression bandaging of LT LE.  Rt LE is involved therefore do not route fluid to RT.  May progress  to RT bandaging in the future if it appears that it will be benefical.  Measure weekly (Friday).     PT Home Exercise Plan  to improve LE lymphatic circulation.     Consulted and Agree with Plan of Care  Patient       Patient will benefit from skilled therapeutic intervention in order to improve the following deficits and impairments:  Decreased activity tolerance, Difficulty walking, Increased edema, Pain  Visit Diagnosis: Lymphedema, not elsewhere classified     Problem List Patient Active Problem List   Diagnosis Date Noted  . Primary hyperparathyroidism ([place]) 11/28/2017  . Microcytic anemia 11/13/2017  . Low mean corpuscular volume (MCV) 11/13/2017  . Hypercalcemia 11/13/2017  . Sacroiliac joint dysfunction of both sides 10/21/2017  . Lumbar degenerative disc disease 10/21/2017  . Abnormal weight gain 10/21/2017  . Lymphedema 09/29/2017  . Primary osteoarthritis of left knee 09/19/2017  . Peripheral edema 09/19/2017  . Morbid obesity with BMI of 40.0-44.9, adult ([place]) 09/18/2017  . Urinary incontinence 09/18/2017  . History of DVT (deep vein thrombosis)   . Lesion of breast 06/18/2017  . LGSIL on Pap smear of cervix 11/22/2015  . Migraine 11/17/2015  . Vitamin D deficiency 11/17/2015  . Constipation-normal barium enema Sept 2013 02/06/2012  . Lapband APL + HH repair (1 suture) 01/28/2012   Teena Irani, PTA/CLT [phone]  Teena Irani 02/05/2018, 5:29 PM  [facility] [address] [place], [place], [address] Phone: [phone]   Fax:  [phone]  Name: Cynthia [name] MRN: [id number] Date of Birth: [birth year]

## 2018-02-07 ENCOUNTER — Ambulatory Visit (HOSPITAL_COMMUNITY): Payer: 59 | Admitting: Physical Therapy

## 2018-02-07 ENCOUNTER — Telehealth (HOSPITAL_COMMUNITY): Payer: Self-pay | Admitting: Physical Therapy

## 2018-02-07 NOTE — Telephone Encounter (Signed)
She had a accident and will not make today's appt.

## 2018-02-10 ENCOUNTER — Ambulatory Visit (HOSPITAL_COMMUNITY): Payer: 59 | Admitting: Physical Therapy

## 2018-02-10 DIAGNOSIS — G8929 Other chronic pain: Secondary | ICD-10-CM | POA: Diagnosis not present

## 2018-02-10 DIAGNOSIS — M545 Low back pain: Secondary | ICD-10-CM | POA: Diagnosis not present

## 2018-02-10 DIAGNOSIS — M6281 Muscle weakness (generalized): Secondary | ICD-10-CM | POA: Diagnosis not present

## 2018-02-10 DIAGNOSIS — I890 Lymphedema, not elsewhere classified: Secondary | ICD-10-CM

## 2018-02-10 NOTE — Therapy (Signed)
[facility] [address] [place], [place], [address] Phone: [phone]   Fax:  [phone]  Physical Therapy Treatment  Patient Details  Name: Cynthia [name] MRN: [id number] Date of Birth: 1966-04-05 Referring Provider: Aundria Mems   Encounter Date: 02/10/2018  PT End of Session - 02/10/18 1612    Visit Number  7    Number of Visits  18    Date for PT Re-Evaluation  02/25/18    PT Start Time  6546    PT Stop Time  1544    PT Time Calculation (min)  68 min    Activity Tolerance  Patient tolerated treatment well    Behavior During Therapy  [facility] for tasks assessed/performed       Past Medical History:  Diagnosis Date  . Chronic venous insufficiency 09/29/2017  . Deep venous thrombosis of lower extremity ([place]) 11/17/2015  . Depression   . History of DVT (deep vein thrombosis)   . Primary hyperparathyroidism ([place]) 11/28/2017  . Sleep difficulties     Past Surgical History:  Procedure Laterality Date  . COLONOSCOPY  2014  . GASTRIC RESTRICTION SURGERY     lap band  . HERNIA REPAIR      There were no vitals filed for this visit.  Subjective Assessment - 02/10/18 1610    Subjective  Pt was unable to be here Friday but she kept the bandages donned.    Pertinent History  DVT    Currently in Pain?  No/denies            LYMPHEDEMA/ONCOLOGY QUESTIONNAIRE - 02/10/18 1442      Left Lower Extremity Lymphedema   20 cm Proximal to Suprapatella  68 cm    10 cm Proximal to Suprapatella  59.8 cm    At Midpatella/Popliteal Crease  48.6 cm    30 cm Proximal to Floor at Lateral Plantar Foot  42 cm    20 cm Proximal to Floor at Lateral Plantar Foot  33.7 cm    10 cm Proximal to Floor at Lateral Malleoli  33.5 cm    Circumference of ankle/heel  35.2 cm.    5 cm Proximal to 1st MTP Joint  25.5 cm    Across MTP Joint  25 cm    Around Proximal Great Toe  9 cm                [facility] Adult PT Treatment/Exercise -  02/10/18 0001      Self-Care   Self-Care  Other Self-Care Comments    Other Self-Care Comments   self manual techniques.       Manual Therapy   Manual Therapy  Manual Lymphatic Drainage (MLD);Compression Bandaging    Manual therapy comments  supine and prone    Manual Lymphatic Drainage (MLD)  completed for LT LE routing to Lt axillary, deep and superficial abdominal , inguinal axillary anastomisis followed by Lt LT; Short Rt LE completed as well     Compression Bandaging  for entire  Lt LE without toe bandaging using multilayer compression bandages and 1/2" foam             PT Education - 02/10/18 1611    Education Details  self manual techniques     Person(s) Educated  Patient    Methods  Explanation;Demonstration;Tactile cues;Verbal cues;Handout    Comprehension  Verbalized understanding;Returned demonstration       PT Short Term Goals - 02/10/18 1615  PT SHORT TERM GOAL #1   Title  PT to be completing LE exercise to promote lymphatic circulation.    Time  1    Period  Weeks    Status  Achieved      PT SHORT TERM GOAL #2   Title  Pt to be able to verbalize that lymphedema is a chronic condition and must be maintained by wearing compression garments on a daily basis as well as using a compression pump.     Time  2    Period  Weeks    Status  Achieved      PT SHORT TERM GOAL #3   Title  PT to have lost on an average 2 cm of fluid per measurment in Lt LE to decrease risk of cellulitis     Time  3    Period  Weeks    Status  Achieved        PT Long Term Goals - 01/31/18 1204      PT LONG TERM GOAL #1   Title  PT to have lost an average of 3-4 cm of volume in LT LE to allow pt to ambulate with no difficulties     Time  6    Period  Weeks    Status  On-going      PT LONG TERM GOAL #2   Title  PT to be able to don and doff her shoes and pants with greater ease.     Time  6    Period  Weeks    Status  On-going      PT LONG TERM GOAL #3   Title  PT to  have acquired both compression garment and compression pump and to be using both   ORder faxed to MD today for both.    Time  6    Period  Weeks    Status  On-going            Plan - 02/10/18 1612    Clinical Impression Statement  Pt measured today as she was unable to attend session Friday.  Volumes are down in pt thigh area but up in her LE.  Pt instructed in self massage and given instruction sheet as well as given sheet on where she can obtain compression garments.  Pt was contacted about pump and should be recieving it this week.      Rehab Potential  Good    PT Frequency  3x / week    PT Duration  6 weeks    PT Treatment/Interventions  Manual lymph drainage;Compression bandaging;Therapeutic exercise;ADLs/Self Care Home Management    PT Next Visit Plan  Continue with  total decongestive techniques followed by compression bandaging of LT LE.   Measure on Mondays.  Answer any quesiongs on self massage technique.     PT Home Exercise Plan  to improve LE lymphatic circulation.     Consulted and Agree with Plan of Care  Patient       Patient will benefit from skilled therapeutic intervention in order to improve the following deficits and impairments:  Decreased activity tolerance, Difficulty walking, Increased edema, Pain  Visit Diagnosis: Lymphedema, not elsewhere classified     Problem List Patient Active Problem List   Diagnosis Date Noted  . Primary hyperparathyroidism ([place]) 11/28/2017  . Microcytic anemia 11/13/2017  . Low mean corpuscular volume (MCV) 11/13/2017  . Hypercalcemia 11/13/2017  . Sacroiliac joint dysfunction of both sides 10/21/2017  . Lumbar degenerative  disc disease 10/21/2017  . Abnormal weight gain 10/21/2017  . Lymphedema 09/29/2017  . Primary osteoarthritis of left knee 09/19/2017  . Peripheral edema 09/19/2017  . Morbid obesity with BMI of 40.0-44.9, adult ([place]) 09/18/2017  . Urinary incontinence 09/18/2017  . History of DVT (deep vein  thrombosis)   . Lesion of breast 06/18/2017  . LGSIL on Pap smear of cervix 11/22/2015  . Migraine 11/17/2015  . Vitamin D deficiency 11/17/2015  . Constipation-normal barium enema Sept 2013 02/06/2012  . Lapband APL + HH repair (1 suture) 01/28/2012    Rayetta Humphrey, PT CLT [phone] 02/10/2018, 4:16 PM  [facility] [address] [place], [place], [address] Phone: [phone]   Fax:  [phone]  Name: Cynthia [name] MRN: [id number] Date of Birth: 1965-06-12

## 2018-02-12 ENCOUNTER — Telehealth (HOSPITAL_COMMUNITY): Payer: Self-pay | Admitting: Physical Therapy

## 2018-02-12 ENCOUNTER — Ambulatory Visit (HOSPITAL_COMMUNITY): Payer: 59 | Admitting: Physical Therapy

## 2018-02-12 NOTE — Telephone Encounter (Signed)
Patient canceled her appt she is having car trouble.

## 2018-02-14 ENCOUNTER — Telehealth (HOSPITAL_COMMUNITY): Payer: Self-pay | Admitting: Physical Therapy

## 2018-02-14 ENCOUNTER — Ambulatory Visit (HOSPITAL_COMMUNITY): Payer: 59 | Admitting: Physical Therapy

## 2018-02-14 NOTE — Telephone Encounter (Signed)
Patient left a message to cancel her for today, she is still having car trouble.

## 2018-02-16 ENCOUNTER — Encounter: Payer: Self-pay | Admitting: Physician Assistant

## 2018-02-16 DIAGNOSIS — R609 Edema, unspecified: Secondary | ICD-10-CM

## 2018-02-17 ENCOUNTER — Telehealth (HOSPITAL_COMMUNITY): Payer: Self-pay | Admitting: Physician Assistant

## 2018-02-17 ENCOUNTER — Ambulatory Visit (HOSPITAL_COMMUNITY): Payer: 59 | Admitting: Physical Therapy

## 2018-02-17 MED ORDER — T.E.D. BELOW KNEE/X-LARGE MISC: 0 refills | Status: DC

## 2018-02-17 NOTE — Telephone Encounter (Signed)
02/17/18  pt left a message to cx but gave no reason for cx

## 2018-02-18 MED ORDER — NONFORMULARY OR COMPOUNDED ITEM: 0 refills | Status: DC

## 2018-02-18 NOTE — Addendum Note (Signed)
Addended by: Nelson Chimes E on: 02/18/2018 10:58 AM   Modules accepted: Orders

## 2018-02-19 ENCOUNTER — Telehealth (HOSPITAL_COMMUNITY): Payer: Self-pay | Admitting: Physician Assistant

## 2018-02-19 ENCOUNTER — Ambulatory Visit (HOSPITAL_COMMUNITY): Payer: 59 | Attending: Sports Medicine | Admitting: Physical Therapy

## 2018-02-19 ENCOUNTER — Ambulatory Visit (HOSPITAL_COMMUNITY): Payer: 59 | Admitting: Physical Therapy

## 2018-02-19 DIAGNOSIS — G8929 Other chronic pain: Secondary | ICD-10-CM | POA: Diagnosis present

## 2018-02-19 DIAGNOSIS — M545 Low back pain, unspecified: Secondary | ICD-10-CM

## 2018-02-19 DIAGNOSIS — I890 Lymphedema, not elsewhere classified: Secondary | ICD-10-CM | POA: Insufficient documentation

## 2018-02-19 DIAGNOSIS — M6281 Muscle weakness (generalized): Secondary | ICD-10-CM | POA: Insufficient documentation

## 2018-02-19 NOTE — Therapy (Signed)
[facility] [place], [place], [address] Phone: [phone]   Fax:  [phone]  Physical Therapy Treatment  Patient Details  Name: Cynthia [name] MRN: [id number] Date of Birth: 04-29-1966 Referring Provider (PT): Aundria Mems   Encounter Date: 02/19/2018  PT End of Session - 02/19/18 1644    Visit Number  8    Number of Visits  18    Date for PT Re-Evaluation  02/25/18    PT Start Time  1600    PT Stop Time  1640    PT Time Calculation (min)  40 min    Activity Tolerance  Patient tolerated treatment well    Behavior During Therapy  [facility] for tasks assessed/performed       Past Medical History:  Diagnosis Date  . Chronic venous insufficiency 09/29/2017  . Deep venous thrombosis of lower extremity ([place]) 11/17/2015  . Depression   . History of DVT (deep vein thrombosis)   . Primary hyperparathyroidism ([place]) 11/28/2017  . Sleep difficulties     Past Surgical History:  Procedure Laterality Date  . COLONOSCOPY  2014  . GASTRIC RESTRICTION SURGERY     lap band  . HERNIA REPAIR      There were no vitals filed for this visit.  Subjective Assessment - 02/19/18 1557    Subjective  PT has not been able to come to treatment due to having car problems.      Pertinent History  DVT    Currently in Pain?  No/denies            LYMPHEDEMA/ONCOLOGY QUESTIONNAIRE - 02/19/18 1557      Type   Cancer Type  --   N/A     Date Lymphedema/Swelling Started   Date  10/19/17      Treatment   Active Chemotherapy Treatment  --   N/A     Lymphedema Stage   Stage  STAGE 2 SPONTANEOUSLY IRREVERSIBLE      Lymphedema Assessments   Lymphedema Assessments  Lower extremities      Right Lower Extremity Lymphedema   20 cm Proximal to Suprapatella  73.5 cm    10 cm Proximal to Suprapatella  57 cm    At Midpatella/Popliteal Crease  42 cm    30 cm Proximal to Floor at Lateral Plantar Foot  39.5 cm    20 cm Proximal  to Floor at Lateral Plantar Foot  31 1    10  cm Proximal to Floor at Lateral Malleoli  26.8 cm    Circumference of ankle/heel  35.6 cm.    5 cm Proximal to 1st MTP Joint  24.3 cm    Across MTP Joint  23.7 cm    Around Proximal Great Toe  8.8 cm      Left Lower Extremity Lymphedema   20 cm Proximal to Suprapatella  71 cm    10 cm Proximal to Suprapatella  62.5 cm    At Midpatella/Popliteal Crease  52.5 cm    30 cm Proximal to Floor at Lateral Plantar Foot  42.5 cm    20 cm Proximal to Floor at Lateral Plantar Foot  33.4 cm    10 cm Proximal to Floor at Lateral Malleoli  32.9 cm    Circumference of ankle/heel  36 cm.    5 cm Proximal to 1st MTP Joint  25.2 cm    Across MTP Joint  25 cm    Around Proximal Great Toe  8.4  cm                [facility] Adult PT Treatment/Exercise - 02/19/18 0001      Manual Therapy   Manual Therapy  Manual Lymphatic Drainage (MLD);Compression Bandaging    Manual therapy comments  supine     Manual Lymphatic Drainage (MLD)  completed for LT LE routing to Lt axillary, deep and superficial abdominal , inguinal axillary anastomisis followed by Lt LT; Short Rt LE completed as well     Compression Bandaging  for entire  Lt LE without toe bandaging using multilayer compression bandages and 1/2" foam               PT Short Term Goals - 02/10/18 1615      PT SHORT TERM GOAL #1   Title  PT to be completing LE exercise to promote lymphatic circulation.    Time  1    Period  Weeks    Status  Achieved      PT SHORT TERM GOAL #2   Title  Pt to be able to verbalize that lymphedema is a chronic condition and must be maintained by wearing compression garments on a daily basis as well as using a compression pump.     Time  2    Period  Weeks    Status  Achieved      PT SHORT TERM GOAL #3   Title  PT to have lost on an average 2 cm of fluid per measurment in Lt LE to decrease risk of cellulitis     Time  3    Period  Weeks    Status  Achieved         PT Long Term Goals - 01/31/18 1204      PT LONG TERM GOAL #1   Title  PT to have lost an average of 3-4 cm of volume in LT LE to allow pt to ambulate with no difficulties     Time  6    Period  Weeks    Status  On-going      PT LONG TERM GOAL #2   Title  PT to be able to don and doff her shoes and pants with greater ease.     Time  6    Period  Weeks    Status  On-going      PT LONG TERM GOAL #3   Title  PT to have acquired both compression garment and compression pump and to be using both   ORder faxed to MD today for both.    Time  6    Period  Weeks    Status  On-going            Plan - 02/19/18 1644    Clinical Impression Statement  PT has not been to treatment since 02/10/2018 do to car problems.  She states that she has been trying to wrap herself the best she could. PT remeasued with expected increase of volume compared to 02/10/2018.  PT will be able to come on a regular basis at this time .  Due to time restraints pt manual was completed in supine only.     Rehab Potential  Good    PT Frequency  3x / week    PT Duration  6 weeks    PT Treatment/Interventions  Manual lymph drainage;Compression bandaging;Therapeutic exercise;ADLs/Self Care Home Management    PT Next Visit Plan  Continue with  total decongestive techniques followed  by compression bandaging of LT LE.   Measure on weekly.  Review self massaging techniques.     PT Home Exercise Plan  to improve LE lymphatic circulation.     Consulted and Agree with Plan of Care  Patient       Patient will benefit from skilled therapeutic intervention in order to improve the following deficits and impairments:  Decreased activity tolerance, Difficulty walking, Increased edema, Pain  Visit Diagnosis: Lymphedema, not elsewhere classified  Chronic bilateral low back pain without sciatica  Muscle weakness (generalized)     Problem List Patient Active Problem List   Diagnosis Date Noted  . Primary  hyperparathyroidism ([place]) 11/28/2017  . Microcytic anemia 11/13/2017  . Low mean corpuscular volume (MCV) 11/13/2017  . Hypercalcemia 11/13/2017  . Sacroiliac joint dysfunction of both sides 10/21/2017  . Lumbar degenerative disc disease 10/21/2017  . Abnormal weight gain 10/21/2017  . Lymphedema 09/29/2017  . Primary osteoarthritis of left knee 09/19/2017  . Peripheral edema 09/19/2017  . Morbid obesity with BMI of 40.0-44.9, adult ([place]) 09/18/2017  . Urinary incontinence 09/18/2017  . History of DVT (deep vein thrombosis)   . Lesion of breast 06/18/2017  . LGSIL on Pap smear of cervix 11/22/2015  . Migraine 11/17/2015  . Vitamin D deficiency 11/17/2015  . Constipation-normal barium enema Sept 2013 02/06/2012  . Lapband APL + HH repair (1 suture) 01/28/2012    Rayetta Humphrey, PT CLT [phone] 02/19/2018, 4:48 PM  [facility] [address] [place], [place], [address] Phone: [phone]   Fax:  [phone]  Name: Cynthia [name] MRN: [id number] Date of Birth: 11-May-1966

## 2018-02-19 NOTE — Telephone Encounter (Signed)
02/19/18  left a message to cx today's appt due to therapist out sick

## 2018-02-21 ENCOUNTER — Ambulatory Visit (HOSPITAL_COMMUNITY): Payer: 59 | Admitting: Physical Therapy

## 2018-02-21 ENCOUNTER — Encounter (HOSPITAL_COMMUNITY): Payer: Self-pay | Admitting: Physical Therapy

## 2018-02-21 DIAGNOSIS — I890 Lymphedema, not elsewhere classified: Secondary | ICD-10-CM

## 2018-02-21 DIAGNOSIS — G8929 Other chronic pain: Secondary | ICD-10-CM | POA: Diagnosis not present

## 2018-02-21 DIAGNOSIS — M545 Low back pain: Secondary | ICD-10-CM | POA: Diagnosis not present

## 2018-02-21 DIAGNOSIS — M6281 Muscle weakness (generalized): Secondary | ICD-10-CM | POA: Diagnosis not present

## 2018-02-21 NOTE — Therapy (Signed)
[facility] [place], [place], [address] Phone: [phone]   Fax:  [phone]  Physical Therapy Treatment  Patient Details  Name: Cynthia [name] MRN: [id number] Date of Birth: Dec 31, 1965 Referring Provider (PT): Aundria Mems   Encounter Date: 02/21/2018  PT End of Session - 02/21/18 1431    Visit Number  9    Number of Visits  18    Date for PT Re-Evaluation  02/25/18    PT Start Time  1330    PT Stop Time  1425    PT Time Calculation (min)  55 min    Activity Tolerance  Patient tolerated treatment well    Behavior During Therapy  [facility] for tasks assessed/performed       Past Medical History:  Diagnosis Date  . Chronic venous insufficiency 09/29/2017  . Deep venous thrombosis of lower extremity ([place]) 11/17/2015  . Depression   . History of DVT (deep vein thrombosis)   . Primary hyperparathyroidism ([place]) 11/28/2017  . Sleep difficulties     Past Surgical History:  Procedure Laterality Date  . COLONOSCOPY  2014  . GASTRIC RESTRICTION SURGERY     lap band  . HERNIA REPAIR      There were no vitals filed for this visit.  Subjective Assessment - 02/21/18 1429    Subjective  PT states that she is going to call about the pump as they stated she would have it last week and it still has not arrieved.  Pt state that she and her husband practiced the  self manual and it went pretty good.     Pertinent History  DVT    Currently in Pain?  No/denies             [facility] Adult PT Treatment/Exercise - 02/21/18 0001      Manual Therapy   Manual Therapy  Manual Lymphatic Drainage (MLD);Compression Bandaging    Manual therapy comments  supine and prone    Manual Lymphatic Drainage (MLD)  completed for LT LE routing to Lt axillary, deep and superficial abdominal , inguinal axillary anastomisis followed by Lt LT; Short Rt LE completed as well     Compression Bandaging  for entire  Lt LE without toe bandaging  using multilayer compression bandages and 1/2" foam               PT Short Term Goals - 02/10/18 1615      PT SHORT TERM GOAL #1   Title  PT to be completing LE exercise to promote lymphatic circulation.    Time  1    Period  Weeks    Status  Achieved      PT SHORT TERM GOAL #2   Title  Pt to be able to verbalize that lymphedema is a chronic condition and must be maintained by wearing compression garments on a daily basis as well as using a compression pump.     Time  2    Period  Weeks    Status  Achieved      PT SHORT TERM GOAL #3   Title  PT to have lost on an average 2 cm of fluid per measurment in Lt LE to decrease risk of cellulitis     Time  3    Period  Weeks    Status  Achieved        PT Long Term Goals - 01/31/18 1204      PT LONG TERM  GOAL #1   Title  PT to have lost an average of 3-4 cm of volume in LT LE to allow pt to ambulate with no difficulties     Time  6    Period  Weeks    Status  On-going      PT LONG TERM GOAL #2   Title  PT to be able to don and doff her shoes and pants with greater ease.     Time  6    Period  Weeks    Status  On-going      PT LONG TERM GOAL #3   Title  PT to have acquired both compression garment and compression pump and to be using both   ORder faxed to MD today for both.    Time  6    Period  Weeks    Status  On-going            Plan - 02/21/18 1432    Clinical Impression Statement  Pt not as indurated today.  Emphasized to pt that the goal is to keep bandages on as long as possible and if the thigh bandages fall off but the LE stay up she does not need to remove the Lower leg.      Rehab Potential  Good    PT Frequency  3x / week    PT Duration  6 weeks    PT Treatment/Interventions  Manual lymph drainage;Compression bandaging;Therapeutic exercise;ADLs/Self Care Home Management    PT Next Visit Plan  Continue with  total decongestive techniques followed by compression bandaging of LT LE.   Measure on  next visit for 10th visit progress note with comparison to first day pt came to treatment.     PT Home Exercise Plan  to improve LE lymphatic circulation.     Consulted and Agree with Plan of Care  Patient       Patient will benefit from skilled therapeutic intervention in order to improve the following deficits and impairments:  Decreased activity tolerance, Difficulty walking, Increased edema, Pain  Visit Diagnosis: Lymphedema, not elsewhere classified     Problem List Patient Active Problem List   Diagnosis Date Noted  . Primary hyperparathyroidism ([place]) 11/28/2017  . Microcytic anemia 11/13/2017  . Low mean corpuscular volume (MCV) 11/13/2017  . Hypercalcemia 11/13/2017  . Sacroiliac joint dysfunction of both sides 10/21/2017  . Lumbar degenerative disc disease 10/21/2017  . Abnormal weight gain 10/21/2017  . Lymphedema 09/29/2017  . Primary osteoarthritis of left knee 09/19/2017  . Peripheral edema 09/19/2017  . Morbid obesity with BMI of 40.0-44.9, adult ([place]) 09/18/2017  . Urinary incontinence 09/18/2017  . History of DVT (deep vein thrombosis)   . Lesion of breast 06/18/2017  . LGSIL on Pap smear of cervix 11/22/2015  . Migraine 11/17/2015  . Vitamin D deficiency 11/17/2015  . Constipation-normal barium enema Sept 2013 02/06/2012  . Lapband APL + HH repair (1 suture) 01/28/2012   Rayetta Humphrey, PT CLT [phone] 02/21/2018, 2:34 PM  [facility] [address] [place], [place], [address] Phone: [phone]   Fax:  [phone]  Name: Cynthia [name] MRN: [id number] Date of Birth: 09-19-1965

## 2018-02-26 ENCOUNTER — Encounter: Payer: Self-pay | Admitting: Internal Medicine

## 2018-02-26 ENCOUNTER — Ambulatory Visit (INDEPENDENT_AMBULATORY_CARE_PROVIDER_SITE_OTHER): Payer: 59 | Admitting: Internal Medicine

## 2018-02-26 VITALS — BP 108/76 | HR 67 | Ht 66.0 in | Wt 279.0 lb

## 2018-02-26 DIAGNOSIS — E559 Vitamin D deficiency, unspecified: Secondary | ICD-10-CM | POA: Diagnosis not present

## 2018-02-26 DIAGNOSIS — E213 Hyperparathyroidism, unspecified: Secondary | ICD-10-CM | POA: Diagnosis not present

## 2018-02-26 LAB — VITAMIN D 25 HYDROXY (VIT D DEFICIENCY, FRACTURES): VITD: 17.47 ng/mL — ABNORMAL LOW (ref 30.00–100.00)

## 2018-02-26 LAB — TSH: TSH: 1.9 u[IU]/mL (ref 0.35–4.50)

## 2018-02-26 NOTE — Progress Notes (Signed)
Patient ID: Cynthia [name], female   DOB: 07-21-1965, 52 y.o.   MRN: [id number]    HPI  Cynthia [name] is a 52 y.o.-year-old female, referred by her PCP, Cynthia Dredge, PA-C , for evaluation and management of hypercalcemia/hyperparathyroidism.  Pt was dx with hypercalcemia in 2012 and she was found to have a very high PTH in 11/2017. I reviewed pt's pertinent labs: Lab Results  Component Value Date   PTH 316 (H) 11/27/2017   CALCIUM 11.6 (H) 11/27/2017   CALCIUM 10.9 (H) 11/11/2017   CALCIUM 10.7 (H) 10/26/2010   CALCIUM 10.6 (H) 10/19/2010   CALCIUM 10.5 01/27/2009   No fractures or falls. No available DXA scans for review but no formal history of osteoporosis.  + h/o kidney stones - 1 episode in 2018.  No h/o CKD. Last BUN/Cr: Lab Results  Component Value Date   BUN 17 11/11/2017   BUN 5 (L) 10/26/2010   CREATININE 0.85 11/11/2017   CREATININE 0.62 10/26/2010   Pt is not on HCTZ.  + h/o vitamin D deficiency, however, available Vit D levels: No results found for: VD25OH  Pt is on vitamin D in [facility] (1000 units daily).  She is also taking a multivitamin daily.  She is on omeprazole.  Pt does not have a FH of hypercalcemia, pituitary tumors, ?thyroid cancer, MEN sd's, or osteoporosis. Kidney stones in both parents.   Pt. also has a history of LE lymphedema L>R.  She started to go to lymphedema clinic in [place].  The clinic in [place] is only caring for cancer patients.  Pt has a h/o lap band 2010 >> initially lost approximately 100 pounds, then gained 30 back after she got married.  No history of thyrotoxicosis. No results found for: TSH  ROS: Constitutional: + weight gain, + fatigue, + both subjective hyperthermia/hypothermia, + hot flashes, + nocturia Eyes: + Blurry vision, no xerophthalmia ENT: no sore throat, no nodules palpated in throat, no dysphagia/odynophagia, no hoarseness, + hypoacusis Cardiovascular: no CP/SOB/palpitations/+  leg swelling Respiratory: no cough/SOB Gastrointestinal: no N/V/D/ + C Musculoskeletal: + Muscle/+ joint aches Skin: no rashes, + excessive hair loss, + easy bruising Neurological: no tremors/numbness/tingling/dizziness, + headache Psychiatric: + Depression/no anxiety She is currently going through menopause.  Past Medical History:  Diagnosis Date  . Chronic venous insufficiency 09/29/2017  . Deep venous thrombosis of lower extremity ([place]) 11/17/2015  . Depression   . History of DVT (deep vein thrombosis)   . Primary hyperparathyroidism ([place]) 11/28/2017  . Sleep difficulties    Past Surgical History:  Procedure Laterality Date  . COLONOSCOPY  2014  . GASTRIC RESTRICTION SURGERY     lap band  . HERNIA REPAIR     Social History   Socioeconomic History  . Marital status: Married    Spouse name: Not on file  . Number of children: 2  . Years of education: Not on file  . Highest education level: Not on file  Occupational History  .  Radiology tech  Tobacco Use  . Smoking status: Never Smoker  . Smokeless tobacco: Never Used  Substance and Sexual Activity  . Alcohol use:  Yes, socially, 4-5 times yearly, liquor/wine  . Drug use: No  . Sexual activity: Yes    Birth control/protection: Post-menopausal   Current Outpatient Medications on File Prior to Visit  Medication Sig Dispense Refill  . AMBULATORY NON FORMULARY MEDICATION Knee-high, medium compression, graduated compression stockings. Apply to bilateral lower extremities daily 1 each 0  .  aspirin EC 81 MG tablet Take 81 mg by mouth daily.    Marland Kitchen buPROPion (WELLBUTRIN XL) 150 MG 24 hr tablet Take 1 tablet (150 mg total) by mouth daily. 90 tablet 0  . cyclobenzaprine (FLEXERIL) 10 MG tablet Take 1 tablet (10 mg total) by mouth at bedtime as needed for muscle spasms. 30 tablet 0  . Elastic Bandages & Supports (T.E.D. BELOW KNEE/X-LARGE) MISC Apply to lower extremities daily. Remove at bedtime 2 each 0  . furosemide (LASIX) 40  MG tablet Take 1 tablet (40 mg total) by mouth daily. 30 tablet 3  . ibuprofen (ADVIL,MOTRIN) 800 MG tablet Take 1 tablet (800 mg total) by mouth every 8 (eight) hours as needed for moderate pain. 60 tablet 0  . linaclotide (LINZESS) 290 MCG CAPS capsule Take 290 mcg by mouth daily before breakfast.    . Multiple Vitamins-Minerals (MULTIVITAMIN WITH MINERALS) tablet Take 1 tablet by mouth daily.    . NONFORMULARY OR COMPOUNDED ITEM Thigh-high medium compression stockings. Wear daily. Remove at bed time 2 each 0  . NONFORMULARY OR COMPOUNDED ITEM Knee high medium compression stockings. Wear daily. Remove at bedtime 2 each 0  . omeprazole (PRILOSEC) 20 MG capsule Take 1 capsule (20 mg total) by mouth daily. For GI prophylaxis while taking NSAIDs 30 capsule 11  . vitamin B-12 (CYANOCOBALAMIN) 1000 MCG tablet Take 1,000 mcg by mouth daily.    Marland Kitchen zolpidem (AMBIEN) 5 MG tablet Take 5 mg by mouth at bedtime as needed. For sleep      No current facility-administered medications on file prior to visit.    Allergies  Allergen Reactions  . Penicillin G Itching  . Latex Rash    Where touched and will spread.   Family History  Problem Relation Age of Onset  . Stroke Mother   . Hypertension Mother   . Heart disease Father   . Heart attack Father   . Alcohol abuse Father   . Prostate cancer Father   . Uterine cancer Maternal Grandmother     PE: BP 108/76   Pulse 67   Ht 5\' 6"  (1.676 m)   Wt 279 lb (126.6 kg)   LMP 04/24/2014   SpO2 98%   BMI 45.03 kg/m  Wt Readings from Last 3 Encounters:  01/23/18 278 lb (126.1 kg)  12/02/17 276 lb (125.2 kg)  11/04/17 280 lb (127 kg)   Constitutional: Obese, in NAD. No kyphosis. Eyes: PERRLA, EOMI, no exophthalmos ENT: moist mucous membranes, no thyromegaly, no cervical lymphadenopathy Cardiovascular: RRR, No MRG, + L > RLE edema - wears compression hoses Respiratory: CTA B Gastrointestinal: abdomen soft, NT, ND, BS+ Musculoskeletal: no  deformities, strength intact in all 4 Skin: moist, warm, no rashes Neurological: no tremor with outstretched hands, DTR normal in all 4  Assessment: 1. Hypercalcemia/hyperparathyroidism  [name]: Patient has had several instances of elevated calcium, with the highest level being at 11.6% A corresponding intact PTH level was also high, at 316.  - It is unclear whether patient has vitamin D deficiency - She had at least 1 complication from hypercalcemia: + h/o nephrolithiasis, but no osteoporosis, no fractures. No abdominal pain, depression, bone pain.  She does have chronic constipation. - I discussed with the patient about the physiology of calcium and parathyroid hormone, and possible side effects from increased PTH, including kidney stones, osteoporosis, abdominal pain, etc.  - We discussed that we need to check whether her hyperparathyroidism is primary (Familial hypercalcemic hypocalciuria or parathyroid adenoma) or secondary (  to conditions like: vitamin D deficiency, calcium malabsorption, hypercalciuria, renal insufficiency, etc.). - I discussed with her that we first need to bring her vitamin D level to normal so we can further investigate the parathyroid status. I explained that in the setting of a low vitamin D, the parathyroid hormone can be elevated, which is not a pathologic finding. However, if the PTH is elevated in the setting of a normal vitamin D, we will further need to investigate her for primary or secondary hyperparathyroidism. - after we normalize the vitamin D level, we'll need to check: calcium level intact PTH ([facility]) Magnesium Phosphorus vitamin D- 25 HO and 1,25 HO 24h urinary calcium/creatinine ratio - given instructions for urine collection - We discussed possible consequences of hyperparathyroidism: ~1/3 pts will develop complications over 15 years (OP, nephrolithiasis).  - If the tests indicate a parathyroid adenoma, she agrees with a referral to surgery.  -  Criteria for parathyroid surgery are (she meets the 2 in bold):  Marland Kitchen Increased calcium by more than 1 mg/dL above the upper limit of normal  . Kidney ds.  . Osteoporosis (or vertebral fracture) . Age <32 years old Newer criteria (2013): Marland Kitchen High UCa >400 mg/d and increased stone risk by biochemical stone risk analysis . Presence of nephrolithiasis or nephrocalcinosis . Pt's preference!  - We may need to check a DXA scan to see if she has osteoporosis (+ add a 33% distal radius for evaluation of cortical bone, which is predominantly affected by hyperparathyroidism).  - I will advise her about vitamin D supplement dose when the results of the vitamin D level are back. - I will see the patient back in 4 months  Component     Latest Ref Rng & Units 02/26/2018  VITD     30.00 - 100.00 ng/mL 17.47 (L)  TSH     0.35 - 4.50 uIU/mL 1.90   Vitamin D is quite low.  I will advise her to start 3000 units vitamin D daily (we will not start more due to concern for hypercalcemia).  We will have her back for another vitamin D level in 8 weeks.  Philemon Kingdom, MD PhD [facility] Endocrinology

## 2018-02-26 NOTE — Patient Instructions (Addendum)
Please stop at the lab.  Please stop the multivitamins.  Please come back for a follow-up appointment in 4 months.  Hypercalcemia Hypercalcemia is having too much calcium in the blood. The body needs calcium to make bones and keep them strong. Calcium also helps the muscles, nerves, brain, and heart work the way they should. Most of the calcium in the body is in the bones. There is also some calcium in the blood. Hypercalcemia can happen when calcium comes out of the bones, or when the kidneys are not able to remove calcium from the blood. Hypercalcemia can be mild or severe. What are the causes? There are many possible causes of hypercalcemia. Common causes include:  Hyperparathyroidism. This is a condition in which the body produces too much parathyroid hormone. There are four parathyroid glands in your neck. These glands produce a chemical messenger (hormone) that helps the body absorb calcium from foods and helps your bones release calcium.  Certain kinds of cancer, such as lung cancer, breast cancer, or myeloma.  Less common causes of hypercalcemia include:  Getting too much calcium or vitamin D from your diet.  Kidney failure.  Hyperthyroidism.  Being on bed rest for a long time.  Certain medicines.  Infections.  Sarcoidosis.  What increases the risk? This condition is more likely to develop in:  Women.  People who are 60 years or older.  People who have a family history of hypercalcemia.  What are the signs or symptoms? Mild hypercalcemia that starts slowly may not cause symptoms. Severe, sudden hypercalcemia is more likely to cause symptoms, such as:  Loss of appetite.  Increased thirst and frequent urination.  Fatigue.  Nausea and vomiting.  Headache.  Abdominal pain.  Muscle pain, twitching, or weakness.  Constipation.  Blood in the urine.  Pain in the side of the back (flank pain).  Anxiety, confusion, or depression.  Irregular heartbeat  (arrhythmia).  Loss of consciousness.  How is this diagnosed? This condition may be diagnosed based on:  Your symptoms.  Blood tests.  Urine tests.  X-rays.  Ultrasound.  MRI.  CT scan.  How is this treated? Treatment for hypercalcemia depends on the cause. Treatment may include:  Receiving fluids through an IV tube.  Medicines that keep calcium levels steady after receiving fluids (loop diuretics).  Medicines that keep calcium in your bones (bisphosphonates).  Medicines that lower the calcium level in your blood.  Surgery to remove overactive parathyroid glands.  Follow these instructions at home:  Take over-the-counter and prescription medicines only as told by your health care provider.  Follow instructions from your health care provider about eating or drinking restrictions.  Drink enough fluid to keep your urine clear or pale yellow.  Stay active. Weight-bearing exercise helps to keep calcium in your bones. Follow instructions from your health care provider about what type and level of exercise is safe for you.  Keep all follow-up visits as told by your health care provider. This is important. Contact a health care provider if:  You have a fever.  You have flank or abdominal pain that is getting worse. Get help right away if:  You have severe abdominal or flank pain.  You have chest pain.  You have trouble breathing.  You become very confused and sleepy.  You lose consciousness. This information is not intended to replace advice given to you by your health care provider. Make sure you discuss any questions you have with your health care provider. Document Released: 07/21/2004  Document Revised: 10/13/2015 Document Reviewed: 09/22/2014 Elsevier Interactive Patient Education  [employer].

## 2018-03-03 ENCOUNTER — Ambulatory Visit (HOSPITAL_COMMUNITY): Payer: 59 | Admitting: Physical Therapy

## 2018-03-03 ENCOUNTER — Encounter (HOSPITAL_COMMUNITY): Payer: Self-pay | Admitting: Physical Therapy

## 2018-03-03 DIAGNOSIS — G8929 Other chronic pain: Secondary | ICD-10-CM | POA: Diagnosis not present

## 2018-03-03 DIAGNOSIS — M6281 Muscle weakness (generalized): Secondary | ICD-10-CM | POA: Diagnosis not present

## 2018-03-03 DIAGNOSIS — M545 Low back pain: Secondary | ICD-10-CM | POA: Diagnosis not present

## 2018-03-03 DIAGNOSIS — I890 Lymphedema, not elsewhere classified: Secondary | ICD-10-CM | POA: Diagnosis not present

## 2018-03-03 NOTE — Therapy (Signed)
[facility] [address] [place], [place], [address] Phone: [phone]   Fax:  [phone]  Physical Therapy Treatment  Patient Details  Name: Cynthia [name] MRN: [id number] Date of Birth: [birth year] Referring Provider (PT): Aundria Mems   Encounter Date: 03/03/2018 Progress Note Reporting Period 01/14/2018 to 03/03/2018  See note below for Objective Data and Assessment of Progress/Goals.        PT End of Session - 03/03/18 1108    Visit Number  10    Number of Visits  22    Date for PT Re-Evaluation  04/02/18    PT Start Time  0945    PT Stop Time  1100    PT Time Calculation (min)  75 min    Activity Tolerance  Patient tolerated treatment well    Behavior During Therapy  WFL for tasks assessed/performed       Past Medical History:  Diagnosis Date  . Chronic venous insufficiency 09/29/2017  . Deep venous thrombosis of lower extremity ([place]) 11/17/2015  . Depression   . History of DVT (deep vein thrombosis)   . Primary hyperparathyroidism ([place]) 11/28/2017  . Sleep difficulties     Past Surgical History:  Procedure Laterality Date  . COLONOSCOPY  2014  . GASTRIC RESTRICTION SURGERY     lap band  . HERNIA REPAIR      There were no vitals filed for this visit.  Subjective Assessment - 03/03/18 0948    Subjective  Pt has not been to therapy since 02/21/2018; states that she has been working 11 hr shifts and has had some issues at home.      Pertinent History  DVT    Currently in Pain?  No/denies            LYMPHEDEMA/ONCOLOGY QUESTIONNAIRE - 03/03/18 0948      Right Lower Extremity Lymphedema   20 cm Proximal to Suprapatella  70 cm    10 cm Proximal to Suprapatella  59 cm    At Midpatella/Popliteal Crease  45.3 cm    30 cm Proximal to Floor at Lateral Plantar Foot  38.5 cm    20 cm Proximal to Floor at Lateral Plantar Foot  31.8 1    10  cm Proximal to Floor at Lateral Malleoli  27 cm    Circumference of ankle/heel  34.2 cm.    5 cm Proximal to 1st MTP Joint  24.5 cm    Across MTP Joint  23 cm    Around Proximal Great Toe  7 cm      Left Lower Extremity Lymphedema   20 cm Proximal to Suprapatella  69.4 cm   initially 72.5   10 cm Proximal to Suprapatella  60 cm   58.5   At Midpatella/Popliteal Crease  47.9 cm   45   30 cm Proximal to Floor at Lateral Plantar Foot  39.8 cm   41.5   20 cm Proximal to Floor at Lateral Plantar Foot  36 cm   34.8   10 cm Proximal to Floor at Lateral Malleoli  33.8 cm   35.5   Circumference of ankle/heel  34.6 cm.   37.4   5 cm Proximal to 1st MTP Joint  26 cm   26.7   Across MTP Joint  24 cm   24.2   Around Proximal Great Toe  8.2 cm   9.4  PT Short Term Goals - 03/03/18 1115      PT SHORT TERM GOAL #1   Title  PT to be completing LE exercise to promote lymphatic circulation.    Time  1    Period  Weeks    Status  Achieved      PT SHORT TERM GOAL #2   Title  Pt to be able to verbalize that lymphedema is a chronic condition and must be maintained by wearing compression garments on a daily basis as well as using a compression pump.     Time  2    Period  Weeks    Status  Achieved      PT SHORT TERM GOAL #3   Title  PT to have lost on an average 2 cm of fluid per measurment in Lt LE to decrease risk of cellulitis     Time  3    Period  Weeks    Status  On-going        PT Long Term Goals - 01/31/18 1204      PT LONG TERM GOAL #1   Title  PT to have lost an average of 3-4 cm of volume in LT LE to allow pt to ambulate with no difficulties     Time  6    Period  Weeks    Status  On-going      PT LONG TERM GOAL #2   Title  PT to be able to don and doff her shoes and pants with greater ease.     Time  6    Period  Weeks    Status  On-going      PT LONG TERM GOAL #3   Title  PT to have acquired both compression garment and compression pump and to be using both   ORder  faxed to MD today for both.    Time  6    Period  Weeks    Status  On-going            Plan - 03/03/18 1109    Clinical Impression Statement  Pt remeasured and compared volume to initial evaluation with mixed results.  Pt has not been to treatment in 10 days.  Therapist stressed the importance of coming to therapy everyday if she wants to make steady progress.  Pt vocalized understanding.  We will continue to see pt for total decongestive techniques to her LT LE for four more weeks to see if we treat on a consistent bassis if we can decrease edema .    Rehab Potential  Good    PT Frequency  3x / week    PT Duration  Other (comment)   total of 10 weeks 4 more weeks.  Pt has not been seen for the past 10 days.    PT Treatment/Interventions  Manual lymph drainage;Compression bandaging;Therapeutic exercise;ADLs/Self Care Home Management    PT Next Visit Plan  Continue with  total decongestive techniques followed by compression bandaging of LT LE.   Follow up on pump     PT Home Exercise Plan  to improve LE lymphatic circulation.     Consulted and Agree with Plan of Care  Patient       Patient will benefit from skilled therapeutic intervention in order to improve the following deficits and impairments:  Decreased activity tolerance, Difficulty walking, Increased edema, Pain  Visit Diagnosis: Lymphedema, not elsewhere classified     Problem List Patient Active Problem List  Diagnosis Date Noted  . Hyperparathyroidism ([place]) 11/28/2017  . Microcytic anemia 11/13/2017  . Low mean corpuscular volume (MCV) 11/13/2017  . Hypercalcemia 11/13/2017  . Sacroiliac joint dysfunction of both sides 10/21/2017  . Lumbar degenerative disc disease 10/21/2017  . Abnormal weight gain 10/21/2017  . Lymphedema 09/29/2017  . Primary osteoarthritis of left knee 09/19/2017  . Peripheral edema 09/19/2017  . Morbid obesity with BMI of 40.0-44.9, adult ([place]) 09/18/2017  . Urinary incontinence  09/18/2017  . History of DVT (deep vein thrombosis)   . Lesion of breast 06/18/2017  . LGSIL on Pap smear of cervix 11/22/2015  . Migraine 11/17/2015  . Vitamin D deficiency 11/17/2015  . Constipation-normal barium enema Sept 2013 02/06/2012  . Lapband APL + HH repair (1 suture) 01/28/2012   Rayetta Humphrey, PT CLT [phone] 03/03/2018, 11:16 AM  [facility] [place], [place], [address] Phone: [phone]   Fax:  [phone]  Name: Cynthia [name] MRN: [id number] Date of Birth: 10-Nov-1965

## 2018-03-05 ENCOUNTER — Ambulatory Visit (HOSPITAL_COMMUNITY): Payer: 59 | Admitting: Physical Therapy

## 2018-03-05 ENCOUNTER — Telehealth (HOSPITAL_COMMUNITY): Payer: Self-pay | Admitting: Physician Assistant

## 2018-03-05 NOTE — Telephone Encounter (Signed)
03/05/18  pt left a message to cx both of these appts... no reason was given

## 2018-03-07 ENCOUNTER — Encounter (HOSPITAL_COMMUNITY): Payer: 59 | Admitting: Physical Therapy

## 2018-03-10 ENCOUNTER — Ambulatory Visit (HOSPITAL_COMMUNITY): Payer: 59 | Admitting: Physical Therapy

## 2018-03-10 DIAGNOSIS — G8929 Other chronic pain: Secondary | ICD-10-CM | POA: Diagnosis not present

## 2018-03-10 DIAGNOSIS — M545 Low back pain: Secondary | ICD-10-CM | POA: Diagnosis not present

## 2018-03-10 DIAGNOSIS — I890 Lymphedema, not elsewhere classified: Secondary | ICD-10-CM

## 2018-03-10 DIAGNOSIS — M6281 Muscle weakness (generalized): Secondary | ICD-10-CM | POA: Diagnosis not present

## 2018-03-10 NOTE — Therapy (Signed)
[place], [place], [address] Phone: [phone]   Fax:  [phone]  Physical Therapy Treatment  Patient Details  Name: Cynthia [name] MRN: [id number] Date of Birth: 27-Dec-1965 Referring Provider (PT): Aundria Mems   Encounter Date: 03/10/2018  PT End of Session - 03/10/18 1045    Visit Number  11    Number of Visits  22    Date for PT Re-Evaluation  04/02/18    PT Start Time  0908    PT Stop Time  1010    PT Time Calculation (min)  62 min    Activity Tolerance  Patient tolerated treatment well    Behavior During Therapy  [facility] for tasks assessed/performed       Past Medical History:  Diagnosis Date  . Chronic venous insufficiency 09/29/2017  . Deep venous thrombosis of lower extremity ([place]) 11/17/2015  . Depression   . History of DVT (deep vein thrombosis)   . Primary hyperparathyroidism ([place]) 11/28/2017  . Sleep difficulties     Past Surgical History:  Procedure Laterality Date  . COLONOSCOPY  2014  . GASTRIC RESTRICTION SURGERY     lap band  . HERNIA REPAIR      There were no vitals filed for this visit.  Subjective Assessment - 03/10/18 1038    Subjective  PT states her sister decided to get married last week so that is why she had to cancel all her appointments.  States she took her pump with her to [place] and also self bandaged. Pt requests not to have her thighs bandaged.              LYMPHEDEMA/ONCOLOGY QUESTIONNAIRE - 03/10/18 1043      Right Lower Extremity Lymphedema   20 cm Proximal to Suprapatella  70 cm    10 cm Proximal to Suprapatella  59 cm    At Midpatella/Popliteal Crease  45.3 cm    30 cm Proximal to Floor at Lateral Plantar Foot  38.5 cm    20 cm Proximal to Floor at Lateral Plantar Foot  31.8 1    10  cm Proximal to Floor at Lateral Malleoli  27 cm    Circumference of ankle/heel  34.2 cm.    5 cm Proximal to 1st MTP Joint  24.5 cm    Across MTP Joint   23 cm    Around Proximal Great Toe  7 cm      Left Lower Extremity Lymphedema   20 cm Proximal to Suprapatella  70 cm   initially 72.5   10 cm Proximal to Suprapatella  65 cm   58.5   At Midpatella/Popliteal Crease  47 cm   45   30 cm Proximal to Floor at Lateral Plantar Foot  42 cm   41.5   20 cm Proximal to Floor at Lateral Plantar Foot  33 cm   34.8   10 cm Proximal to Floor at Lateral Malleoli  32.2 cm   35.5   Circumference of ankle/heel  34.9 cm.   37.4   5 cm Proximal to 1st MTP Joint  25.5 cm   26.7   Across MTP Joint  25.2 cm   24.2   Around Proximal Great Toe  8.6 cm   9.4                         PT Short Term Goals - 03/03/18 1115  PT SHORT TERM GOAL #1   Title  PT to be completing LE exercise to promote lymphatic circulation.    Time  1    Period  Weeks    Status  Achieved      PT SHORT TERM GOAL #2   Title  Pt to be able to verbalize that lymphedema is a chronic condition and must be maintained by wearing compression garments on a daily basis as well as using a compression pump.     Time  2    Period  Weeks    Status  Achieved      PT SHORT TERM GOAL #3   Title  PT to have lost on an average 2 cm of fluid per measurment in Lt LE to decrease risk of cellulitis     Time  3    Period  Weeks    Status  On-going        PT Long Term Goals - 01/31/18 1204      PT LONG TERM GOAL #1   Title  PT to have lost an average of 3-4 cm of volume in LT LE to allow pt to ambulate with no difficulties     Time  6    Period  Weeks    Status  On-going      PT LONG TERM GOAL #2   Title  PT to be able to don and doff her shoes and pants with greater ease.     Time  6    Period  Weeks    Status  On-going      PT LONG TERM GOAL #3   Title  PT to have acquired both compression garment and compression pump and to be using both   ORder faxed to MD today for both.    Time  6    Period  Weeks    Status  On-going            Plan -  03/10/18 1048    Clinical Impression Statement  pt returns today with missed sessions last week.  Remeasured with mixed results.  Overall increase in thigh, reduction in distal LE.  Explained how wrapping thigh benefits flow and explained increase in fluid there today/benefits of wrapping thigh.  Pt still declined.  Completed decongestive techniques completed today with knee high bandaging on Lt LE.      Rehab Potential  Good    PT Frequency  3x / week    PT Duration  Other (comment)   total of 10 weeks 4 more weeks.  Pt has not been seen for the past 10 days.    PT Treatment/Interventions  Manual lymph drainage;Compression bandaging;Therapeutic exercise;ADLs/Self Care Home Management    PT Next Visit Plan  Continue with  total decongestive techniques followed by compression bandaging of LT LE.      PT Home Exercise Plan  to improve LE lymphatic circulation.     Consulted and Agree with Plan of Care  Patient       Patient will benefit from skilled therapeutic intervention in order to improve the following deficits and impairments:  Decreased activity tolerance, Difficulty walking, Increased edema, Pain  Visit Diagnosis: Lymphedema, not elsewhere classified     Problem List Patient Active Problem List   Diagnosis Date Noted  . Hyperparathyroidism ([place]) 11/28/2017  . Microcytic anemia 11/13/2017  . Low mean corpuscular volume (MCV) 11/13/2017  . Hypercalcemia 11/13/2017  . Sacroiliac joint dysfunction of both sides 10/21/2017  .  Lumbar degenerative disc disease 10/21/2017  . Abnormal weight gain 10/21/2017  . Lymphedema 09/29/2017  . Primary osteoarthritis of left knee 09/19/2017  . Peripheral edema 09/19/2017  . Morbid obesity with BMI of 40.0-44.9, adult ([place]) 09/18/2017  . Urinary incontinence 09/18/2017  . History of DVT (deep vein thrombosis)   . Lesion of breast 06/18/2017  . LGSIL on Pap smear of cervix 11/22/2015  . Migraine 11/17/2015  . Vitamin D deficiency 11/17/2015   . Constipation-normal barium enema Sept 2013 02/06/2012  . Lapband APL + HH repair (1 suture) 01/28/2012   Teena Irani, PTA/CLT [phone]  Teena Irani 03/10/2018, 10:53 AM  [place], [place], [address] Phone: [phone]   Fax:  [phone]  Name: Cynthia [name] MRN: [id number] Date of Birth: Apr 14, 1966

## 2018-04-03 ENCOUNTER — Encounter (HOSPITAL_COMMUNITY): Payer: Self-pay | Admitting: Physical Therapy

## 2018-04-03 NOTE — Therapy (Signed)
[facility] [address] [place], [place], [address] Phone: [phone]   Fax:  [phone]  Patient Details  Name: Cynthia [name] MRN: [id number] Date of Birth: 09-Jun-1965 Referring Provider:  No ref. provider found  Encounter Date: 04/03/2018  PHYSICAL THERAPY DISCHARGE SUMMARY  Visits from Start of Care: 11  Current functional level related to goals / functional outcomes: Decreased volume of LE but not stable    Remaining deficits: edema   Education / Equipment: Self manual, exercises.  Plan: Patient agrees to discharge.  Patient goals were not met. Patient is being discharged due to not returning since the last visit.  ?????     Rayetta Humphrey, PT CLT [phone] 04/03/2018, 2:53 PM  [facility] [address] [place], [place], [address] Phone: [phone]   Fax:  [phone]

## 2018-04-25 DIAGNOSIS — I890 Lymphedema, not elsewhere classified: Secondary | ICD-10-CM | POA: Diagnosis not present

## 2018-05-09 ENCOUNTER — Ambulatory Visit (HOSPITAL_COMMUNITY): Payer: 59

## 2018-05-09 ENCOUNTER — Encounter (HOSPITAL_COMMUNITY): Payer: Self-pay | Admitting: Emergency Medicine

## 2018-05-09 ENCOUNTER — Ambulatory Visit (HOSPITAL_COMMUNITY)
Admission: EM | Admit: 2018-05-09 | Discharge: 2018-05-09 | Disposition: A | Discharge status: Home / Self Care | Payer: 59 | Attending: Internal Medicine | Admitting: Internal Medicine

## 2018-05-09 DIAGNOSIS — J40 Bronchitis, not specified as acute or chronic: Secondary | ICD-10-CM | POA: Diagnosis not present

## 2018-05-09 MED ORDER — ALBUTEROL SULFATE (2.5 MG/3ML) 0.083% IN NEBU
INHALATION_SOLUTION | RESPIRATORY_TRACT | Status: AC
  Filled 2018-05-09: qty 3

## 2018-05-09 MED ORDER — ALBUTEROL SULFATE HFA 108 (90 BASE) MCG/ACT IN AERS: 2.0000 | INHALATION_SPRAY | RESPIRATORY_TRACT | 0 refills | Status: DC | PRN

## 2018-05-09 MED ORDER — ALBUTEROL SULFATE (2.5 MG/3ML) 0.083% IN NEBU
2.5000 mg | INHALATION_SOLUTION | Freq: Once | RESPIRATORY_TRACT | Status: AC
  Administered 2018-05-09: 2.5 mg via RESPIRATORY_TRACT

## 2018-05-09 MED ORDER — DOXYCYCLINE HYCLATE 100 MG PO TABS: 100.0000 mg | ORAL_TABLET | Freq: Two times a day (BID) | ORAL | 0 refills | Status: DC

## 2018-05-09 MED FILL — VENTOLIN HFA 90 MCG INHALER: 108 (90 BAS | 16 days supply | Qty: 18 | Fill #0

## 2018-05-09 MED FILL — DOXYCYCLINE HYCLATE 100 MG: 100 | 10 days supply | Qty: 20 | Fill #0

## 2018-05-09 NOTE — ED Triage Notes (Signed)
Pt presents to [facility] for assessment of flu-like symptoms with fever, constant headaches, nasal congestion, cough, body-aches.

## 2018-05-09 NOTE — ED Provider Notes (Signed)
[name]    CSN: [id number] Arrival date & time: 05/09/18  1453     History   Chief Complaint Chief Complaint  Patient presents with  . URI    HPI Cynthia [name] is a 52 y.o. female.   Onset of HA 12/14 and on 12/15 started coughing and the cough has gradually gotten worse since and is productive with green mucous. She developed body aches, continuous HA, fever up to 101 5 days ago. Mid week she had ST and rhinitis. Has been fatigued and noting off after taking Tylenol for HA and fever. Her husband had similar symptoms but went to be seen the day he was sent home, was placed on Tamiflu and he is improving slowly.  + family hx of CHF on mother.      Past Medical History:  Diagnosis Date  . Chronic venous insufficiency 09/29/2017  . Deep venous thrombosis of lower extremity ([place]) 11/17/2015  . Depression   . History of DVT (deep vein thrombosis)   . Primary hyperparathyroidism ([place]) 11/28/2017  . Sleep difficulties     Patient Active Problem List   Diagnosis Date Noted  . Hyperparathyroidism ([place]) 11/28/2017  . Microcytic anemia 11/13/2017  . Low mean corpuscular volume (MCV) 11/13/2017  . Hypercalcemia 11/13/2017  . Sacroiliac joint dysfunction of both sides 10/21/2017  . Lumbar degenerative disc disease 10/21/2017  . Abnormal weight gain 10/21/2017  . Lymphedema 09/29/2017  . Primary osteoarthritis of left knee 09/19/2017  . Peripheral edema 09/19/2017  . Morbid obesity with BMI of 40.0-44.9, adult ([place]) 09/18/2017  . Urinary incontinence 09/18/2017  . History of DVT (deep vein thrombosis)   . Lesion of breast 06/18/2017  . LGSIL on Pap smear of cervix 11/22/2015  . Migraine 11/17/2015  . Vitamin D deficiency 11/17/2015  . Constipation-normal barium enema Sept 2013 02/06/2012  . Lapband APL + HH repair (1 suture) 01/28/2012    Past Surgical History:  Procedure Laterality Date  . COLONOSCOPY  2014  . GASTRIC RESTRICTION SURGERY     lap  band  . HERNIA REPAIR      OB History   No obstetric history on file.      Home Medications    Prior to Admission medications   Medication Sig Start Date End Date Taking? Authorizing Provider  aspirin EC 81 MG tablet Take 81 mg by mouth daily.   Yes [provider]  buPROPion (WELLBUTRIN XL) 150 MG 24 hr tablet Take 1 tablet (150 mg total) by mouth daily. 10/21/17  Yes Trixie Dredge, PA-C  cyclobenzaprine (FLEXERIL) 10 MG tablet Take 1 tablet (10 mg total) by mouth at bedtime as needed for muscle spasms. 10/04/17  Yes Trixie Dredge, PA-C  linaclotide [facility]) 290 MCG CAPS capsule Take 290 mcg by mouth daily before breakfast.   Yes [provider]  omeprazole (PRILOSEC) 20 MG capsule Take 1 capsule (20 mg total) by mouth daily. For GI prophylaxis while taking NSAIDs 10/21/17  Yes Trixie Dredge, PA-C  zolpidem (AMBIEN) 5 MG tablet Take 5 mg by mouth at bedtime as needed. For sleep    Yes [provider]  AMBULATORY NON FORMULARY MEDICATION Knee-high, medium compression, graduated compression stockings. Apply to bilateral lower extremities daily 11/04/17   Trixie Dredge, PA-C  Elastic Bandages & Supports (T.E.D. BELOW KNEE/X-LARGE) MISC Apply to lower extremities daily. Remove at bedtime 02/17/18   Trixie Dredge, PA-C  furosemide (LASIX) 40 MG tablet Take 1 tablet (40  mg total) by mouth daily. 01/23/18   Silverio Decamp, MD  ibuprofen (ADVIL,MOTRIN) 800 MG tablet Take 1 tablet (800 mg total) by mouth every 8 (eight) hours as needed for moderate pain. 12/03/17   Silverio Decamp, MD  Multiple Vitamins-Minerals (MULTIVITAMIN WITH MINERALS) tablet Take 1 tablet by mouth daily.    [provider]  NONFORMULARY OR COMPOUNDED ITEM Thigh-high medium compression stockings. Wear daily. Remove at bed time 02/18/18   Trixie Dredge, PA-C  NONFORMULARY OR COMPOUNDED ITEM Knee high  medium compression stockings. Wear daily. Remove at bedtime 02/18/18   Trixie Dredge, PA-C  vitamin B-12 (CYANOCOBALAMIN) 1000 MCG tablet Take 1,000 mcg by mouth daily.    [provider]    Family History Family History  Problem Relation Age of Onset  . Stroke Mother   . Hypertension Mother   . Heart disease Father   . Heart attack Father   . Alcohol abuse Father   . Prostate cancer Father   . Uterine cancer Maternal Grandmother     Social History Social History   Tobacco Use  . Smoking status: Never Smoker  . Smokeless tobacco: Never Used  Substance Use Topics  . Alcohol use: No  . Drug use: No     Allergies   Penicillin g and Latex   Review of Systems Review of Systems  Constitutional: Positive for activity change, appetite change, chills, fatigue and fever.       Been home sick all week and not allowed to return til she does not have fever for 48h.   HENT: Positive for postnasal drip and rhinorrhea. Negative for ear discharge, ear pain, sore throat and trouble swallowing.   Eyes: Negative for discharge.  Respiratory: Positive for cough, choking, shortness of breath and wheezing. Negative for chest tightness.        Wheezing qhs, SOB has been constant x 2 days  Cardiovascular: Positive for leg swelling. Negative for chest pain and palpitations.       Lymphedema is unchanged.   Gastrointestinal: Positive for nausea and vomiting. Negative for abdominal pain and diarrhea.       Vomited 5 days ago  Ones for 2 days , and none since. Has been nauseous when she eats.   Musculoskeletal: Positive for arthralgias and myalgias.  Skin: Negative for rash.  Neurological: Positive for headaches.  Hematological: Negative for adenopathy.     Physical Exam Triage Vital Signs ED Triage Vitals  Enc Vitals Group     BP 05/09/18 1508 (!) 116/59     Pulse Rate 05/09/18 1508 77     Resp 05/09/18 1508 18     Temp 05/09/18 1508 99.1 F (37.3 C)     Temp  Source 05/09/18 1508 Oral     SpO2 05/09/18 1508 100 %     Weight --      Height --      Head Circumference --      Peak Flow --      Pain Score 05/09/18 1509 9     Pain Loc --      Pain Edu? --      Excl. in [place]? --    No data found.  Updated Vital Signs BP (!) 116/59 (BP Location: Right Arm)   Pulse 77   Temp 99.1 F (37.3 C) (Oral)   Resp 18   LMP 04/24/2014   SpO2 100%   Visual Acuity Right Eye Distance:   Left Eye Distance:  Bilateral Distance:    Right Eye Near:   Left Eye Near:    Bilateral Near:     Physical Exam Vitals signs and nursing note reviewed.  HENT:     Head: Normocephalic.     Comments: Has tender bilateral ethmoid sinuses    Right Ear: Tympanic membrane, ear canal and external ear normal.     Left Ear: Tympanic membrane, ear canal and external ear normal.  Neck:     Musculoskeletal: Neck supple. No neck rigidity.  Pulmonary:     Effort: No respiratory distress.     Breath sounds: Normal breath sounds. No wheezing or rales.     Comments: Takes shallow breaths, but is not tachypnic and does not get SOB with talking full sentences.  Lymphadenopathy:     Cervical: No cervical adenopathy.  Neurological:     Mental Status: She is alert.    UC Treatments / Results  Labs (all labs ordered are listed, but only abnormal results are displayed) Labs Reviewed - No data to display  EKG None  Radiology No results found.  Procedures Procedures  Medications Ordered in UC Medications - No data to display  Initial Impression / Assessment and Plan / UC Course  I have reviewed the triage vital signs and the nursing notes. Her SOB improved after the neb treatment, we had her walk down the hall and her pulse ox dropped to 98, 97, and then back to 100. She said this helped her cough a lot of purulent green mucous. Her lung exam was unchanged, but her cough sounded more wheezy. I placed her on Albuterol inhaler and Doxy. If she gets worse, needs to come  back and will need a chest xray. Pt declined the xray initially and wanted to see how she felt with the neb treatment.      Final Clinical Impressions(s) / UC Diagnoses   Final diagnoses:  None   Discharge Instructions   None    ED Prescriptions    None     Controlled Substance Prescriptions [place] Controlled Substance Registry consulted?    Shelby Mattocks, PA-C 05/09/18 1729

## 2018-05-27 ENCOUNTER — Other Ambulatory Visit: Payer: Self-pay

## 2018-05-27 ENCOUNTER — Ambulatory Visit (INDEPENDENT_AMBULATORY_CARE_PROVIDER_SITE_OTHER): Payer: No Typology Code available for payment source | Admitting: Physician Assistant

## 2018-05-27 ENCOUNTER — Ambulatory Visit (INDEPENDENT_AMBULATORY_CARE_PROVIDER_SITE_OTHER): Payer: No Typology Code available for payment source

## 2018-05-27 ENCOUNTER — Encounter: Payer: Self-pay | Admitting: Physician Assistant

## 2018-05-27 VITALS — BP 117/67 | HR 56 | Temp 98.6°F | Wt 274.0 lb

## 2018-05-27 DIAGNOSIS — J9801 Acute bronchospasm: Secondary | ICD-10-CM

## 2018-05-27 DIAGNOSIS — R609 Edema, unspecified: Secondary | ICD-10-CM

## 2018-05-27 DIAGNOSIS — F330 Major depressive disorder, recurrent, mild: Secondary | ICD-10-CM

## 2018-05-27 DIAGNOSIS — R5383 Other fatigue: Secondary | ICD-10-CM | POA: Diagnosis not present

## 2018-05-27 DIAGNOSIS — R053 Chronic cough: Secondary | ICD-10-CM

## 2018-05-27 DIAGNOSIS — J3489 Other specified disorders of nose and nasal sinuses: Secondary | ICD-10-CM

## 2018-05-27 DIAGNOSIS — R05 Cough: Secondary | ICD-10-CM | POA: Diagnosis not present

## 2018-05-27 DIAGNOSIS — R0989 Other specified symptoms and signs involving the circulatory and respiratory systems: Secondary | ICD-10-CM | POA: Diagnosis not present

## 2018-05-27 DIAGNOSIS — N898 Other specified noninflammatory disorders of vagina: Secondary | ICD-10-CM

## 2018-05-27 DIAGNOSIS — R5381 Other malaise: Secondary | ICD-10-CM

## 2018-05-27 DIAGNOSIS — Z87898 Personal history of other specified conditions: Secondary | ICD-10-CM

## 2018-05-27 LAB — POCT INFLUENZA A/B
Influenza A, POC: NEGATIVE
Influenza B, POC: NEGATIVE

## 2018-05-27 MED ORDER — HYDROCOD POLST-CPM POLST ER 10-8 MG/5ML PO SUER: 5.0000 mL | Freq: Every evening | ORAL | 0 refills | Status: AC | PRN

## 2018-05-27 MED ORDER — IPRATROPIUM BROMIDE 0.06 % NA SOLN: 2.0000 | Freq: Four times a day (QID) | NASAL | 0 refills | Status: DC | PRN

## 2018-05-27 MED ORDER — SCOPOLAMINE 1 MG/3DAYS TD PT72: 1.0000 | MEDICATED_PATCH | TRANSDERMAL | 0 refills | Status: DC

## 2018-05-27 MED ORDER — NONFORMULARY OR COMPOUNDED ITEM: 0 refills | Status: DC

## 2018-05-27 MED ORDER — IPRATROPIUM BROMIDE HFA 17 MCG/ACT IN AERS: 1.0000 | INHALATION_SPRAY | Freq: Four times a day (QID) | RESPIRATORY_TRACT | 0 refills | Status: DC | PRN

## 2018-05-27 MED ORDER — PREDNISONE 50 MG PO TABS: ORAL_TABLET | ORAL | 0 refills | Status: DC

## 2018-05-27 MED ORDER — IPRATROPIUM-ALBUTEROL 0.5-2.5 (3) MG/3ML IN SOLN
3.0000 mL | Freq: Once | RESPIRATORY_TRACT | Status: AC
  Administered 2018-05-27: 3 mL via RESPIRATORY_TRACT

## 2018-05-27 MED ORDER — FLUCONAZOLE 150 MG PO TABS: ORAL_TABLET | ORAL | 0 refills | Status: DC

## 2018-05-27 MED ORDER — GUAIFENESIN ER 600 MG PO TB12: 1200.0000 mg | ORAL_TABLET | Freq: Two times a day (BID) | ORAL | Status: DC

## 2018-05-27 MED FILL — ATROVENT HFA INHALER: 17 | 50 days supply | Qty: 13 | Fill #0

## 2018-05-27 MED FILL — SCOPOLAMINE 1 MG/3DAYS PT72: 1 | 15 days supply | Qty: 5 | Fill #0

## 2018-05-27 MED FILL — predniSONE 50 MG TABS: 50 | 5 days supply | Qty: 5 | Fill #0

## 2018-05-27 MED FILL — HYDROCODONE-CHLORPHEN ER SU: 10-8 | 10 days supply | Qty: 50 | Fill #0

## 2018-05-27 MED FILL — FLUCONAZOLE 150 MG TABS: 150 | 3 days supply | Qty: 2 | Fill #0

## 2018-05-27 MED FILL — IPRATROPIUM 0.06% SPRAY: 0.06 | 10 days supply | Qty: 15 | Fill #0

## 2018-05-27 NOTE — Patient Instructions (Signed)
For nasal symptoms/sinusitis: - nasal saline rinses / netti pot several times per day (do this prior to nasal spray) - prescription Atrovent nasal spray: 2 sprays each nostril, up to 4 times per day as needed - you can use OTC nasal decongestant sprays like Afrin (oxymetazoline) BUT do not use for more than 3 days as it will cause worsening congestion/nasal symptoms) - warm facial compresses - oral decongestants and antihistamines like Claritin-D and Zyrtec-D may help dry up secretions (caution using decongestants if you have high blood pressure, heart disease or kidney disease) - for sinus headache: Tylenol 1000mg  every 8 hours as needed. Alternate with Ibuprofen 600mg  every 6 hours  For sore throat: - Tylenol 1000mg  every 8 hours as needed for throat pain. Alternate with Ibuprofen 600mg  every 6 hours - Cepacol throat lozenges and/or Chloraseptic spray - Warm salt water gargles  For cough: - Cough is a protective mechanism and an important part of fighting an infection. I encourage you to avoid suppressing your cough with medication during the day if possible.  - Mucinex with at least 8 oz. of water can loosen chest congestion and make cough more productive, which means you will actually cough less - Okay to use a cough suppressant at bedtime in order to rest  Note: follow package instructions for all over-the-counter medications. If using multi-symptom medications (Dayquil, Theraflu, etc.), check the label for duplicate drug ingredients.   Viral Respiratory Infection A respiratory infection is an illness that affects part of the respiratory system, such as the lungs, nose, or throat. A respiratory infection that is caused by a virus is called a viral respiratory infection. Common types of viral respiratory infections include:  A cold.  The flu (influenza).  A respiratory syncytial virus (RSV) infection. What are the causes? This condition is caused by a virus. What are the signs or  symptoms? Symptoms of this condition include:  A stuffy or runny nose.  Yellow or green nasal discharge.  A cough.  Sneezing.  Fatigue.  Achy muscles.  A sore throat.  Sweating or chills.  A fever.  A headache. How is this diagnosed? This condition may be diagnosed based on:  Your symptoms.  A physical exam.  Testing of nasal swabs. How is this treated? This condition may be treated with medicines, such as:  Antiviral medicine. This may shorten the length of time a person has symptoms.  Expectorants. These make it easier to cough up mucus.  Decongestant nasal sprays.  Acetaminophen or NSAIDs to relieve fever and pain. Antibiotic medicines are not prescribed for viral infections. This is because antibiotics are designed to kill bacteria. They are not effective against viruses. Follow these instructions at home:  Managing pain and congestion  Take over-the-counter and prescription medicines only as told by your health care provider.  If you have a sore throat, gargle with a salt-water mixture 3-4 times a day or as needed. To make a salt-water mixture, completely dissolve -1 tsp of salt in 1 cup of warm water.  Use nose drops made from salt water to ease congestion and soften raw skin around your nose.  Drink enough fluid to keep your urine pale yellow. This helps prevent dehydration and helps loosen up mucus. General instructions  Rest as much as possible.  Do not drink alcohol.  Do not use any products that contain nicotine or tobacco, such as cigarettes and e-cigarettes. If you need help quitting, ask your health care provider.  Keep all follow-up visits as  told by your health care provider. This is important. How is this prevented?   Get an annual flu shot. You may get the flu shot in late summer, fall, or winter. Ask your health care provider when you should get your flu shot.  Avoid exposing others to your respiratory infection. ? Stay home from  work or school as told by your health care provider. ? Wash your hands with soap and water often, especially after you cough or sneeze. If soap and water are not available, use alcohol-based hand sanitizer.  Avoid contact with people who are sick during cold and flu season. This is generally fall and winter. Contact a health care provider if:  Your symptoms last for 10 days or longer.  Your symptoms get worse over time.  You have a fever.  You have severe sinus pain in your face or forehead.  The glands in your jaw or neck become very swollen. Get help right away if you:  Feel pain or pressure in your chest.  Have shortness of breath.  Faint or feel like you will faint.  Have severe and persistent vomiting.  Feel confused or disoriented. Summary  A respiratory infection is an illness that affects part of the respiratory system, such as the lungs, nose, or throat. A respiratory infection that is caused by a virus is called a viral respiratory infection.  Common types of viral respiratory infections are a cold, influenza, and respiratory syncytial virus (RSV) infection.  Symptoms of this condition include a stuffy or runny nose, cough, sneezing, fatigue, achy muscles, sore throat, and fevers or chills.  Antibiotic medicines are not prescribed for viral infections. This is because antibiotics are designed to kill bacteria. They are not effective against viruses. This information is not intended to replace advice given to you by your health care provider. Make sure you discuss any questions you have with your health care provider. Document Released: 02/14/2005 Document Revised: 06/17/2017 Document Reviewed: 06/17/2017 Elsevier Interactive Patient Education  2019 [employer].

## 2018-05-27 NOTE — Progress Notes (Signed)
HPI:                                                                Cynthia [name] is a 53 y.o. female who presents to [facility] [place] [place] today for sinus symptoms  Diagnosed with acute bronchitis in urgent care on 05/09/18. Treated with bronchodilator and Doxycycline. Reports symptoms improved after completing antibiotic therapy, but symptoms recurred on January 1st. Presents today with malaise, fatigue, cough, chest congestion, post-nasal drip and sinus pressure. Cough is productive of a scant amount of clear-white mucous. Reports severe coughing spells where it's hard to catch her breath. Denies fever, chest pain, hemoptysis or dyspnea.  She also states she feels a vaginal yeast infection coming on after recent antibiotic use.  Requesting prescription for Diflucan.  Denies abnormal vaginal discharge, odor, abnormal bleeding, dyspareunia or pelvic pain..  She also reports she is going on vacation next week and would like a prescription for scopolamine patch for motion sickness.   Depression screen [facility] 2/9 05/27/2018 09/18/2017  Decreased Interest 3 0  Down, Depressed, Hopeless 1 0  PHQ - 2 Score 4 0  Altered sleeping 3 -  Tired, decreased energy 3 -  Change in appetite 3 -  Feeling bad or failure about yourself  0 -  Trouble concentrating 1 -  Moving slowly or fidgety/restless 0 -  Suicidal thoughts 0 -  PHQ-9 Score 14 -    GAD 7 : Generalized Anxiety Score 05/27/2018  Nervous, Anxious, on Edge 0  Worry too much - different things 0  Trouble relaxing 0  Restless 0  Easily annoyed or irritable 1  Afraid - awful might happen 0      Past Medical History:  Diagnosis Date  . Chronic venous insufficiency 09/29/2017  . Deep venous thrombosis of lower extremity ([place]) 11/17/2015  . Depression   . History of DVT (deep vein thrombosis)   . Primary hyperparathyroidism ([place]) 11/28/2017  . Sleep difficulties    Past Surgical History:   Procedure Laterality Date  . COLONOSCOPY  2014  . GASTRIC RESTRICTION SURGERY     lap band  . HERNIA REPAIR     Social History   Tobacco Use  . Smoking status: Never Smoker  . Smokeless tobacco: Never Used  Substance Use Topics  . Alcohol use: No   family history includes Alcohol abuse in her father; Heart attack in her father; Heart disease in her father; Hypertension in her mother; Prostate cancer in her father; Stroke in her mother; Uterine cancer in her maternal grandmother.    ROS: negative except as noted in the HPI  Medications: Current Outpatient Medications  Medication Sig Dispense Refill  . albuterol (VENTOLIN HFA) 108 (90 Base) MCG/ACT inhaler Inhale 2 puffs into the lungs every 4 (four) hours as needed for wheezing or shortness of breath (and cough x 3 days, then prn). 1 Inhaler 0  . AMBULATORY NON FORMULARY MEDICATION Knee-high, medium compression, graduated compression stockings. Apply to bilateral lower extremities daily 1 each 0  . aspirin EC 81 MG tablet Take 81 mg by mouth daily.    . cyclobenzaprine (FLEXERIL) 10 MG tablet Take 1 tablet (10 mg total) by mouth at bedtime as needed for muscle spasms. 30 tablet  0  . Elastic Bandages & Supports (T.E.D. BELOW KNEE/X-LARGE) MISC Apply to lower extremities daily. Remove at bedtime 2 each 0  . linaclotide (LINZESS) 290 MCG CAPS capsule Take 290 mcg by mouth daily before breakfast.    . omeprazole (PRILOSEC) 20 MG capsule Take 1 capsule (20 mg total) by mouth daily. For GI prophylaxis while taking NSAIDs 30 capsule 11  . vitamin B-12 (CYANOCOBALAMIN) 1000 MCG tablet Take 1,000 mcg by mouth daily.    Marland Kitchen zolpidem (AMBIEN) 5 MG tablet Take 5 mg by mouth at bedtime as needed. For sleep     . chlorpheniramine-HYDROcodone (TUSSIONEX) 10-8 MG/5ML SUER Take 5 mLs by mouth at bedtime as needed for up to 5 days for cough. 50 mL 0  . fluconazole (DIFLUCAN) 150 MG tablet Take 1 tab PO today. May repeat 1 tab PO in 72 hours if  symptoms persist/recur 2 tablet 0  . guaiFENesin (MUCINEX) 600 MG 12 hr tablet Take 2 tablets (1,200 mg total) by mouth 2 (two) times daily.    Marland Kitchen ipratropium (ATROVENT HFA) 17 MCG/ACT inhaler Inhale 1 puff into the lungs 4 (four) times daily as needed for wheezing (bronchospasm). 1 Inhaler 0  . ipratropium (ATROVENT) 0.06 % nasal spray Place 2 sprays into both nostrils 4 (four) times daily as needed for rhinitis. 15 mL 0  . NONFORMULARY OR COMPOUNDED ITEM Thigh-high medium compression stockings. Wear daily. Remove at bed time 2 each 0  . NONFORMULARY OR COMPOUNDED ITEM Knee high medium compression stockings. Wear daily. Remove at bedtime 2 each 0  . predniSONE (DELTASONE) 50 MG tablet One tab PO daily for 5 days. 5 tablet 0  . scopolamine (TRANSDERM-SCOP, 1.5 MG,) 1 MG/3DAYS Place 1 patch (1.5 mg total) onto the skin every 3 (three) days. 5 patch 0   No current facility-administered medications for this visit.    Allergies  Allergen Reactions  . Latex Rash    Where touched and will spread.  Marland Kitchen Penicillin G Itching       Objective:  BP 117/67   Pulse (!) 56   Temp 98.6 F (37 C) (Oral)   Wt 274 lb (124.3 kg)   LMP 04/24/2014   SpO2 99%   BMI 44.22 kg/m  Gen:  alert, not ill-appearing, no distress, appropriate for age 32: head normocephalic without obvious abnormality, conjunctiva and cornea clear, nasal mucosa edematous, oropharynx clear, uvula midline, neck supple,no cervical adenopathy, trachea midline Pulm: Normal work of breathing, normal phonation, clear to auscultation bilaterally, no wheezes, rales or rhonchi CV: bradycardic rate, regular rhythm, s1 and s2 distinct, no murmurs, clicks or rubs; bronchitic sounding cough Neuro: alert and oriented x 3, no tremor MSK: extremities atraumatic, normal gait and station Skin: intact, no rashes on exposed skin, no jaundice, no cyanosis    Results for orders placed or performed in visit on 05/27/18 (from the past 72 hour(s))   POCT Influenza A/B     Status: Normal   Collection Time: 05/27/18 10:14 AM  Result Value Ref Range   Influenza A, POC Negative Negative   Influenza B, POC Negative Negative   No results found.    Assessment and Plan: 53 y.o. female with   .Diagnoses and all orders for this visit:  Persistent cough for 3 weeks or longer -     DG Chest 2 View -     predniSONE (DELTASONE) 50 MG tablet; One tab PO daily for 5 days. -     guaiFENesin (MUCINEX) 600 MG 12  hr tablet; Take 2 tablets (1,200 mg total) by mouth 2 (two) times daily. -     chlorpheniramine-HYDROcodone ([place]) 10-8 MG/5ML SUER; Take 5 mLs by mouth at bedtime as needed for up to 5 days for cough.  Bronchospasm, acute -     ipratropium (ATROVENT HFA) 17 MCG/ACT inhaler; Inhale 1 puff into the lungs 4 (four) times daily as needed for wheezing (bronchospasm).  Sinus drainage -     ipratropium (ATROVENT) 0.06 % nasal spray; Place 2 sprays into both nostrils 4 (four) times daily as needed for rhinitis.  Malaise and fatigue -     POCT Influenza A/B  History of motion sickness -     scopolamine (TRANSDERM-SCOP, 1.5 MG,) 1 MG/3DAYS; Place 1 patch (1.5 mg total) onto the skin every 3 (three) days.  Vaginal pruritus -     fluconazole (DIFLUCAN) 150 MG tablet; Take 1 tab PO today. May repeat 1 tab PO in 72 hours if symptoms persist/recur  Mild episode of recurrent major depressive disorder (HCC)     Afebrile, no tachypnea, no tachycardia, pulse ox 99% on room air at rest, no adventitious lung sounds DuoNeb given in office today for bronchospasm Chest x-ray today to assess for infiltrate I do not think her symptoms represent true second sickening but rather a new viral infection given absence of fever, absence of purulent sputum or nasal drainage, absence of sinus tenderness, and overall improvement in her symptoms compared to 12/20. No risk factors for pneumonia. Postviral cough syndrome is also in the differential Counseled  on supportive care for viral respiratory infection/bronchospasm  Mildly positive depression screening today.  Patient self discontinued her Wellbutrin.  No acute safety issues.  Follow-up as needed     Patient education and anticipatory guidance given Patient agrees with treatment plan Follow-up as needed if symptoms worsen or fail to improve  Darlyne Russian PA-C

## 2018-05-27 NOTE — Addendum Note (Signed)
Addended by: Delrae Alfred on: 05/27/2018 10:32 AM   Modules accepted: Orders

## 2018-07-01 ENCOUNTER — Ambulatory Visit: Payer: 59 | Admitting: Internal Medicine

## 2018-07-01 ENCOUNTER — Telehealth: Payer: Self-pay | Admitting: Internal Medicine

## 2018-07-01 DIAGNOSIS — Z0289 Encounter for other administrative examinations: Secondary | ICD-10-CM

## 2018-07-01 NOTE — Telephone Encounter (Signed)
6 mo 

## 2018-07-01 NOTE — Telephone Encounter (Signed)
Patient no showed today's appt. Please advise on how to follow up. °A. No follow up necessary. °B. Follow up urgent. Contact patient immediately. °C. Follow up necessary. Contact patient and schedule visit in ___ days. °D. Follow up advised. Contact patient and schedule visit in ____weeks. ° °Would you like the NS fee to be applied to this visit? ° °

## 2018-08-08 ENCOUNTER — Encounter: Payer: Self-pay | Admitting: Sports Medicine

## 2018-08-08 ENCOUNTER — Other Ambulatory Visit: Payer: Self-pay

## 2018-08-08 ENCOUNTER — Ambulatory Visit (INDEPENDENT_AMBULATORY_CARE_PROVIDER_SITE_OTHER): Payer: No Typology Code available for payment source | Admitting: Sports Medicine

## 2018-08-08 DIAGNOSIS — Z9884 Bariatric surgery status: Secondary | ICD-10-CM

## 2018-08-08 DIAGNOSIS — M1712 Unilateral primary osteoarthritis, left knee: Secondary | ICD-10-CM

## 2018-08-08 DIAGNOSIS — M25551 Pain in right hip: Secondary | ICD-10-CM | POA: Insufficient documentation

## 2018-08-08 MED ORDER — CELECOXIB 200 MG PO CAPS: ORAL_CAPSULE | ORAL | 2 refills | Status: DC

## 2018-08-08 MED FILL — CELECOXIB 200 MG CAP: 200 | 30 days supply | Qty: 60 | Fill #0

## 2018-08-08 NOTE — Progress Notes (Signed)
Subjective:    CC: Left knee pain, right hip pain  HPI: Left knee pain: History of osteoarthritis, previous injection was about 8 months ago, now having recurrence of pain, moderate persistent and localized without radiation.  Desires repeat interventional treatment today.  Right knee pain, localized in the groin, radiation around to the buttock.  Patient uses a C-shaped sign to indicate where her pain is.  No mechanical symptoms, no trauma.  Oral NSAIDs, Tylenol are not efficacious.  Obesity: History of gastric band, she is gaining some weight.  I reviewed the past medical history, family history, social history, surgical history, and allergies today and no changes were needed.  Please see the problem list section below in epic for further details.  Past Medical History: Past Medical History:  Diagnosis Date  . Chronic venous insufficiency 09/29/2017  . Deep venous thrombosis of lower extremity ([place]) 11/17/2015  . Depression   . History of DVT (deep vein thrombosis)   . Primary hyperparathyroidism ([place]) 11/28/2017  . Sleep difficulties    Past Surgical History: Past Surgical History:  Procedure Laterality Date  . COLONOSCOPY  2014  . GASTRIC RESTRICTION SURGERY     lap band  . HERNIA REPAIR     Social History: Social History   Socioeconomic History  . Marital status: Married    Spouse name: Not on file  . Number of children: Not on file  . Years of education: Not on file  . Highest education level: Not on file  Occupational History  . Not on file  Social Needs  . Financial resource strain: Not on file  . Food insecurity:    Worry: Not on file    Inability: Not on file  . Transportation needs:    Medical: Not on file    Non-medical: Not on file  Tobacco Use  . Smoking status: Never Smoker  . Smokeless tobacco: Never Used  Substance and Sexual Activity  . Alcohol use: No  . Drug use: No  . Sexual activity: Yes    Birth control/protection: Post-menopausal   Lifestyle  . Physical activity:    Days per week: Not on file    Minutes per session: Not on file  . Stress: Not on file  Relationships  . Social connections:    Talks on phone: Not on file    Gets together: Not on file    Attends religious service: Not on file    Active member of club or organization: Not on file    Attends meetings of clubs or organizations: Not on file    Relationship status: Not on file  Other Topics Concern  . Not on file  Social History Narrative  . Not on file   Family History: Family History  Problem Relation Age of Onset  . Stroke Mother   . Hypertension Mother   . Heart disease Father   . Heart attack Father   . Alcohol abuse Father   . Prostate cancer Father   . Uterine cancer Maternal Grandmother    Allergies: Allergies  Allergen Reactions  . Latex Rash    Where touched and will spread.  Marland Kitchen Penicillin G Itching   Medications: See med rec.  Review of Systems: No fevers, chills, night sweats, weight loss, chest pain, or shortness of breath.   Objective:    General: Well Developed, well nourished, and in no acute distress.  Neuro: Alert and oriented x3, extra-ocular muscles intact, sensation grossly intact.  HEENT: Normocephalic, atraumatic, pupils equal  round reactive to light, neck supple, no masses, no lymphadenopathy, thyroid nonpalpable.  Skin: Warm and dry, no rashes. Cardiac: Regular rate and rhythm, no murmurs rubs or gallops, no lower extremity edema.  Respiratory: Clear to auscultation bilaterally. Not using accessory muscles, speaking in full sentences. Right hip: ROM IR: 60 Deg, ER: 60 Deg, Flexion: 120 Deg, Extension: 100 Deg, Abduction: 45 Deg, Adduction: 49 Deg, there is some reproduction of pain with internal rotation of the hip Strength IR: 5/5, ER: 5/5, Flexion: 5/5, Extension: 5/5, Abduction: 5/5, Adduction: 5/5 Pelvic alignment unremarkable to inspection and palpation. Standing hip rotation and gait without  trendelenburg / unsteadiness. Greater trochanter without tenderness to palpation. No tenderness over piriformis. No SI joint tenderness and normal minimal SI movement. Left knee: Normal to inspection with no erythema or effusion or obvious bony abnormalities. Tender to palpation of the medial joint line ROM normal in flexion and extension and lower leg rotation. Ligaments with solid consistent endpoints including ACL, PCL, LCL, MCL. Negative Mcmurray's and provocative meniscal tests. Non painful patellar compression. Patellar and quadriceps tendons unremarkable. Hamstring and quadriceps strength is normal.  Procedure: Real-time Ultrasound Guided injection of the left knee Device: GE Logiq E  Verbal informed consent obtained.  Time-out conducted.  Noted no overlying erythema, induration, or other signs of local infection.  Skin prepped in a sterile fashion.  Local anesthesia: Topical Ethyl chloride.  With sterile technique and under real time ultrasound guidance:  1 cc Kenalog 40, 2 cc lidocaine, 2 cc bupivacaine injected easily Completed without difficulty  Pain immediately resolved suggesting accurate placement of the medication.  Advised to call if fevers/chills, erythema, induration, drainage, or persistent bleeding.  Images permanently stored and available for review in the ultrasound unit.  Impression: Technically successful ultrasound guided injection.  Impression and Recommendations:    Primary osteoarthritis of left knee Previous injection was approximately 6 to 7 months ago, repeat injection today.  Right hip pain This is likely osteoarthritis. Doing formal physical therapy, switching to Celebrex. No x-rays for now.   Lapband APL + HH repair (1 suture) History of lap band. Unfortunately has continued to gain weight, ultimately my goal is to save her hips and her knee. Referral back to [facility] surgery to discuss conversion to sleeve gastrectomy.    ___________________________________________ Gwen Her. Dianah Field, M.D., ABFM., CAQSM. Primary Care and Sports Medicine [facility]  Adjunct Professor of [employer]

## 2018-08-08 NOTE — Assessment & Plan Note (Signed)
Previous injection was approximately 6 to 7 months ago, repeat injection today.

## 2018-08-08 NOTE — Assessment & Plan Note (Signed)
History of lap band. Unfortunately has continued to gain weight, ultimately my goal is to save her hips and her knee. Referral back to [facility] surgery to discuss conversion to sleeve gastrectomy.

## 2018-08-08 NOTE — Assessment & Plan Note (Signed)
This is likely osteoarthritis. Doing formal physical therapy, switching to Celebrex. No x-rays for now.

## 2018-08-14 ENCOUNTER — Encounter: Payer: Self-pay | Admitting: Sports Medicine

## 2018-09-03 ENCOUNTER — Encounter: Payer: No Typology Code available for payment source | Admitting: Physician Assistant

## 2018-09-05 ENCOUNTER — Ambulatory Visit: Payer: No Typology Code available for payment source | Admitting: Sports Medicine

## 2018-10-02 MED FILL — CELECOXIB 200 MG CAPSULE: 200 | 30 days supply | Qty: 60 | Fill #1

## 2018-10-08 ENCOUNTER — Encounter: Payer: No Typology Code available for payment source | Admitting: Physician Assistant

## 2018-10-09 ENCOUNTER — Ambulatory Visit (INDEPENDENT_AMBULATORY_CARE_PROVIDER_SITE_OTHER): Payer: No Typology Code available for payment source | Admitting: Physician Assistant

## 2018-10-09 ENCOUNTER — Encounter: Payer: Self-pay | Admitting: Physician Assistant

## 2018-10-09 VITALS — BP 122/80 | HR 55 | Wt 275.0 lb

## 2018-10-09 DIAGNOSIS — R001 Bradycardia, unspecified: Secondary | ICD-10-CM | POA: Insufficient documentation

## 2018-10-09 DIAGNOSIS — R062 Wheezing: Secondary | ICD-10-CM | POA: Diagnosis not present

## 2018-10-09 DIAGNOSIS — R1013 Epigastric pain: Secondary | ICD-10-CM

## 2018-10-09 DIAGNOSIS — R05 Cough: Secondary | ICD-10-CM | POA: Diagnosis not present

## 2018-10-09 DIAGNOSIS — Z0000 Encounter for general adult medical examination without abnormal findings: Secondary | ICD-10-CM | POA: Diagnosis not present

## 2018-10-09 DIAGNOSIS — R053 Chronic cough: Secondary | ICD-10-CM | POA: Insufficient documentation

## 2018-10-09 DIAGNOSIS — J385 Laryngeal spasm: Secondary | ICD-10-CM | POA: Insufficient documentation

## 2018-10-09 MED ORDER — BUDESONIDE-FORMOTEROL FUMARATE 80-4.5 MCG/ACT IN AERO: 2.0000 | INHALATION_SPRAY | Freq: Two times a day (BID) | RESPIRATORY_TRACT | 0 refills | Status: DC

## 2018-10-09 MED ORDER — OMEPRAZOLE 40 MG PO CPDR: 40.0000 mg | DELAYED_RELEASE_CAPSULE | Freq: Every day | ORAL | 0 refills | Status: DC

## 2018-10-09 MED FILL — SYMBICORT 80-4.5 MCG INH: 80-4.5 | 30 days supply | Qty: 10 | Fill #0

## 2018-10-09 MED FILL — OMEPRAZOLE 40 MG CPDR: 40 | 30 days supply | Qty: 30 | Fill #0

## 2018-10-09 NOTE — Progress Notes (Signed)
HPI:                                                                Cynthia [name] is a 53 y.o. female who presents to [facility] today for annual physical exam  Current Concerns include: Cough  Patient reports persistent cough for the last 6 months, that she states is progressively worsening.  She describes "a hard, dry cough" associated with wheezing.  Seems to be triggered by drinking liquids and anything pressing on her lower anterior neck such as car seat belt. She does endorse intermittent heartburn and indigestion for which she takes over-the-counter omeprazole 20 mg as needed. Denies constitutional symptoms, sputum production, shortness of breath. Endorses remote history of long-term secondhand smoke exposure as a child but denies any history of asthma or chronic lung disease. Reports family history of asthma in her mother Prior work-up includes negative chest x-ray January 2020 She has been using albuterol inhaler as needed and states that this does provide moderate relief of her symptoms  Also of note she was referred to endocrinology for hyperparathyroidism and she no showed this appointment in February.  Depression screen [facility] 2/9 10/09/2018 05/27/2018 09/18/2017  Decreased Interest 0 3 0  Down, Depressed, Hopeless 0 1 0  PHQ - 2 Score 0 4 0  Altered sleeping 2 3 -  Tired, decreased energy 1 3 -  Change in appetite 3 3 -  Feeling bad or failure about yourself  0 0 -  Trouble concentrating 0 1 -  Moving slowly or fidgety/restless 0 0 -  Suicidal thoughts 0 0 -  PHQ-9 Score 6 14 -    Patient Care Team: Trixie Dredge, PA-C as PCP - General (Physician Assistant) Earnstine Regal, PA-C as Physician Assistant (Obstetrics and Gynecology) Silverio Decamp, MD as Consulting Physician (Sports Medicine) Johnathan Hausen, MD as Consulting Physician [facility]) Philemon Kingdom, MD as Consulting Physician  (Endocrinology)  Health Maintenance Health Maintenance  Topic Date Due  . HIV Screening  02/13/1982  . INFLUENZA VACCINE  12/20/2018  . MAMMOGRAM  05/07/2019  . COLONOSCOPY  10/11/2019  . PAP SMEAR-Modifier  12/13/2021  . TETANUS/TDAP  09/19/2027    Past Medical History:  Diagnosis Date  . Chronic venous insufficiency 09/29/2017  . Deep venous thrombosis of lower extremity ([place]) 11/17/2015  . Depression   . History of DVT (deep vein thrombosis)   . Primary hyperparathyroidism ([place]) 11/28/2017  . Sleep difficulties    Past Surgical History:  Procedure Laterality Date  . COLONOSCOPY  2014  . GASTRIC RESTRICTION SURGERY     lap band  . HERNIA REPAIR     Social History   Tobacco Use  . Smoking status: Never Smoker  . Smokeless tobacco: Never Used  Substance Use Topics  . Alcohol use: No   family history includes Alcohol abuse in her father; Heart attack in her father; Heart disease in her father; Hypertension in her mother; Prostate cancer in her father; Stroke in her mother; Uterine cancer in her maternal grandmother.  ROS: negative except as noted in the HPI  Medications: Current Outpatient Medications  Medication Sig Dispense Refill  . albuterol (VENTOLIN HFA) 108 (90 Base) MCG/ACT inhaler Inhale 2 puffs into the lungs  every 4 (four) hours as needed for wheezing or shortness of breath (and cough x 3 days, then prn). 1 Inhaler 0  . AMBULATORY NON FORMULARY MEDICATION Knee-high, medium compression, graduated compression stockings. Apply to bilateral lower extremities daily 1 each 0  . aspirin EC 81 MG tablet Take 81 mg by mouth daily.    . celecoxib (CELEBREX) 200 MG capsule One to 2 tablets by mouth daily as needed for pain. 60 capsule 2  . cyclobenzaprine (FLEXERIL) 10 MG tablet Take 1 tablet (10 mg total) by mouth at bedtime as needed for muscle spasms. 30 tablet 0  . Elastic Bandages & Supports (T.E.D. BELOW KNEE/X-LARGE) MISC Apply to lower extremities daily. Remove  at bedtime 2 each 0  . linaclotide (LINZESS) 290 MCG CAPS capsule Take 290 mcg by mouth daily before breakfast.    . NONFORMULARY OR COMPOUNDED ITEM Thigh-high medium compression stockings. Wear daily. Remove at bed time 2 each 0  . NONFORMULARY OR COMPOUNDED ITEM Knee high medium compression stockings. Wear daily. Remove at bedtime 2 each 0  . vitamin B-12 (CYANOCOBALAMIN) 1000 MCG tablet Take 1,000 mcg by mouth daily.    Marland Kitchen zolpidem (AMBIEN) 5 MG tablet Take 5 mg by mouth at bedtime as needed. For sleep     . budesonide-formoterol (SYMBICORT) 80-4.5 MCG/ACT inhaler Inhale 2 puffs into the lungs 2 (two) times a day. 1 Inhaler 0  . omeprazole (PRILOSEC) 40 MG capsule Take 1 capsule (40 mg total) by mouth daily for 14 days. 30 capsule 0   No current facility-administered medications for this visit.    Allergies  Allergen Reactions  . Latex Rash    Where touched and will spread.  Marland Kitchen Penicillin G Itching       Objective:  BP 122/80   Pulse (!) 55   Wt 275 lb (124.7 kg)   LMP 04/24/2014   SpO2 98%   BMI 44.39 kg/m  General Appearance:  Alert, cooperative, no distress, appropriate for age, obese female                            Head:  Normocephalic, without obvious abnormality                             Eyes:  PERRL, EOM's intact, conjunctiva and cornea clear, wearing glasses                             Ears:  TM pearly gray color and semitransparent, external ear canals normal, both ears                            Nose:  Nares symmetrical, mucosa pink                          Throat:  Lips, tongue, and mucosa are moist, pink, and intact; oropharynx clear, uvula midline; good dentition                             Neck:  Supple; symmetrical, trachea midline, no adenopathy; thyroid: no enlargement, symmetric, no tenderness/mass/nodules  Back:  Symmetrical, no curvature, ROM normal               Chest/Breast: Deferred                           Lungs:  Clear to  auscultation bilaterally, respirations unlabored                             Heart: Bradycardic rate & regular rhythm, S1 and S2 normal, no murmurs, rubs, or gallops                     Abdomen:  Soft, non-tender, no mass or organomegaly              Genitourinary:  deferred         Musculoskeletal:  Tone and strength strong and symmetrical, all extremities; no joint pain, wearing compression socks, normal gait and station                                 Lymphatic:  No adenopathy             Skin/Hair/Nails:  Skin warm, dry and intact, no rashes or abnormal dyspigmentation on limited exam                   Neurologic:  Alert and oriented x3, no cranial nerve deficits, sensation grossly intact, normal gait and station, no tremor Psych: well-groomed, cooperative, good eye contact, euthymic mood, affect mood-congruent, speech is articulate, and thought processes clear and goal-directed  Lab Results  Component Value Date   CREATININE 0.85 11/11/2017   BUN 17 11/11/2017   NA 143 11/11/2017   K 3.7 11/11/2017   CL 110 11/11/2017   CO2 27 11/11/2017   Lab Results  Component Value Date   ALT 12 11/11/2017   AST 15 11/11/2017   ALKPHOS 51 10/26/2010   BILITOT 0.3 11/11/2017   Lab Results  Component Value Date   CHOL 140 11/11/2017   HDL 35 (L) 11/11/2017   LDLCALC 86 11/11/2017   TRIG 95 11/11/2017   CHOLHDL 4.0 11/11/2017   Lab Results  Component Value Date   WBC 5.0 11/11/2017   HGB 12.2 11/27/2017   HCT 38.6 11/27/2017   MCV 79.6 (L) 11/27/2017   PLT 199 11/11/2017   Lab Results  Component Value Date   HGBA1C 5.5 11/11/2017   Lab Results  Component Value Date   PTH 316 (H) 11/27/2017   CALCIUM 11.6 (H) 11/27/2017       Assessment and Plan: 53 y.o. female with   .Cynthia [name] was seen today for annual exam.  Diagnoses and all orders for this visit:  Encounter for annual physical exam  Persistent cough for 3 weeks or longer -     budesonide-formoterol (SYMBICORT)  80-4.5 MCG/ACT inhaler; Inhale 2 puffs into the lungs 2 (two) times a day.  Symptom of wheezing -     budesonide-formoterol (SYMBICORT) 80-4.5 MCG/ACT inhaler; Inhale 2 puffs into the lungs 2 (two) times a day.  Dyspepsia -     omeprazole (PRILOSEC) 40 MG capsule; Take 1 capsule (40 mg total) by mouth daily for 14 days.  Laryngospasms  Bradycardia with 51-60 beats per minute   - Personally reviewed PMH, PSH, PFH, medications, allergies, HM - Age-appropriate cancer screening: Pap smear up-to-date,  mammogram up-to-date per patient, requesting records from [facility], colonoscopy up-to-date - Tdap up-to-date - PHQ2 negative    Persistent nonproductive cough Given associated dyspepsia symptoms and cough being triggered by drinking or direct pressure on the larynx I suspect that her symptoms are due to laryngospasm Her subjective wheezing that improves with albuterol also makes reactive airway disease likely Starting omeprazole 40 mg daily for 2 weeks Starting Symbicort 2 puffs twice a day Counseled to limit use of Celebrex which may worsen reflux Would like to obtain PFT's once COVID-19 pandemic is no longer a factor for respiratory procedures  Patient education and anticipatory guidance given Patient agrees with treatment plan Follow-up E visit in 2 weeks or sooner as needed  Darlyne Russian PA-C

## 2018-10-09 NOTE — Patient Instructions (Addendum)
For cough: Start Symbicort 2 puffs twice a day.  Rinse mouth and throat really well after using.  I recommend taking the medication before brushing your teeth You may continue albuterol 1 to 2 puffs every 4 hours as needed.  Keep in mind that Symbicort does contain a long-acting bronchodilator similar to albuterol so if you take them very close to one another you may experience palpitations, anxiety, tremor Start omeprazole 40 mg daily for the next 2 weeks I would also recommend limiting use of your Celebrex during this time Let us plan to follow-up in an E visit in about 2 weeks

## 2018-10-21 ENCOUNTER — Other Ambulatory Visit: Payer: Self-pay

## 2018-10-21 ENCOUNTER — Telehealth: Payer: Self-pay

## 2018-10-21 DIAGNOSIS — R6889 Other general symptoms and signs: Secondary | ICD-10-CM

## 2018-10-21 NOTE — Telephone Encounter (Signed)
Pt called in after she filled out  Health at work questions , she said that she started to have sore throat, chills,and bodyaches on last Friday. She said that she called out of work on 10/20/18 but returned today on 10/21/18 not feeling any better. After reviewing her symptoms w/ on the on call nurse, I was advised to set the patient for testing. Made an appt for covid test at [facility] for 10/22/18 @ 10am.

## 2018-10-22 ENCOUNTER — Other Ambulatory Visit: Payer: Self-pay

## 2018-10-22 ENCOUNTER — Other Ambulatory Visit: Payer: No Typology Code available for payment source

## 2018-10-22 DIAGNOSIS — Z20828 Contact with and (suspected) exposure to other viral communicable diseases: Secondary | ICD-10-CM

## 2018-10-22 DIAGNOSIS — Z20822 Contact with and (suspected) exposure to covid-19: Secondary | ICD-10-CM

## 2018-10-22 LAB — NOVEL CORONAVIRUS, NAA: Novel Coronavirus 2019: NOT DETECTED

## 2018-10-23 ENCOUNTER — Ambulatory Visit (INDEPENDENT_AMBULATORY_CARE_PROVIDER_SITE_OTHER): Payer: No Typology Code available for payment source | Admitting: Physician Assistant

## 2018-10-23 ENCOUNTER — Encounter: Payer: Self-pay | Admitting: Physician Assistant

## 2018-10-23 ENCOUNTER — Telehealth: Payer: No Typology Code available for payment source | Admitting: Family

## 2018-10-23 VITALS — Wt 288.0 lb

## 2018-10-23 DIAGNOSIS — R059 Cough, unspecified: Secondary | ICD-10-CM

## 2018-10-23 DIAGNOSIS — R05 Cough: Secondary | ICD-10-CM | POA: Diagnosis not present

## 2018-10-23 DIAGNOSIS — R053 Chronic cough: Secondary | ICD-10-CM | POA: Insufficient documentation

## 2018-10-23 DIAGNOSIS — M25512 Pain in left shoulder: Secondary | ICD-10-CM | POA: Insufficient documentation

## 2018-10-23 DIAGNOSIS — R609 Edema, unspecified: Secondary | ICD-10-CM

## 2018-10-23 DIAGNOSIS — R635 Abnormal weight gain: Secondary | ICD-10-CM

## 2018-10-23 DIAGNOSIS — R0602 Shortness of breath: Secondary | ICD-10-CM | POA: Diagnosis not present

## 2018-10-23 DIAGNOSIS — D509 Iron deficiency anemia, unspecified: Secondary | ICD-10-CM

## 2018-10-23 DIAGNOSIS — E213 Hyperparathyroidism, unspecified: Secondary | ICD-10-CM

## 2018-10-23 NOTE — Progress Notes (Signed)
Virtual Visit via Video Note  I connected with Cynthia [name] on 11/10/18 at 10:30 AM EDT by a video enabled telemedicine application and verified that I am speaking with the correct person using two identifiers.   I discussed the limitations of evaluation and management by telemedicine and the availability of in person appointments. The patient expressed understanding and agreed to proceed.  History of Present Illness: HPI:                                                                Cynthia [name] is a 53 y.o. female   CC: cough follow-up   Patient reports persistent cough for the last 6 months, that she states is progressively worsening.  She describes "a hard, dry cough" associated with wheezing.  Seems to be triggered by drinking liquids and anything pressing on her lower anterior neck such as car seat belt. She does endorse intermittent heartburn and indigestion for which she takes over-the-counter omeprazole 20 mg as needed. Endorses remote history of long-term secondhand smoke exposure as a child but denies any history of asthma or chronic lung disease. Reports family history of asthma in her mother Prior work-up includes negative chest x-ray January 2020  She was started on Symbicort 2 puffs twice a day and Omeprazole 40 mg on 10/09/2018 for suspected reactive airway disease and laryngeal spasm. She states Omeprazole helps somewhat with digestive symptoms while eating She reports approx 2 weeks ago she had an episode of shortness of breath while driving and she used ventolin which was helpful She she has noticed no change in her cough since starting the Symbicort and feels the Ventolin helps her more than the Symbicort  She has noticed increased swelling and pitting edema in both of her legs worsened from baseline.  Associated with intermittent SOB and weight gain. Denies orthopnea/PND. She has a pending test for COVID-19 because she also experienced loss of taste  this week.  Also reports left anterior shoulder pain wrapping into the underam beginning yesterday. Reports ROM is limited and she is only able to lift her arm to 0 degrees with elbow bent like a chicken wing She has a history of shoulder bursitis and she reports symptoms are similar  Past Medical History:  Diagnosis Date  . Chronic venous insufficiency 09/29/2017  . Deep venous thrombosis of lower extremity ([place]) 11/17/2015  . Depression   . History of DVT (deep vein thrombosis)   . Primary hyperparathyroidism ([place]) 11/28/2017  . Sleep difficulties    Past Surgical History:  Procedure Laterality Date  . COLONOSCOPY  2014  . GASTRIC RESTRICTION SURGERY     lap band  . HERNIA REPAIR     Social History   Tobacco Use  . Smoking status: Never Smoker  . Smokeless tobacco: Never Used  Substance Use Topics  . Alcohol use: No   family history includes Alcohol abuse in her father; Heart attack in her father; Heart disease in her father; Hypertension in her mother; Prostate cancer in her father; Stroke in her mother; Uterine cancer in her maternal grandmother.    ROS: negative except as noted in the HPI  Medications: Current Outpatient Medications  Medication Sig Dispense Refill  . albuterol (VENTOLIN HFA) 108 (90 Base) MCG/ACT inhaler Inhale  2 puffs into the lungs every 4 (four) hours as needed for wheezing or shortness of breath (and cough x 3 days, then prn). 1 Inhaler 0  . AMBULATORY NON FORMULARY MEDICATION Knee-high, medium compression, graduated compression stockings. Apply to bilateral lower extremities daily 1 each 0  . aspirin EC 81 MG tablet Take 81 mg by mouth daily.    . celecoxib (CELEBREX) 200 MG capsule One to 2 tablets by mouth daily as needed for pain. 60 capsule 2  . cyclobenzaprine (FLEXERIL) 10 MG tablet Take 1 tablet (10 mg total) by mouth at bedtime as needed for muscle spasms. 30 tablet 0  . Elastic Bandages & Supports (T.E.D. BELOW KNEE/X-LARGE) MISC Apply to  lower extremities daily. Remove at bedtime 2 each 0  . linaclotide (LINZESS) 290 MCG CAPS capsule Take 290 mcg by mouth daily before breakfast.    . NONFORMULARY OR COMPOUNDED ITEM Thigh-high medium compression stockings. Wear daily. Remove at bed time 2 each 0  . NONFORMULARY OR COMPOUNDED ITEM Knee high medium compression stockings. Wear daily. Remove at bedtime 2 each 0  . omeprazole (PRILOSEC) 40 MG capsule Take 1 capsule (40 mg total) by mouth daily for 14 days. 30 capsule 0  . vitamin B-12 (CYANOCOBALAMIN) 1000 MCG tablet Take 1,000 mcg by mouth daily.    Marland Kitchen zolpidem (AMBIEN) 5 MG tablet Take 5 mg by mouth at bedtime as needed. For sleep     . hydrochlorothiazide (HYDRODIURIL) 25 MG tablet Take 0.5 tablets (12.5 mg total) by mouth 2 (two) times daily as needed (swelling). 60 tablet 0   No current facility-administered medications for this visit.    Allergies  Allergen Reactions  . Latex Rash    Where touched and will spread.  Marland Kitchen Penicillin G Itching       Objective:  Wt 288 lb (130.6 kg)   LMP 04/24/2014   BMI 46.48 kg/m  Gen:  alert, not ill-appearing, no distress, appropriate for age 33: head normocephalic without obvious abnormality, conjunctiva and cornea clear, trachea midline Pulm: Normal work of breathing, normal phonation Neuro: alert and oriented x 3 Psych: cooperative, euthymic mood, affect mood-congruent, speech is articulate, normal rate and volume; thought processes clear and goal-directed, normal judgment, good insight  BP Readings from Last 3 Encounters:  10/28/18 117/74  10/09/18 122/80  08/08/18 124/71   Pulse Readings from Last 3 Encounters:  10/28/18 68  10/09/18 (!) 55  08/08/18 73    Wt Readings from Last 3 Encounters:  10/28/18 281 lb 4 oz (127.6 kg)  10/23/18 288 lb (130.6 kg)  10/09/18 275 lb (124.7 kg)     No results found for this or any previous visit (from the past 75 hour(s)). No results found.    Assessment and Plan: 53 y.o.  female with   .Cynthia [name] was seen today for follow-up.  Diagnoses and all orders for this visit:  Chronic cough  Shortness of breath -     B Nat Peptide  Left anterior shoulder pain  Peripheral edema -     B Nat Peptide  Abnormal weight gain -     Hemoglobin A1c  Microcytic anemia -     Fe+TIBC+Fer -     CBC with Differential/Platelet  Hypercalcemia -     VITAMIN D 25 Hydroxy (Vit-D Deficiency, Fractures) -     PTH, Intact and Calcium -     Magnesium -     Phosphorus -     COMPLETE METABOLIC PANEL WITH GFR  Hyperparathyroidism ([place]) -     VITAMIN D 25 Hydroxy (Vit-D Deficiency, Fractures) -     PTH, Intact and Calcium -     Magnesium -     Phosphorus -     COMPLETE METABOLIC PANEL WITH GFR   Chronic cough, SOB, peripheral edema Patient unable to provide vital signs today.  Exam is limited due to the nature of this video visit.  She does not appear to be in any respiratory distress. She reports a worsening in her baseline edema which is due to known venous insufficiency.  She has no prior history of heart failure and denies orthopnea and PND.  She does endorse weight gain of 13 pounds with her worsening edema and shortness of breath Shortness of breath is responsive to short acting beta agonist.  Continue Ventolin 1 to 2 puffs every 4 hours as needed COVID-19 test pending. Await result and then schedule in office f/u  CBC with differential, CMP, BNP pending  Left anterior shoulder pain Start Celebrex 200 mg bid x 7 days Continue Omeprazole Await result of COVID test to schedule f/u with Sports Medicine  Follow Up Instructions:    I discussed the assessment and treatment plan with the patient. The patient was provided an opportunity to ask questions and all were answered. The patient agreed with the plan and demonstrated an understanding of the instructions.   The patient was advised to call back or seek an in-person evaluation if the symptoms worsen or if the  condition fails to improve as anticipated.  I provided 25 minutes of non-face-to-face time during this encounter.   Trixie Dredge, [place]

## 2018-10-23 NOTE — Progress Notes (Signed)
Based on what you shared with me, I feel your condition warrants further evaluation and I recommend that you be seen for a face to face office visit. You may see your primary care provider.    NOTE: If you entered your credit card information for this eVisit, you will not be charged. You may see a "hold" on your card for the $35 but that hold will drop off and you will not have a charge processed.  If you are having a true medical emergency please call 911.  If you need an urgent face to face visit, [facility] has four urgent care centers for your convenience.    PLEASE NOTE: THE [facility] LOCATIONS AND URGENT CARE CLINICS DO NOT HAVE THE TESTING FOR CORONAVIRUS COVID19 AVAILABLE.  IF YOU FEEL YOU NEED THIS TEST YOU MUST GO TO A TRIAGE LOCATION AT [place]   [email or web address] to reserve your spot online an avoid wait times  [facility] [address], [address] [place], [place] [address] Modified hours of operation: Monday-Friday, 12 PM to 6 PM  Saturday & Sunday 10 AM to 4 PM *Across the street from [place] (New Address!) [address], [place], [place] [address] *Just off [address], across the road from [place] hours of operation: Monday-Friday, 12 PM to 6 PM  Closed Saturday & Sunday  [facility]'s modified hours of operation will be in effect from May 1 until May 31   The following sites will take your insurance:  . [facility] [place] a Provider at this Location  [address] [place], [place] [address] . 10 am to 8 pm Monday-Friday . 12 pm to 8 pm Saturday-Sunday   . [facility] at [place] a Provider at this Location  [address], [place], [place] [address] . 8 am to 8 pm Monday-Friday . 9 am to 6 pm Saturday .  11 am to 6 pm Sunday   . [facility] at [place] Get Driving Directions  [address] [place], [place] [address] . 8 am to 8 pm Monday-Friday . 8 am to 4 pm Saturday-Sunday   Your e-visit answers were reviewed by a board certified advanced clinical practitioner to complete your personal care plan.  Thank you for using e-Visits.

## 2018-10-28 ENCOUNTER — Encounter: Payer: Self-pay | Admitting: Physician Assistant

## 2018-10-28 ENCOUNTER — Ambulatory Visit (INDEPENDENT_AMBULATORY_CARE_PROVIDER_SITE_OTHER): Payer: No Typology Code available for payment source | Admitting: Physician Assistant

## 2018-10-28 VITALS — BP 117/74 | HR 68 | Temp 98.6°F | Wt 281.2 lb

## 2018-10-28 DIAGNOSIS — R053 Chronic cough: Secondary | ICD-10-CM

## 2018-10-28 DIAGNOSIS — R0609 Other forms of dyspnea: Secondary | ICD-10-CM | POA: Diagnosis not present

## 2018-10-28 DIAGNOSIS — R609 Edema, unspecified: Secondary | ICD-10-CM | POA: Diagnosis not present

## 2018-10-28 DIAGNOSIS — R05 Cough: Secondary | ICD-10-CM

## 2018-10-28 DIAGNOSIS — R600 Localized edema: Secondary | ICD-10-CM

## 2018-10-28 DIAGNOSIS — R0600 Dyspnea, unspecified: Secondary | ICD-10-CM

## 2018-10-28 MED ORDER — HYDROCHLOROTHIAZIDE 25 MG PO TABS: 12.5000 mg | ORAL_TABLET | Freq: Two times a day (BID) | ORAL | 0 refills | Status: DC | PRN

## 2018-10-28 MED FILL — HYDROCHLOROTHIAZIDE 25 MG T: 25 | 60 days supply | Qty: 60 | Fill #0

## 2018-10-28 NOTE — Patient Instructions (Signed)
Shortness of Breath, Adult  Shortness of breath is when a person has trouble breathing enough air or when a person feels like she or he is having trouble breathing in enough air. Shortness of breath could be a sign of a medical problem.  Follow these instructions at home:     Pay attention to any changes in your symptoms.   Do not use any products that contain nicotine or tobacco, such as cigarettes, e-cigarettes, and chewing tobacco.   Do not smoke. Smoking is a common cause of shortness of breath. If you need help quitting, ask your health care provider.   Avoid things that can irritate your airways, such as:  ? Mold.  ? Dust.  ? Air pollution.  ? Chemical fumes.  ? Things that can cause allergy symptoms (allergens), if you have allergies.   Keep your living space clean and free of mold and dust.   Rest as needed. Slowly return to your usual activities.   Take over-the-counter and prescription medicines only as told by your health care provider. This includes oxygen therapy and inhaled medicines.   Keep all follow-up visits as told by your health care provider. This is important.  Contact a health care provider if:   Your condition does not improve as soon as expected.   You have a hard time doing your normal activities, even after you rest.   You have new symptoms.  Get help right away if:   Your shortness of breath gets worse.   You have shortness of breath when you are resting.   You feel light-headed or you faint.   You have a cough that is not controlled with medicines.   You cough up blood.   You have pain with breathing.   You have pain in your chest, arms, shoulders, or abdomen.   You have a fever.   You cannot walk up stairs or exercise the way that you normally do.  These symptoms may represent a serious problem that is an emergency. Do not wait to see if the symptoms will go away. Get medical help right away. Call your local emergency services (911 in the [place] Do not drive yourself  to the hospital.  Summary   Shortness of breath is when a person has trouble breathing enough air. It can be a sign of a medical problem.   Avoid things that irritate your lungs, such as smoking, pollution, mold, and dust.   Pay attention to changes in your symptoms and contact your health care provider if you have a hard time completing daily activities because of shortness of breath.  This information is not intended to replace advice given to you by your health care provider. Make sure you discuss any questions you have with your health care provider.  Document Released: 01/30/2001 Document Revised: 10/07/2017 Document Reviewed: 10/07/2017  Elsevier Interactive Patient Education  2019 [employer]

## 2018-10-28 NOTE — Progress Notes (Signed)
HPI:                                                                Cynthia [name] is a 53 y.o. female who presents to [facility] [place]: [place] today for edema / chronic cough  Patient with hx of venous insufficiency, lymphedema, hx of DVT LLE, anemia, hyperparathyroidism, obesity reports persistent cough for the last 6 months, that she states is progressively worsening. She describes "a hard,dry cough"associated with wheezing.Seems to be triggered by drinking liquids and anything pressing on her lower anterior neck such as car seat belt. She does endorse intermittent heartburn and indigestion for which she currently takes Omeprazole 40 mg QD. She was started on Symbicort bid for suspected reactive airway disease, but did not feel there was any improvement and found Ventolin was more effective, so this was discontinued.   She has noticed increased swelling and pitting edema in both of her legs (L>>R) worsened from baseline. She has a remote hx of LLE DVT and reports her left leg has remained chronically swollen compared to her right. Most recent US have shown venous insufficiency. Endorses intermittent dyspnea on mild exertion. Endorses weight gain. Home scale weight 283 today, 287 last week. Denies orthopnea/PND or exertional chest pain. Denies constitutional symptoms, sputum production, shortness of breath.   Endorses remote history of long-term secondhand smoke exposure as a child but denies any history of asthma or chronic lung disease. Reports family history of asthma in her mother   Prior work-up: 10/22/18 - negative SARS-CoV-2 05/27/18 - negative chest x-ray 09/24/17 - [place] Lower Extremity - no DVT, abnormal reflux times on the left side  She has been using albuterol inhaler as needed and states that this does provide moderate relief of her symptoms She has never had an echo. She has taken lasix in the past, reports it made her feel  dizzy/lightheaded. She wears compression socks at work and occ at home.    Past Medical History:  Diagnosis Date  . Chronic venous insufficiency 09/29/2017  . Deep venous thrombosis of lower extremity ([place]) 11/17/2015  . Depression   . History of DVT (deep vein thrombosis)   . Primary hyperparathyroidism ([place]) 11/28/2017  . Sleep difficulties    Past Surgical History:  Procedure Laterality Date  . COLONOSCOPY  2014  . GASTRIC RESTRICTION SURGERY     lap band  . HERNIA REPAIR     Social History   Tobacco Use  . Smoking status: Never Smoker  . Smokeless tobacco: Never Used  Substance Use Topics  . Alcohol use: No   family history includes Alcohol abuse in her father; Heart attack in her father; Heart disease in her father; Hypertension in her mother; Prostate cancer in her father; Stroke in her mother; Uterine cancer in her maternal grandmother.    ROS: negative except as noted in the HPI  Medications: Current Outpatient Medications  Medication Sig Dispense Refill  . albuterol (VENTOLIN HFA) 108 (90 Base) MCG/ACT inhaler Inhale 2 puffs into the lungs every 4 (four) hours as needed for wheezing or shortness of breath (and cough x 3 days, then prn). 1 Inhaler 0  . AMBULATORY NON FORMULARY MEDICATION Knee-high, medium compression, graduated compression stockings. Apply to bilateral lower extremities  daily 1 each 0  . aspirin EC 81 MG tablet Take 81 mg by mouth daily.    . celecoxib (CELEBREX) 200 MG capsule One to 2 tablets by mouth daily as needed for pain. 60 capsule 2  . cyclobenzaprine (FLEXERIL) 10 MG tablet Take 1 tablet (10 mg total) by mouth at bedtime as needed for muscle spasms. 30 tablet 0  . Elastic Bandages & Supports (T.E.D. BELOW KNEE/X-LARGE) MISC Apply to lower extremities daily. Remove at bedtime 2 each 0  . linaclotide (LINZESS) 290 MCG CAPS capsule Take 290 mcg by mouth daily before breakfast.    . NONFORMULARY OR COMPOUNDED ITEM Thigh-high medium  compression stockings. Wear daily. Remove at bed time 2 each 0  . NONFORMULARY OR COMPOUNDED ITEM Knee high medium compression stockings. Wear daily. Remove at bedtime 2 each 0  . vitamin B-12 (CYANOCOBALAMIN) 1000 MCG tablet Take 1,000 mcg by mouth daily.    Marland Kitchen zolpidem (AMBIEN) 5 MG tablet Take 5 mg by mouth at bedtime as needed. For sleep     . hydrochlorothiazide (HYDRODIURIL) 25 MG tablet Take 0.5 tablets (12.5 mg total) by mouth 2 (two) times daily as needed (swelling). 60 tablet 0  . omeprazole (PRILOSEC) 40 MG capsule Take 1 capsule (40 mg total) by mouth daily for 14 days. 30 capsule 0   No current facility-administered medications for this visit.    Allergies  Allergen Reactions  . Latex Rash    Where touched and will spread.  Marland Kitchen Penicillin G Itching       Objective:  BP 117/74   Pulse 68   Temp 98.6 F (37 C) (Oral)   Wt 281 lb 4 oz (127.6 kg)   LMP 04/24/2014   SpO2 97%   BMI 45.39 kg/m  Gen:  alert, not ill-appearing, no distress, appropriate for age 53: head normocephalic without obvious abnormality, conjunctiva and cornea clear, trachea midline Pulm: Normal work of breathing, normal phonation, clear to auscultation bilaterally, no wheezes, rales or rhonchi CV: Normal rate, regular rhythm, s1 and s2 distinct, no murmurs, clicks or rubs  Neuro: alert and oriented x 3, no tremor MSK: extremities atraumatic, normal gait and station, bilateral lower extremity edema left 3+ right 1+, distal pulses intact, no calf tenderness or cords  Skin: intact, no rashes on exposed skin, no jaundice, no cyanosis Psych: well-groomed, cooperative, good eye contact, euthymic mood, affect mood-congruent, speech is articulate, and thought processes clear and goal-directed  BP Readings from Last 3 Encounters:  10/28/18 117/74  10/09/18 122/80  08/08/18 124/71   Wt Readings from Last 3 Encounters:  10/28/18 281 lb 4 oz (127.6 kg)  10/23/18 288 lb (130.6 kg)  10/09/18 275 lb (124.7  kg)     No results found for this or any previous visit (from the past 11 hour(s)). No results found.  Recent Results (from the past 2160 hour(s))  B Nat Peptide     Status: None   Collection Time: 10/28/18  1:42 PM  Result Value Ref Range   Brain Natriuretic Peptide 34 <100 pg/mL    Comment: . BNP levels increase with age in the general population with the highest values seen in individuals greater than 59 years of age. Reference: J. Am. [place] Ar. Cardiol. 2002; 35:456-256. Marland Kitchen   VITAMIN D 25 Hydroxy (Vit-D Deficiency, Fractures)     Status: Abnormal   Collection Time: 10/28/18  1:42 PM  Result Value Ref Range   Vit D, 25-Hydroxy 21 (L) 30 - 100 ng/mL  Comment: Vitamin D Status         25-OH Vitamin D: . Deficiency:                    <20 ng/mL Insufficiency:             20 - 29 ng/mL Optimal:                 > or = 30 ng/mL . For 25-OH Vitamin D testing on patients on  D2-supplementation and patients for whom quantitation  of D2 and D3 fractions is required, the [employer](TM) 25-OH VIT D, (D2,D3), LC/MS/MS is recommended: order  code [id number] (patients >72yrs). See Note 1 . Note 1 . For additional information, please refer to  http://education.[facility].com/faq/FAQ199  (This link is being provided for informational/ educational purposes only.)   PTH, Intact and Calcium     Status: Abnormal   Collection Time: 10/28/18  1:42 PM  Result Value Ref Range   PTH 207 (H) 14 - 64 pg/mL    Comment: . Interpretive Guide    Intact PTH           Calcium ------------------    ----------           ------- Normal Parathyroid    Normal               Normal Hypoparathyroidism    Low or Low Normal    Low Hyperparathyroidism    Primary            Normal or High       High    Secondary          High                 Normal or Low    Tertiary           High                 High Non-Parathyroid    Hypercalcemia      Low or Low Normal    High .    Calcium 11.1 (H) 8.6 - 10.4  mg/dL  Magnesium     Status: None   Collection Time: 10/28/18  1:42 PM  Result Value Ref Range   Magnesium 2.1 1.5 - 2.5 mg/dL  Phosphorus     Status: None   Collection Time: 10/28/18  1:42 PM  Result Value Ref Range   Phosphorus 2.8 2.5 - 4.5 mg/dL  COMPLETE METABOLIC PANEL WITH GFR     Status: Abnormal   Collection Time: 10/28/18  1:42 PM  Result Value Ref Range   Glucose, Bld 90 65 - 99 mg/dL    Comment: .            Fasting reference interval .    BUN 12 7 - 25 mg/dL   Creat 0.80 0.50 - 1.05 mg/dL    Comment: For patients >72 years of age, the reference limit for Creatinine is approximately 13% higher for people identified as African-American. .    GFR, Est Non African American 85 > OR = 60 mL/min/1.54m2   GFR, Est African American 99 > OR = 60 mL/min/1.71m2   BUN/Creatinine Ratio NOT APPLICABLE 6 - 22 (calc)   Sodium 143 135 - 146 mmol/L   Potassium 4.3 3.5 - 5.3 mmol/L   Chloride 109 98 - 110 mmol/L   CO2 29 20 - 32 mmol/L   Calcium 11.1 (H) 8.6 - 10.4 mg/dL  Total Protein 6.4 6.1 - 8.1 g/dL   Albumin 4.0 3.6 - 5.1 g/dL   Globulin 2.4 1.9 - 3.7 g/dL (calc)   AG Ratio 1.7 1.0 - 2.5 (calc)   Total Bilirubin 0.4 0.2 - 1.2 mg/dL   Alkaline phosphatase (APISO) 73 37 - 153 U/L   AST 13 10 - 35 U/L   ALT 13 6 - 29 U/L  Fe+TIBC+Fer     Status: None   Collection Time: 10/28/18  1:42 PM  Result Value Ref Range   Iron 75 45 - 160 mcg/dL   TIBC 302 250 - 450 mcg/dL (calc)   %SAT 25 16 - 45 % (calc)   Ferritin 101 16 - 232 ng/mL  Hemoglobin A1c     Status: None   Collection Time: 10/28/18  1:42 PM  Result Value Ref Range   Hgb A1c MFr Bld 5.4 <5.7 % of total Hgb    Comment: For the purpose of screening for the presence of diabetes: . <5.7%       Consistent with the absence of diabetes 5.7-6.4%    Consistent with increased risk for diabetes             (prediabetes) > or =6.5%  Consistent with diabetes . This assay result is consistent with a decreased risk of  diabetes. . Currently, no consensus exists regarding use of hemoglobin A1c for diagnosis of diabetes in children. . According to American Diabetes Association (ADA) guidelines, hemoglobin A1c <7.0% represents optimal control in non-pregnant diabetic patients. Different metrics may apply to specific patient populations.  Standards of Medical Care in Diabetes(ADA). .    Mean Plasma Glucose 108 (calc)   eAG (mmol/L) 6.0 (calc)  CBC with Differential/Platelet     Status: Abnormal   Collection Time: 10/28/18  1:42 PM  Result Value Ref Range   WBC 3.9 3.8 - 10.8 Thousand/uL   RBC 5.01 3.80 - 5.10 Million/uL   Hemoglobin 12.9 11.7 - 15.5 g/dL   HCT 39.6 35.0 - 45.0 %   MCV 79.0 (L) 80.0 - 100.0 fL   MCH 25.7 (L) 27.0 - 33.0 pg   MCHC 32.6 32.0 - 36.0 g/dL   RDW 13.6 11.0 - 15.0 %   Platelets 209 140 - 400 Thousand/uL   MPV 10.9 7.5 - 12.5 fL   Neutro Abs 2,266 1,500 - 7,800 cells/uL   Lymphs Abs 1,049 850 - 3,900 cells/uL   Absolute Monocytes 312 200 - 950 cells/uL   Eosinophils Absolute 242 15 - 500 cells/uL   Basophils Absolute 31 0 - 200 cells/uL   Neutrophils Relative % 58.1 %   Total Lymphocyte 26.9 %   Monocytes Relative 8.0 %   Eosinophils Relative 6.2 %   Basophils Relative 0.8 %     Assessment and Plan: 53 y.o. female with   .Cynthia [name] was seen today for cough and edema.  Diagnoses and all orders for this visit:  Dyspnea on exertion -     DG Chest 2 View -     ECHOCARDIOGRAM COMPLETE; Future  Peripheral edema -     hydrochlorothiazide (HYDRODIURIL) 25 MG tablet; Take 0.5 tablets (12.5 mg total) by mouth 2 (two) times daily as needed (swelling).  Chronic cough -     Ambulatory referral to Pulmonology    Afebrile, no tachypnea, no tachycardia, BP normotensive, pulse ox 97% on RA at rest, no adventitious lung sounds, bilateral lower extremity edema left 3+ right 1+. She is not wearing compression socks today Due  to change in exercise tolerance and mild DOE,  will obtain Echo BNP, CBC w/diff, CMP pending CXR pending Hx of intolerance with Lasix. Start HCTZ 25 mg QD. Encouraged to continue wearing compression stockings daily D/C Symbicort - no therapeutic improvement Referral to Pulm for chronic cough  Patient education and anticipatory guidance given Patient agrees with treatment plan Follow-up in 2 weeks or sooner as needed if symptoms worsen or fail to improve  Darlyne Russian PA-C

## 2018-10-29 ENCOUNTER — Ambulatory Visit: Payer: No Typology Code available for payment source | Admitting: Sports Medicine

## 2018-10-29 LAB — CBC WITH DIFFERENTIAL/PLATELET
Absolute Monocytes: 312 cells/uL (ref 200–950)
Basophils Absolute: 31 cells/uL (ref 0–200)
Basophils Relative: 0.8 %
Eosinophils Absolute: 242 cells/uL (ref 15–500)
Eosinophils Relative: 6.2 %
HCT: 39.6 % (ref 35.0–45.0)
Hemoglobin: 12.9 g/dL (ref 11.7–15.5)
Lymphs Abs: 1049 cells/uL (ref 850–3900)
MCH: 25.7 pg — ABNORMAL LOW (ref 27.0–33.0)
MCHC: 32.6 g/dL (ref 32.0–36.0)
MCV: 79 fL — ABNORMAL LOW (ref 80.0–100.0)
MPV: 10.9 fL (ref 7.5–12.5)
Monocytes Relative: 8 %
Neutro Abs: 2266 cells/uL (ref 1500–7800)
Neutrophils Relative %: 58.1 %
Platelets: 209 10*3/uL (ref 140–400)
RBC: 5.01 10*6/uL (ref 3.80–5.10)
RDW: 13.6 % (ref 11.0–15.0)
Total Lymphocyte: 26.9 %
WBC: 3.9 10*3/uL (ref 3.8–10.8)

## 2018-10-29 LAB — HEMOGLOBIN A1C
Hgb A1c MFr Bld: 5.4 % of total Hgb (ref <5.7)
Mean Plasma Glucose: 108 (calc)
eAG (mmol/L): 6 (calc)

## 2018-10-29 LAB — PHOSPHORUS: Phosphorus: 2.8 mg/dL (ref 2.5–4.5)

## 2018-10-29 LAB — COMPLETE METABOLIC PANEL WITH GFR
AG Ratio: 1.7 (calc) (ref 1.0–2.5)
ALT: 13 U/L (ref 6–29)
AST: 13 U/L (ref 10–35)
Albumin: 4 g/dL (ref 3.6–5.1)
Alkaline phosphatase (APISO): 73 U/L (ref 37–153)
BUN: 12 mg/dL (ref 7–25)
CO2: 29 mmol/L (ref 20–32)
Calcium: 11.1 mg/dL — ABNORMAL HIGH (ref 8.6–10.4)
Chloride: 109 mmol/L (ref 98–110)
Creat: 0.8 mg/dL (ref 0.50–1.05)
GFR, Est African American: 99 mL/min/{1.73_m2} (ref >=60)
GFR, Est Non African American: 85 mL/min/{1.73_m2} (ref >=60)
Globulin: 2.4 g/dL (calc) (ref 1.9–3.7)
Glucose, Bld: 90 mg/dL (ref 65–99)
Potassium: 4.3 mmol/L (ref 3.5–5.3)
Sodium: 143 mmol/L (ref 135–146)
Total Bilirubin: 0.4 mg/dL (ref 0.2–1.2)
Total Protein: 6.4 g/dL (ref 6.1–8.1)

## 2018-10-29 LAB — IRON,TIBC AND FERRITIN PANEL
%SAT: 25 % (calc) (ref 16–45)
Ferritin: 101 ng/mL (ref 16–232)
Iron: 75 ug/dL (ref 45–160)
TIBC: 302 mcg/dL (calc) (ref 250–450)

## 2018-10-29 LAB — PTH, INTACT AND CALCIUM
Calcium: 11.1 mg/dL — ABNORMAL HIGH (ref 8.6–10.4)
PTH: 207 pg/mL — ABNORMAL HIGH (ref 14–64)

## 2018-10-29 LAB — MAGNESIUM: Magnesium: 2.1 mg/dL (ref 1.5–2.5)

## 2018-10-29 LAB — BRAIN NATRIURETIC PEPTIDE: Brain Natriuretic Peptide: 34 pg/mL (ref <100)

## 2018-10-29 LAB — VITAMIN D 25 HYDROXY (VIT D DEFICIENCY, FRACTURES): Vit D, 25-Hydroxy: 21 ng/mL — ABNORMAL LOW (ref 30–100)

## 2018-11-11 ENCOUNTER — Encounter: Payer: Self-pay | Admitting: Physician Assistant

## 2018-11-11 ENCOUNTER — Ambulatory Visit (INDEPENDENT_AMBULATORY_CARE_PROVIDER_SITE_OTHER): Payer: No Typology Code available for payment source | Admitting: Physician Assistant

## 2018-11-11 VITALS — BP 101/68 | HR 72 | Temp 98.1°F | Wt 286.0 lb

## 2018-11-11 DIAGNOSIS — I872 Venous insufficiency (chronic) (peripheral): Secondary | ICD-10-CM

## 2018-11-11 DIAGNOSIS — E559 Vitamin D deficiency, unspecified: Secondary | ICD-10-CM

## 2018-11-11 DIAGNOSIS — Z6841 Body Mass Index (BMI) 40.0 and over, adult: Secondary | ICD-10-CM

## 2018-11-11 DIAGNOSIS — R0609 Other forms of dyspnea: Secondary | ICD-10-CM

## 2018-11-11 DIAGNOSIS — Z7689 Persons encountering health services in other specified circumstances: Secondary | ICD-10-CM | POA: Insufficient documentation

## 2018-11-11 DIAGNOSIS — R609 Edema, unspecified: Secondary | ICD-10-CM

## 2018-11-11 DIAGNOSIS — R0600 Dyspnea, unspecified: Secondary | ICD-10-CM

## 2018-11-11 DIAGNOSIS — M25551 Pain in right hip: Secondary | ICD-10-CM

## 2018-11-11 MED ORDER — ERGOCALCIFEROL 1.25 MG (50000 UT) PO CAPS: 50000.0000 [IU] | ORAL_CAPSULE | ORAL | 0 refills | Status: AC

## 2018-11-11 MED ORDER — CELECOXIB 100 MG PO CAPS: ORAL_CAPSULE | ORAL | 2 refills | Status: DC

## 2018-11-11 MED ORDER — NALTREXONE-BUPROPION HCL ER 8-90 MG PO TB12: ORAL_TABLET | ORAL | 0 refills | Status: DC

## 2018-11-11 MED FILL — CELECOXIB 100 MG CAP: 100 | 30 days supply | Qty: 60 | Fill #0

## 2018-11-11 NOTE — Progress Notes (Signed)
HPI:                                                                Cynthia [name] is a 53 y.o. female who presents to [facility]: [place] today for edema/dyspnea follow-up  Patient with hx of venous insufficiency, lymphedema, hx of DVT LLE, anemia, hyperparathyroidism, obesity reports persistent cough for the last 6 months, that she states is progressively worsening. She describes "a hard,dry cough"associated with wheezing.Seems to be triggered by drinking liquids and anything pressing on her lower anterior neck such as car seat belt. She does endorse intermittent heartburn and indigestion for which she currently takes Omeprazole 40 mg QD. She was started on Symbicort bid for suspected reactive airway disease, but did not feel there was any improvement and found Ventolin was more effective, so this was discontinued.   She has noticed increased swelling and pitting edema in both of her legs (L>>R) worsened from baseline. She has a remote hx of LLE DVT and reports her left leg has remained chronically swollen compared to her right. Most recent US have shown venous insufficiency. Endorses intermittent dyspnea on mild exertion. Endorses weight gain. Home scale weight 283 today, 287 last week. Denies orthopnea/PND or exertional chest pain. Denies constitutional symptoms, sputum production, shortness of breath.   Endorses remote history of long-term secondhand smoke exposure as a child but denies any history of asthma or chronic lung disease. Reports family history of asthma in her mother  She has been using albuterol inhaler as needed and states that this does provide moderate relief of her symptoms She has never had an echo. She has taken lasix in the past, reports it made her feel dizzy/lightheaded. She wears compression socks at work and occ at home.   Prior work-up: 10/28/18 - CBC, CMP, BNP, iron, unremarkable 10/22/18 - negative  SARS-CoV-2 05/27/18 - negative chest x-ray 09/24/17 - [place] Lower Extremity - no DVT, abnormal reflux times on the left side     Interval hx 11/11/18 - she was started on HCTZ prn for peripheral edema. She has been cutting the dose in half and taking bid prn due urinary frequency. She has not had any HCTZ today - her echo is scheduled for tomorrow  Patient would like to discuss weight loss today. Current home weight 280 pounds Goal weight 245 pounds, size 18, last weighed this in 2016 Highest adult weight 387 pounds, last weighed this in 2010 prior to lap band procedure Reports she has never stayed the same weight for 10 years or more.  Has struggled with weight fluctuation for most of her adult life. She is very motivated to lose weight states, my cutoff is 260 pounds.  Beyond 260 I feel very uncomfortable."  Motivation includes improving her health, improving her mobility, and feeling better overall. Endorses meal skipping (breakfast), craving carbohydrates and sugar, frequent snacking, occasional binging, eating for reasons other than hunger, eating before going to bed. She is lightly active doing household chores and working.  She states her and her husband recently started walking every afternoon for 10 to 15 minutes. She states has been his taking Contrave and she is interested in also starting this medication. She has never taken prescription weight loss medication before. She  is currently participating in weight watchers and has been participating in the program for the last 3 weeks.  Past Medical History:  Diagnosis Date  . Chronic venous insufficiency 09/29/2017  . Deep venous thrombosis of lower extremity ([place]) 11/17/2015  . Depression   . History of DVT (deep vein thrombosis)   . Primary hyperparathyroidism ([place]) 11/28/2017  . Sleep difficulties    Past Surgical History:  Procedure Laterality Date  . COLONOSCOPY  2014  . GASTRIC RESTRICTION SURGERY     lap band  . HERNIA  REPAIR     Social History   Tobacco Use  . Smoking status: Never Smoker  . Smokeless tobacco: Never Used  Substance Use Topics  . Alcohol use: No   family history includes Alcohol abuse in her father; Heart attack in her father; Heart disease in her father; Hypertension in her mother; Prostate cancer in her father; Stroke in her mother; Uterine cancer in her maternal grandmother.    ROS: negative except as noted in the HPI  Medications: Current Outpatient Medications  Medication Sig Dispense Refill  . albuterol (VENTOLIN HFA) 108 (90 Base) MCG/ACT inhaler Inhale 2 puffs into the lungs every 4 (four) hours as needed for wheezing or shortness of breath (and cough x 3 days, then prn). 1 Inhaler 0  . AMBULATORY NON FORMULARY MEDICATION Knee-high, medium compression, graduated compression stockings. Apply to bilateral lower extremities daily 1 each 0  . aspirin EC 81 MG tablet Take 81 mg by mouth daily.    . celecoxib (CELEBREX) 100 MG capsule 1 tab PO bid or 2 tabs PO QD 60 capsule 2  . cyclobenzaprine (FLEXERIL) 10 MG tablet Take 1 tablet (10 mg total) by mouth at bedtime as needed for muscle spasms. 30 tablet 0  . Elastic Bandages & Supports (T.E.D. BELOW KNEE/X-LARGE) MISC Apply to lower extremities daily. Remove at bedtime 2 each 0  . ergocalciferol (VITAMIN D2) 1.25 MG (50000 UT) capsule Take 1 capsule (50,000 Units total) by mouth once a week for 4 doses. 4 capsule 0  . hydrochlorothiazide (HYDRODIURIL) 25 MG tablet Take 0.5 tablets (12.5 mg total) by mouth 2 (two) times daily as needed (swelling). 60 tablet 0  . linaclotide (LINZESS) 290 MCG CAPS capsule Take 290 mcg by mouth daily before breakfast.    . NONFORMULARY OR COMPOUNDED ITEM Thigh-high medium compression stockings. Wear daily. Remove at bed time 2 each 0  . NONFORMULARY OR COMPOUNDED ITEM Knee high medium compression stockings. Wear daily. Remove at bedtime 2 each 0  . vitamin B-12 (CYANOCOBALAMIN) 1000 MCG tablet Take  1,000 mcg by mouth daily.    Marland Kitchen zolpidem (AMBIEN) 5 MG tablet Take 5 mg by mouth at bedtime as needed. For sleep     . Naltrexone-buPROPion HCl ER 8-90 MG TB12 1 tab daily for week 1, then 1 tab BID for week 2, then 2 tab PO qAM and 1 tab PO qPM for week 3, then 2 tabs BID. 80 tablet 0  . omeprazole (PRILOSEC) 40 MG capsule Take 1 capsule (40 mg total) by mouth daily for 14 days. 30 capsule 0   No current facility-administered medications for this visit.    Allergies  Allergen Reactions  . Latex Rash    Where touched and will spread.  Marland Kitchen Penicillin G Itching       Objective:  BP 101/68   Pulse 72   Temp 98.1 F (36.7 C) (Oral)   Wt 286 lb (129.7 kg)   LMP 04/24/2014  SpO2 99%   BMI 46.16 kg/m  Gen:  alert, not ill-appearing, no distress, appropriate for age, obese female HEENT: head normocephalic without obvious abnormality, conjunctiva and cornea clear, trachea midline Pulm: Normal work of breathing, normal phonation Neuro: alert and oriented x 3, no tremor MSK: extremities atraumatic, normal gait and station, bilateral lower extremity edema left 3+ right 1+, distal pulses intact, no calf tenderness or palpable cords  Skin: intact, no rashes on exposed skin, no jaundice, no cyanosis Psych: well-groomed, cooperative, good eye contact, euthymic mood, affect mood-congruent, speech is articulate, and thought processes clear and goal-directed  BP Readings from Last 3 Encounters:  11/11/18 101/68  10/28/18 117/74  10/09/18 122/80   Wt Readings from Last 3 Encounters:  11/11/18 286 lb (129.7 kg)  10/28/18 281 lb 4 oz (127.6 kg)  10/23/18 288 lb (130.6 kg)     No results found for this or any previous visit (from the past 30 hour(s)). No results found.  Recent Results (from the past 2160 hour(s))  B Nat Peptide     Status: None   Collection Time: 10/28/18  1:42 PM  Result Value Ref Range   Brain Natriuretic Peptide 34 <100 pg/mL    Comment: . BNP levels increase with  age in the general population with the highest values seen in individuals greater than 53 years of age. Reference: J. Am. [place] Ar. Cardiol. 2002; 57:846-962. Marland Kitchen   VITAMIN D 25 Hydroxy (Vit-D Deficiency, Fractures)     Status: Abnormal   Collection Time: 10/28/18  1:42 PM  Result Value Ref Range   Vit D, 25-Hydroxy 21 (L) 30 - 100 ng/mL    Comment: Vitamin D Status         25-OH Vitamin D: . Deficiency:                    <20 ng/mL Insufficiency:             20 - 29 ng/mL Optimal:                 > or = 30 ng/mL . For 25-OH Vitamin D testing on patients on  D2-supplementation and patients for whom quantitation  of D2 and D3 fractions is required, the [facility](TM) 25-OH VIT D, (D2,D3), LC/MS/MS is recommended: order  code [phone] (patients >59yrs). See Note 1 . Note 1 . For additional information, please refer to  http://education.Quest[facility].com/faq/FAQ199  (This link is being provided for informational/ educational purposes only.)   PTH, Intact and Calcium     Status: Abnormal   Collection Time: 10/28/18  1:42 PM  Result Value Ref Range   PTH 207 (H) 14 - 64 pg/mL    Comment: . Interpretive Guide    Intact PTH           Calcium ------------------    ----------           ------- Normal Parathyroid    Normal               Normal Hypoparathyroidism    Low or Low Normal    Low Hyperparathyroidism    Primary            Normal or High       High    Secondary          High                 Normal or Low    Tertiary  High                 High Non-Parathyroid    Hypercalcemia      Low or Low Normal    High .    Calcium 11.1 (H) 8.6 - 10.4 mg/dL  Magnesium     Status: None   Collection Time: 10/28/18  1:42 PM  Result Value Ref Range   Magnesium 2.1 1.5 - 2.5 mg/dL  Phosphorus     Status: None   Collection Time: 10/28/18  1:42 PM  Result Value Ref Range   Phosphorus 2.8 2.5 - 4.5 mg/dL  COMPLETE METABOLIC PANEL WITH GFR     Status: Abnormal   Collection Time:  10/28/18  1:42 PM  Result Value Ref Range   Glucose, Bld 90 65 - 99 mg/dL    Comment: .            Fasting reference interval .    BUN 12 7 - 25 mg/dL   Creat 0.80 0.50 - 1.05 mg/dL    Comment: For patients >62 years of age, the reference limit for Creatinine is approximately 13% higher for people identified as African-American. .    GFR, Est Non African American 85 > OR = 60 mL/min/1.62m2   GFR, Est African American 99 > OR = 60 mL/min/1.78m2   BUN/Creatinine Ratio NOT APPLICABLE 6 - 22 (calc)   Sodium 143 135 - 146 mmol/L   Potassium 4.3 3.5 - 5.3 mmol/L   Chloride 109 98 - 110 mmol/L   CO2 29 20 - 32 mmol/L   Calcium 11.1 (H) 8.6 - 10.4 mg/dL   Total Protein 6.4 6.1 - 8.1 g/dL   Albumin 4.0 3.6 - 5.1 g/dL   Globulin 2.4 1.9 - 3.7 g/dL (calc)   AG Ratio 1.7 1.0 - 2.5 (calc)   Total Bilirubin 0.4 0.2 - 1.2 mg/dL   Alkaline phosphatase (APISO) 73 37 - 153 U/L   AST 13 10 - 35 U/L   ALT 13 6 - 29 U/L  Fe+TIBC+Fer     Status: None   Collection Time: 10/28/18  1:42 PM  Result Value Ref Range   Iron 75 45 - 160 mcg/dL   TIBC 302 250 - 450 mcg/dL (calc)   %SAT 25 16 - 45 % (calc)   Ferritin 101 16 - 232 ng/mL  Hemoglobin A1c     Status: None   Collection Time: 10/28/18  1:42 PM  Result Value Ref Range   Hgb A1c MFr Bld 5.4 <5.7 % of total Hgb    Comment: For the purpose of screening for the presence of diabetes: . <5.7%       Consistent with the absence of diabetes 5.7-6.4%    Consistent with increased risk for diabetes             (prediabetes) > or =6.5%  Consistent with diabetes . This assay result is consistent with a decreased risk of diabetes. . Currently, no consensus exists regarding use of hemoglobin A1c for diagnosis of diabetes in children. . According to American Diabetes Association (ADA) guidelines, hemoglobin A1c <7.0% represents optimal control in non-pregnant diabetic patients. Different metrics may apply to specific patient populations.   Standards of Medical Care in Diabetes(ADA). .    Mean Plasma Glucose 108 (calc)   eAG (mmol/L) 6.0 (calc)  CBC with Differential/Platelet     Status: Abnormal   Collection Time: 10/28/18  1:42 PM  Result Value Ref Range   WBC 3.9  3.8 - 10.8 Thousand/uL   RBC 5.01 3.80 - 5.10 Million/uL   Hemoglobin 12.9 11.7 - 15.5 g/dL   HCT 39.6 35.0 - 45.0 %   MCV 79.0 (L) 80.0 - 100.0 fL   MCH 25.7 (L) 27.0 - 33.0 pg   MCHC 32.6 32.0 - 36.0 g/dL   RDW 13.6 11.0 - 15.0 %   Platelets 209 140 - 400 Thousand/uL   MPV 10.9 7.5 - 12.5 fL   Neutro Abs 2,266 1,500 - 7,800 cells/uL   Lymphs Abs 1,049 850 - 3,900 cells/uL   Absolute Monocytes 312 200 - 950 cells/uL   Eosinophils Absolute 242 15 - 500 cells/uL   Basophils Absolute 31 0 - 200 cells/uL   Neutrophils Relative % 58.1 %   Total Lymphocyte 26.9 %   Monocytes Relative 8.0 %   Eosinophils Relative 6.2 %   Basophils Relative 0.8 %     Assessment and Plan: 53 y.o. female with   .Diagnoses and all orders for this visit:  Venous insufficiency of both lower extremities  Vitamin D insufficiency -     ergocalciferol (VITAMIN D2) 1.25 MG (50000 UT) capsule; Take 1 capsule (50,000 Units total) by mouth once a week for 4 doses.  Peripheral edema  Right hip pain -     celecoxib (CELEBREX) 100 MG capsule; 1 tab PO bid or 2 tabs PO QD  Class 3 severe obesity due to excess calories with serious comorbidity and body mass index (BMI) of 40.0 to 44.9 in adult (HCC) -     Naltrexone-buPROPion HCl ER 8-90 MG TB12; 1 tab daily for week 1, then 1 tab BID for week 2, then 2 tab PO qAM and 1 tab PO qPM for week 3, then 2 tabs BID.    Venous insufficiency Hx of intolerance with Lasix. Cont HCTZ 12.5 mg bid prn. Encouraged to continue wearing compression stockings daily  Chronic Cough, DOE Echo scheduled for tomorrow. BNP negative Referral to [facility] for chronic cough but patient has not been contacted to schedule. According to referral note they are  awaiting her negative Covid test result. Result abstracted today. Referral coordinator will reach out to Pulmonology to coordinate scheduling  Obesity Class 3 Patient counseled on weight loss today  Recommended 1600 calorie heart healthy diet, low-carb (30-35g/meal) Continue to gradually increase physical activity as tolerated with goal of walking 30 minutes daily Starting Contrave for appetite suppression  Patient education and anticipatory guidance given Patient agrees with treatment plan Follow-up in 4 weeks for weight check or sooner as needed if symptoms worsen or fail to improve  Darlyne Russian PA-C

## 2018-11-11 NOTE — Patient Instructions (Signed)

## 2018-11-12 ENCOUNTER — Ambulatory Visit (HOSPITAL_BASED_OUTPATIENT_CLINIC_OR_DEPARTMENT_OTHER)
Admission: RE | Admit: 2018-11-12 | Discharge: 2018-11-12 | Disposition: A | Discharge status: Home / Self Care | Payer: No Typology Code available for payment source | Source: Ambulatory Visit | Attending: Physician Assistant | Admitting: Physician Assistant

## 2018-11-12 ENCOUNTER — Telehealth: Payer: Self-pay | Admitting: Physician Assistant

## 2018-11-12 ENCOUNTER — Other Ambulatory Visit: Payer: Self-pay

## 2018-11-12 DIAGNOSIS — R0609 Other forms of dyspnea: Secondary | ICD-10-CM | POA: Insufficient documentation

## 2018-11-12 DIAGNOSIS — R0600 Dyspnea, unspecified: Secondary | ICD-10-CM

## 2018-11-12 NOTE — Telephone Encounter (Signed)
Received fax from [employer] that Contrave requires a PA. Information has been sent to the insurance company. Awaiting determination.

## 2018-11-12 NOTE — Progress Notes (Signed)
  Echocardiogram 2D Echocardiogram has been performed.  Cynthia [name] 11/12/2018, 1:28 PM

## 2018-11-14 NOTE — Telephone Encounter (Signed)
Insurance faxed form requesting additional information. Waiting on a response.

## 2018-11-17 MED FILL — CONTRAVE ER 8-90 MG TABLET: 8-90 | 30 days supply | Qty: 80 | Fill #0

## 2018-11-17 NOTE — Telephone Encounter (Signed)
Received fax from [employer] that Cynthia [name] was approved from 12/11/2018 through 03/12/2019. Pharmacy notified and forms sent to scan.   Reference ID: [id number].

## 2018-11-18 ENCOUNTER — Other Ambulatory Visit: Payer: Self-pay | Admitting: Surgery

## 2018-11-18 ENCOUNTER — Other Ambulatory Visit (HOSPITAL_COMMUNITY): Payer: Self-pay | Admitting: Surgery

## 2018-11-18 DIAGNOSIS — K9509 Other complications of gastric band procedure: Secondary | ICD-10-CM

## 2018-11-19 ENCOUNTER — Encounter: Payer: Self-pay | Admitting: *Deleted

## 2018-11-22 ENCOUNTER — Encounter: Payer: Self-pay | Admitting: Physician Assistant

## 2018-11-26 MED FILL — CELECOXIB 200 MG CAP: 200 | 30 days supply | Qty: 60 | Fill #2

## 2018-11-28 ENCOUNTER — Other Ambulatory Visit: Payer: Self-pay

## 2018-11-28 ENCOUNTER — Ambulatory Visit (HOSPITAL_COMMUNITY)
Admission: RE | Admit: 2018-11-28 | Discharge: 2018-11-28 | Disposition: A | Discharge status: Home / Self Care | Payer: No Typology Code available for payment source | Source: Ambulatory Visit | Attending: Surgery | Admitting: Surgery

## 2018-11-28 ENCOUNTER — Other Ambulatory Visit (HOSPITAL_COMMUNITY): Payer: Self-pay | Admitting: Surgery

## 2018-11-28 DIAGNOSIS — K9509 Other complications of gastric band procedure: Secondary | ICD-10-CM

## 2018-12-10 ENCOUNTER — Encounter: Payer: No Typology Code available for payment source | Attending: Surgery | Admitting: Skilled Nursing Facility1

## 2018-12-10 ENCOUNTER — Other Ambulatory Visit: Payer: Self-pay

## 2018-12-10 DIAGNOSIS — Z6841 Body Mass Index (BMI) 40.0 and over, adult: Secondary | ICD-10-CM | POA: Insufficient documentation

## 2018-12-10 NOTE — Progress Notes (Signed)
Pre-Op Assessment Visit:  Pre-Operative Sleeve Gastrectomy Surgery  Medical Nutrition Therapy:  Appt start time: 11:25 End time:  12:15  Patient was seen on 12/10/2018 for Pre-Operative Nutrition Assessment. Assessment and letter of approval faxed to [facility] Bariatric Surgery Program coordinator on 12/10/2018.    Referral Stated SW[facility] Appointments Required: 0  Proposed Surgery Type: Band (2010) to Sleeve  Pt expectation of surgery: to lose weight  Pt expectation of dietitian: none   NUTRITION ASSESSMENT   Anthropometrics  Start weight at [facility]: 282.8 lbs Today's weight: 282.8 lbs BMI: 45.65  kg/m2     Psychosocial/Lifestyle Dx: Lap band 2010, GERD (before lap band as well), chronic constipation  Pt state she loves weight watchers due to the accountability. Pt states her constipation has progressively gotten worse in the past 5 years. Pt states she works 3rd shift. Pt states she has just started some biking. Pt states she works 7 on 7 off. Pt states she skips meals at work bu does not when she is off. Pt states her husband fishes so they eat a lot of fish.    Medications: See list  Labs:   Notable Signs/Symptoms constipation   24-Hr Dietary Recall First Meal: fritata and oysters or eggs and bacon Snack: cookies Second Meal: salad with chicken or fish Snack: chips Third Meal: salad with chicken or fish Snack:  Beverages: soda, tea, water with flavorings     Physical Activity  Som biking    Estimated Energy Needs Calories: 1600 Carbohydrate: 180 Protein: 120 Fat: 44   NUTRITION DIAGNOSIS  Overweight/obesity ([place]-3.3) related to past poor dietary habits and physical inactivity as evidenced by patient w/ planned Sleeve surgery following dietary guidelines for continued weight loss.    NUTRITION INTERVENTION  Nutrition counseling (C-1) and education (E-2) to facilitate bariatric surgery goals.   Handouts given during visit include:  . Pre-Op  Goals . Bariatric Surgery Protein Shakes . Vitamin and Mineral Options    During the appointment today the following Pre-Op Goals were reviewed with the patient: . Log your food and beverage via an app or pen and paper  . Make healthy food choices . Begin to limit portion sizes . Limited concentrated sugars and fried foods . Keep fat/sugar in the single digits per serving on              food labels . Practice CHEWING your food  (aim for 30 chews per bite or until applesauce consistency) . Practice not drinking 15 minutes before, during, and 30 minutes after each meal/snack . Avoid all carbonated beverages  . Avoid/limit caffeinated beverages  . Avoid all sugar-sweetened beverages . Consume 3 meals per day; eat every 3-5 hours . Make a list of non-food related activities . Aim for 64-100 ounces of FLUID daily  . Aim for at least 60-80 grams of PROTEIN daily . Look for a liquid protein source that contain ?15 g protein and ?5 g carbohydrate  (ex: shakes, drinks, shots)   Change readiness: contemplative   Demonstrated degree of understanding via: Teach Back      MONITORING & EVALUATION Dietary intake, weekly physical activity, body weight, and pre-op goals reached.    Next Steps  Patient is to call [facility] (once surgery date is scheduled) to be scheduled for Pre-Op Class, which must be at least 2 weeks prior to surgery date or back follow up visit in 1 month

## 2018-12-12 ENCOUNTER — Encounter: Payer: Self-pay | Admitting: Physician Assistant

## 2018-12-29 ENCOUNTER — Ambulatory Visit (INDEPENDENT_AMBULATORY_CARE_PROVIDER_SITE_OTHER): Payer: No Typology Code available for payment source | Admitting: Professional

## 2018-12-29 DIAGNOSIS — F509 Eating disorder, unspecified: Secondary | ICD-10-CM

## 2019-01-05 ENCOUNTER — Other Ambulatory Visit: Payer: Self-pay

## 2019-01-07 ENCOUNTER — Other Ambulatory Visit: Payer: Self-pay

## 2019-01-07 ENCOUNTER — Ambulatory Visit: Payer: No Typology Code available for payment source | Admitting: Internal Medicine

## 2019-01-07 ENCOUNTER — Encounter: Payer: Self-pay | Admitting: Internal Medicine

## 2019-01-07 VITALS — BP 112/70 | HR 61 | Ht 65.5 in | Wt 287.0 lb

## 2019-01-07 DIAGNOSIS — E559 Vitamin D deficiency, unspecified: Secondary | ICD-10-CM | POA: Diagnosis not present

## 2019-01-07 DIAGNOSIS — E213 Hyperparathyroidism, unspecified: Secondary | ICD-10-CM | POA: Diagnosis not present

## 2019-01-07 MED ORDER — FUROSEMIDE 20 MG PO TABS: 10.0000 mg | ORAL_TABLET | Freq: Every day | ORAL | 5 refills | Status: DC

## 2019-01-07 MED FILL — FUROSEMIDE 20 MG TABS: 20 | 60 days supply | Qty: 30 | Fill #0

## 2019-01-07 NOTE — Patient Instructions (Signed)
Please increase vitamin D to 3000 units daily.  Stop HCTZ and start Lasix 10 mg daily (can increase to 2x a day if tolerated well).  Come back in 2 months for labs.  Please come back for a follow-up appointment in 4 months.

## 2019-01-07 NOTE — Progress Notes (Signed)
Patient ID: [name], female   DOB: 17-Mar-1966, 53 y.o.   MRN: [id number]    HPI  Cynthia [name] is a 53 y.o.-year-old female returning for follow-up for hypercalcemia/hyperparathyroidism.  Last visit 10 months ago.  Pt was dx with hypercalcemia in 2012 and she was found to have a very high PTH in 11/2017.   Reviewed pertinent labs: Lab Results  Component Value Date   PTH 207 (H) 10/28/2018   PTH 316 (H) 11/27/2017   CALCIUM 11.1 (H) 10/28/2018   CALCIUM 11.1 (H) 10/28/2018   CALCIUM 11.6 (H) 11/27/2017   CALCIUM 10.9 (H) 11/11/2017   CALCIUM 10.7 (H) 10/26/2010   CALCIUM 10.6 (H) 10/19/2010   CALCIUM 10.5 01/27/2009   No fractures or falls.  No history of osteoporosis.  She had one kidney stone episode in 2018.  No history of CKD Lab Results  Component Value Date   BUN 12 10/28/2018   BUN 17 11/11/2017   CREATININE 0.80 10/28/2018   CREATININE 0.85 11/11/2017   She is not on HCTZ.  She has a history of vitamin D deficiency: Lab Results  Component Value Date   VD25OH 21 (L) 10/28/2018   VD25OH 17.47 (L) 02/26/2018   At last visit she was on 1000 units vitamin D daily from multivitamins.  I advised her to increase this to 3000 units by adding another 2000 units vitamin D daily. She did not increase, but is taking a total of 1000 units daily now.  Of note, she is on PPI.  After we increase the dose of vitamin D I advised her to come back for another recheck in 2 months, but she did not return.  She denies a FH of hypercalcemia, pituitary tumors, ?thyroid cancer, MEN sd's, or osteoporosis.  Kidney stones in both parents  Pt. also has a history of LE lymphedema L>R.  She started to go to lymphedema clinic in [place].  The clinic in [place] is only caring for cancer patients.  Pt has a h/o lap band 2010 >> initially lost approximately 100 pounds, then gained 30 back after she got married. She maight have a gastric sleeve done .  No history of  thyrotoxicosis. Lab Results  Component Value Date   TSH 1.90 02/26/2018    ROS: Constitutional: + weight gain/no weight loss, no fatigue, no subjective hyperthermia, no subjective hypothermia Eyes: no blurry vision, no xerophthalmia ENT: no sore throat, no nodules palpated in neck, no dysphagia, no odynophagia, no hoarseness Cardiovascular: no CP/no SOB/no palpitations/+ leg swelling Respiratory: no cough/no SOB/no wheezing Gastrointestinal: no N/no V/no D/less C/no acid reflux Musculoskeletal: no muscle aches/no joint aches Skin: no rashes, no hair loss Neurological: no tremors/no numbness/no tingling/no dizziness  I reviewed pt's medications, allergies, PMH, social hx, family hx, and changes were documented in the history of present illness. Otherwise, unchanged from my initial visit note.  Past Medical History:  Diagnosis Date  . Chronic venous insufficiency 09/29/2017  . Deep venous thrombosis of lower extremity ([place]) 11/17/2015  . Depression   . History of DVT (deep vein thrombosis)   . Primary hyperparathyroidism ([place]) 11/28/2017  . Sleep difficulties    Past Surgical History:  Procedure Laterality Date  . COLONOSCOPY  2014  . GASTRIC RESTRICTION SURGERY     lap band  . HERNIA REPAIR     Social History   Socioeconomic History  . Marital status: Married    Spouse name: Not on file  . Number of children: 2  .  Years of education: Not on file  . Highest education level: Not on file  Occupational History  .  Radiology tech  Tobacco Use  . Smoking status: Never Smoker  . Smokeless tobacco: Never Used  Substance and Sexual Activity  . Alcohol use:  Yes, socially, 4-5 times yearly, liquor/wine  . Drug use: No  . Sexual activity: Yes    Birth control/protection: Post-menopausal   Current Outpatient Medications on File Prior to Visit  Medication Sig Dispense Refill  . albuterol (VENTOLIN HFA) 108 (90 Base) MCG/ACT inhaler Inhale 2 puffs into the lungs every 4 (four)  hours as needed for wheezing or shortness of breath (and cough x 3 days, then prn). 1 Inhaler 0  . AMBULATORY NON FORMULARY MEDICATION Knee-high, medium compression, graduated compression stockings. Apply to bilateral lower extremities daily 1 each 0  . aspirin EC 81 MG tablet Take 81 mg by mouth daily.    . celecoxib (CELEBREX) 100 MG capsule 1 tab PO bid or 2 tabs PO QD 60 capsule 2  . cyclobenzaprine (FLEXERIL) 10 MG tablet Take 1 tablet (10 mg total) by mouth at bedtime as needed for muscle spasms. 30 tablet 0  . Elastic Bandages & Supports (T.E.D. BELOW KNEE/X-LARGE) MISC Apply to lower extremities daily. Remove at bedtime 2 each 0  . hydrochlorothiazide (HYDRODIURIL) 25 MG tablet Take 0.5 tablets (12.5 mg total) by mouth 2 (two) times daily as needed (swelling). 60 tablet 0  . linaclotide (LINZESS) 290 MCG CAPS capsule Take 290 mcg by mouth daily before breakfast.    . Naltrexone-buPROPion HCl ER 8-90 MG TB12 1 tab daily for week 1, then 1 tab BID for week 2, then 2 tab PO qAM and 1 tab PO qPM for week 3, then 2 tabs BID. 80 tablet 0  . NONFORMULARY OR COMPOUNDED ITEM Thigh-high medium compression stockings. Wear daily. Remove at bed time 2 each 0  . NONFORMULARY OR COMPOUNDED ITEM Knee high medium compression stockings. Wear daily. Remove at bedtime 2 each 0  . omeprazole (PRILOSEC) 40 MG capsule Take 1 capsule (40 mg total) by mouth daily for 14 days. 30 capsule 0  . vitamin B-12 (CYANOCOBALAMIN) 1000 MCG tablet Take 1,000 mcg by mouth daily.    Marland Kitchen zolpidem (AMBIEN) 5 MG tablet Take 5 mg by mouth at bedtime as needed. For sleep      No current facility-administered medications on file prior to visit.    Allergies  Allergen Reactions  . Latex Rash    Where touched and will spread.  Marland Kitchen Penicillin G Itching   Family History  Problem Relation Age of Onset  . Stroke Mother   . Hypertension Mother   . Heart disease Father   . Heart attack Father   . Alcohol abuse Father   . Prostate  cancer Father   . Uterine cancer Maternal Grandmother     PE: BP 112/70   Pulse 61   Ht 5' 5.5" (1.664 m) Comment: measured without shoes  Wt 287 lb (130.2 kg)   LMP 04/24/2014   BMI 47.03 kg/m  Wt Readings from Last 3 Encounters:  01/07/19 287 lb (130.2 kg)  12/10/18 282 lb 12.8 oz (128.3 kg)  11/11/18 286 lb (129.7 kg)   Constitutional: overweight, in NAD Eyes: PERRLA, EOMI, no exophthalmos ENT: moist mucous membranes, no thyromegaly, no cervical lymphadenopathy Cardiovascular: RRR, No MRG, + L>R lower extremity edema Respiratory: CTA B Gastrointestinal: abdomen soft, NT, ND, BS+ Musculoskeletal: no deformities, strength intact in all  4 Skin: moist, warm, no rashes Neurological: no tremor with outstretched hands, DTR normal in all 4  Assessment: 1. Hypercalcemia/hyperparathyroidism  2.  Vitamin D deficiency  Plan: Patient with several instances of elevated calcium, with the highest level being 11.6.  The corresponding intact PTH level was also high, at 316.  I first saw the patient 10 months ago and at that time her vitamin D level was low, at 17.47.  I increased her vitamin D supplement and advised her to return for labs to further investigate her hyperparathyroidism but she did not do so.  However, she  had labs with PCP 2 months ago and her vitamin D level was still low, only slightly improved, at 21.  Her intact PTH ([facility]) was still high at 207.  Calcium was also high at 11.1. -She does have a history of nephrolithiasis but no history of osteoporosis or fractures.  No abdominal pain, depression, bone pain.  She does have chronic constipation. -At last visit, we discussed about physiology of calcium and parathyroid hormone and at this visit we again discussed about possible side effects from increased PTH, including kidney stones and osteoporosis long-term -We also discussed that hyperparathyroidism can be secondary to another condition (for example vitamin D deficiency,  CKD, calcium malabsorption, hypercalciuria) or it can be primary (familial hypercalcemic hypercalciuria or parathyroid adenoma).  In her case, we need to improve her vitamin D level to normal to be able to further delineate between the 2. -At this visit, she tells me that she is taking only 1000 units vitamin D daily (misunderstood instruction at last OV)... Will increase to 3000 units daily -We will repeat her vitamin D in 2 mo.  If this is normal, I will have her back for parathyroid labs: calcium level intact PTH ([facility]) Magnesium Phosphorus Calcitriol 24h urinary calcium/creatinine ratio - given instructions for urine collection - If the tests indicate a parathyroid adenoma she agrees with referral to surgery  - Criteria for parathyroid surgery are (she meets the ones in bold):  Marland Kitchen Increased cal calcium >1 mg/dL . Osteoporosis (or vertebral fracture) . Age <52 years old . High UCa >400 mg/d and increased stone risk by biochemical stone risk analysis . Presence of nephrolithiasis or nephrocalcinosis . Pt's preference!  -We may need to check a DEXA scan to see if she has osteoporosis + add a 33% distal radius for evaluation of cortical bone, which is predominantly affected by hyperparathyroidism - I will see the patient back in 4 months  2.  Vitamin D deficiency -Her latest vitamin D level was only slightly higher than at last visit, at 21.  At last visit, it was 17.5. -since she is still taking 1000 units vitamin D daily, will increase the supplement dose to 3000 units daily -We will recheck a vitamin D level in 2 mo  Philemon Kingdom, MD PhD [facility] Endocrinology

## 2019-01-12 ENCOUNTER — Ambulatory Visit: Payer: Self-pay | Admitting: Professional

## 2019-01-16 ENCOUNTER — Ambulatory Visit (INDEPENDENT_AMBULATORY_CARE_PROVIDER_SITE_OTHER): Payer: No Typology Code available for payment source | Admitting: Professional

## 2019-01-16 ENCOUNTER — Encounter: Payer: Self-pay | Admitting: Physician Assistant

## 2019-01-16 DIAGNOSIS — F509 Eating disorder, unspecified: Secondary | ICD-10-CM | POA: Diagnosis not present

## 2019-02-02 ENCOUNTER — Ambulatory Visit: Payer: No Typology Code available for payment source | Admitting: Professional

## 2019-03-02 ENCOUNTER — Other Ambulatory Visit: Payer: Self-pay

## 2019-03-02 ENCOUNTER — Encounter: Payer: Self-pay | Admitting: Pulmonary Disease

## 2019-03-02 ENCOUNTER — Ambulatory Visit: Payer: No Typology Code available for payment source | Admitting: Pulmonary Disease

## 2019-03-02 ENCOUNTER — Encounter: Payer: Self-pay | Admitting: Sports Medicine

## 2019-03-02 VITALS — BP 118/70 | HR 78 | Temp 97.6°F | Ht 66.0 in | Wt 295.6 lb

## 2019-03-02 DIAGNOSIS — M25551 Pain in right hip: Secondary | ICD-10-CM

## 2019-03-02 DIAGNOSIS — R05 Cough: Secondary | ICD-10-CM | POA: Diagnosis not present

## 2019-03-02 DIAGNOSIS — R059 Cough, unspecified: Secondary | ICD-10-CM

## 2019-03-02 MED ORDER — CELECOXIB 100 MG PO CAPS: ORAL_CAPSULE | ORAL | 2 refills | Status: DC

## 2019-03-02 MED FILL — CELECOXIB 100 MG CAPS: 100 | 30 days supply | Qty: 60 | Fill #0

## 2019-03-02 NOTE — Progress Notes (Signed)
Subjective:    Patient ID: Cynthia [name], female    DOB: 1965/11/05, 53 y.o.   MRN: [id number]  Patient with chronic cough Since about February  Albuterol does help occasionally Use Symbicort for about a month-did not see any difference  Intermittent symptoms of reflux -PPI did not really help -Has not been taking any PPIs recently  History of LAP-BAND surgery over 10 years ago -On course to have them removed  Had a respiratory illness in December, cough started in February Cough is intermittent, nonproductive No other associated symptoms Occurs randomly with no predilection for any time of the day  Never smoker Exposed to secondhand smoke as a child  No occupational predisposition No hobbies that places her at risk  Denies any nasal stuffiness or congestion She does have seasonal allergies  Mother does have asthma  Past Medical History:  Diagnosis Date  . Chronic venous insufficiency 09/29/2017  . Deep venous thrombosis of lower extremity ([place]) 11/17/2015  . Depression   . History of DVT (deep vein thrombosis)   . Primary hyperparathyroidism ([place]) 11/28/2017  . Sleep difficulties    Social History   Socioeconomic History  . Marital status: Married    Spouse name: Not on file  . Number of children: Not on file  . Years of education: Not on file  . Highest education level: Not on file  Occupational History  . Not on file  Social Needs  . Financial resource strain: Not on file  . Food insecurity    Worry: Not on file    Inability: Not on file  . Transportation needs    Medical: Not on file    Non-medical: Not on file  Tobacco Use  . Smoking status: Never Smoker  . Smokeless tobacco: Never Used  Substance and Sexual Activity  . Alcohol use: No  . Drug use: No  . Sexual activity: Yes    Birth control/protection: Post-menopausal  Lifestyle  . Physical activity    Days per week: Not on file    Minutes per session: Not on file  . Stress: Not on  file  Relationships  . Social Herbalist on phone: Not on file    Gets together: Not on file    Attends religious service: Not on file    Active member of club or organization: Not on file    Attends meetings of clubs or organizations: Not on file    Relationship status: Not on file  . Intimate partner violence    Fear of current or ex partner: Not on file    Emotionally abused: Not on file    Physically abused: Not on file    Forced sexual activity: Not on file  Other Topics Concern  . Not on file  Social History Narrative  . Not on file   Family History  Problem Relation Age of Onset  . Stroke Mother   . Hypertension Mother   . Heart disease Father   . Heart attack Father   . Alcohol abuse Father   . Prostate cancer Father   . Uterine cancer Maternal Grandmother     Review of Systems  Respiratory: Positive for cough.       Objective:   Physical Exam Constitutional:      General: She is not in acute distress.    Appearance: She is obese.  HENT:     Head: Normocephalic and atraumatic.     Nose: No congestion.  Mouth/Throat:     Mouth: Mucous membranes are dry.  Eyes:     General:        Right eye: No discharge.        Left eye: No discharge.     Pupils: Pupils are equal, round, and reactive to light.  Neck:     Musculoskeletal: Normal range of motion.  Cardiovascular:     Rate and Rhythm: Normal rate and regular rhythm.  Pulmonary:     Effort: Pulmonary effort is normal. No respiratory distress.     Breath sounds: Normal breath sounds. No stridor. No wheezing or rhonchi.  Musculoskeletal:        General: Swelling present.  Neurological:     Mental Status: She is alert.    Vitals:   03/02/19 0857 03/02/19 0858  BP:  118/70  Pulse:  78  Temp: 97.6 F (36.4 C)   SpO2:  98%   Chest x-ray July 10-within normal limits-reviewed by myself Echocardiogram 11/12/2018-within normal limits     Assessment & [name]:  .  Intermittent cough -Did  not start with a respiratory infection -No other associated symptoms -Steroid lab documentation did not help in the past -Reflux medications did not help in the past  .  Intermittent reflux -PPI in the past did not help -Currently not taking any PPI -Feels she is not having any significant symptoms currently  .  Occasional wheezing -May be related to hyperactivity -Possibly of asthma -Work-up will include a pulmonary function test: Methacholine challenge may be beneficial  .  Obesity -Continues with weight loss efforts   [name] will be pulmonary function test with methacholine challenge To continue using albuterol as needed-current use will be about once every other week -Reluctant to go on steroid/LABA combination long-term  I will see her back in the office in about 3 months

## 2019-03-02 NOTE — Patient Instructions (Signed)
Chronic intermittent cough  Commonest causes will be exposure to triggers, nasal stuffiness/congestion, asthma-airway hyperactivity or reflux -Medications to alleviate airway responsiveness to above may help  Continue with albuterol use We will get a breathing study with methacholine challenge  I will see back in the office tentatively in about 3 months

## 2019-03-07 ENCOUNTER — Other Ambulatory Visit (HOSPITAL_COMMUNITY)
Admission: RE | Admit: 2019-03-07 | Discharge: 2019-03-07 | Disposition: A | Discharge status: Home / Self Care | Payer: No Typology Code available for payment source | Source: Ambulatory Visit | Attending: Pulmonary Disease | Admitting: Pulmonary Disease

## 2019-03-07 DIAGNOSIS — Z20828 Contact with and (suspected) exposure to other viral communicable diseases: Secondary | ICD-10-CM | POA: Insufficient documentation

## 2019-03-07 DIAGNOSIS — Z01812 Encounter for preprocedural laboratory examination: Secondary | ICD-10-CM | POA: Insufficient documentation

## 2019-03-08 LAB — NOVEL CORONAVIRUS, NAA (HOSP ORDER, SEND-OUT TO REF LAB; TAT 18-24 HRS): SARS-CoV-2, NAA: NOT DETECTED

## 2019-03-09 ENCOUNTER — Other Ambulatory Visit: Payer: Self-pay | Admitting: Internal Medicine

## 2019-03-09 DIAGNOSIS — E559 Vitamin D deficiency, unspecified: Secondary | ICD-10-CM

## 2019-03-11 ENCOUNTER — Ambulatory Visit (HOSPITAL_COMMUNITY)
Admission: RE | Admit: 2019-03-11 | Discharge: 2019-03-11 | Disposition: A | Discharge status: Home / Self Care | Payer: No Typology Code available for payment source | Source: Ambulatory Visit | Attending: Pulmonary Disease | Admitting: Pulmonary Disease

## 2019-03-11 ENCOUNTER — Other Ambulatory Visit (INDEPENDENT_AMBULATORY_CARE_PROVIDER_SITE_OTHER): Payer: No Typology Code available for payment source

## 2019-03-11 ENCOUNTER — Other Ambulatory Visit: Payer: Self-pay

## 2019-03-11 DIAGNOSIS — R05 Cough: Secondary | ICD-10-CM | POA: Insufficient documentation

## 2019-03-11 DIAGNOSIS — R059 Cough, unspecified: Secondary | ICD-10-CM

## 2019-03-11 DIAGNOSIS — E559 Vitamin D deficiency, unspecified: Secondary | ICD-10-CM | POA: Diagnosis not present

## 2019-03-11 LAB — PULMONARY FUNCTION TEST
DL/VA % pred: 125 %
DL/VA: 5.28 ml/min/mmHg/L
DLCO unc % pred: 91 %
DLCO unc: 20.38 ml/min/mmHg
FEF 25-75 Post: 3.21 L/sec
FEF 25-75 Pre: 3.34 L/sec
FEF2575-%Change-Post: -3 %
FEF2575-%Pred-Post: 127 %
FEF2575-%Pred-Pre: 132 %
FEV1-%Change-Post: 0 %
FEV1-%Pred-Post: 93 %
FEV1-%Pred-Pre: 94 %
FEV1-Post: 2.3 L
FEV1-Pre: 2.32 L
FEV1FVC-%Change-Post: 4 %
FEV1FVC-%Pred-Pre: 109 %
FEV6-%Change-Post: -5 %
FEV6-%Pred-Post: 82 %
FEV6-%Pred-Pre: 87 %
FEV6-Post: 2.49 L
FEV6-Pre: 2.62 L
FEV6FVC-%Pred-Post: 102 %
FEV6FVC-%Pred-Pre: 102 %
FVC-%Change-Post: -5 %
FVC-%Pred-Post: 80 %
FVC-%Pred-Pre: 85 %
FVC-Post: 2.49 L
FVC-Pre: 2.62 L
Post FEV1/FVC ratio: 93 %
Post FEV6/FVC ratio: 100 %
Pre FEV1/FVC ratio: 89 %
Pre FEV6/FVC Ratio: 100 %
RV % pred: 91 %
RV: 1.75 L
TLC % pred: 82 %
TLC: 4.43 L

## 2019-03-11 LAB — VITAMIN D 25 HYDROXY (VIT D DEFICIENCY, FRACTURES): VITD: 23.14 ng/mL — ABNORMAL LOW (ref 30.00–100.00)

## 2019-03-11 MED ORDER — METHACHOLINE 4 MG/ML NEB SOLN
2.0000 mL | Freq: Once | RESPIRATORY_TRACT | Status: DC
  Filled 2019-03-11: qty 2

## 2019-03-11 MED ORDER — METHACHOLINE 16 MG/ML NEB SOLN
2.0000 mL | Freq: Once | RESPIRATORY_TRACT | Status: DC
  Filled 2019-03-11: qty 2

## 2019-03-11 MED ORDER — METHACHOLINE 0.25 MG/ML NEB SOLN
2.0000 mL | Freq: Once | RESPIRATORY_TRACT | Status: DC
  Filled 2019-03-11: qty 2

## 2019-03-11 MED ORDER — ALBUTEROL SULFATE (2.5 MG/3ML) 0.083% IN NEBU: 2.5000 mg | INHALATION_SOLUTION | Freq: Once | RESPIRATORY_TRACT | Status: DC

## 2019-03-11 MED ORDER — METHACHOLINE 0.0625 MG/ML NEB SOLN
2.0000 mL | Freq: Once | RESPIRATORY_TRACT | Status: DC
  Filled 2019-03-11 (×3): qty 3

## 2019-03-11 MED ORDER — METHACHOLINE 1 MG/ML NEB SOLN
2.0000 mL | Freq: Once | RESPIRATORY_TRACT | Status: DC
  Filled 2019-03-11: qty 2

## 2019-03-11 MED ORDER — ALBUTEROL SULFATE (2.5 MG/3ML) 0.083% IN NEBU
2.5000 mg | INHALATION_SOLUTION | Freq: Once | RESPIRATORY_TRACT | Status: AC
  Administered 2019-03-11: 11:00:00 2.5 mg via RESPIRATORY_TRACT

## 2019-03-11 MED ORDER — SODIUM CHLORIDE 0.9 % IN NEBU
3.0000 mL | INHALATION_SOLUTION | Freq: Once | RESPIRATORY_TRACT | Status: DC
  Filled 2019-03-11: qty 3

## 2019-03-18 DIAGNOSIS — F329 Major depressive disorder, single episode, unspecified: Secondary | ICD-10-CM | POA: Insufficient documentation

## 2019-03-18 DIAGNOSIS — F32A Depression, unspecified: Secondary | ICD-10-CM | POA: Insufficient documentation

## 2019-03-19 LAB — HM PAP SMEAR: HM Pap smear: NEGATIVE

## 2019-04-25 ENCOUNTER — Inpatient Hospital Stay (HOSPITAL_COMMUNITY): Admission: RE | Admit: 2019-04-25 | Payer: No Typology Code available for payment source | Source: Ambulatory Visit

## 2019-04-30 ENCOUNTER — Encounter: Payer: Self-pay | Admitting: Internal Medicine

## 2019-04-30 ENCOUNTER — Other Ambulatory Visit: Payer: Self-pay

## 2019-04-30 ENCOUNTER — Other Ambulatory Visit (INDEPENDENT_AMBULATORY_CARE_PROVIDER_SITE_OTHER): Payer: No Typology Code available for payment source

## 2019-04-30 ENCOUNTER — Ambulatory Visit: Payer: No Typology Code available for payment source | Admitting: Internal Medicine

## 2019-04-30 VITALS — BP 120/70 | HR 65 | Ht 65.5 in | Wt 292.0 lb

## 2019-04-30 DIAGNOSIS — E559 Vitamin D deficiency, unspecified: Secondary | ICD-10-CM

## 2019-04-30 DIAGNOSIS — E213 Hyperparathyroidism, unspecified: Secondary | ICD-10-CM | POA: Diagnosis not present

## 2019-04-30 NOTE — Progress Notes (Signed)
Patient ID: Cynthia [name], female   DOB: 07-Apr-1966, 53 y.o.   MRN: [id number]   This visit occurred during the SARS-CoV-2 public health emergency.  Safety protocols were in place, including screening questions prior to the visit, additional usage of staff PPE, and extensive cleaning of exam room while observing appropriate contact time as indicated for disinfecting solutions.   HPI  [place] is a 53 y.o.-year-old female returning for follow-up for hypercalcemia/hyperparathyroidism.  Last visit 4 months ago.  Pt was dx with hypercalcemia in 2012 and she was found to have a very high PTH in 11/2017.  This was confirmed in 10/2018.   Reviewed pertinent labs: Lab Results  Component Value Date   PTH 207 (H) 10/28/2018   PTH 316 (H) 11/27/2017   CALCIUM 11.1 (H) 10/28/2018   CALCIUM 11.1 (H) 10/28/2018   CALCIUM 11.6 (H) 11/27/2017   CALCIUM 10.9 (H) 11/11/2017   CALCIUM 10.7 (H) 10/26/2010   CALCIUM 10.6 (H) 10/19/2010   CALCIUM 10.5 01/27/2009   No fractures or falls and no history of osteoporosis.  She had one kidney stone episode in 2018.  No history of CKD: Lab Results  Component Value Date   BUN 12 10/28/2018   BUN 17 11/11/2017   CREATININE 0.80 10/28/2018   CREATININE 0.85 11/11/2017   She is not on HCTZ now-we switched from this to Lasix 10 mg daily in the past.  She has a history of vitamin D deficiency: Lab Results  Component Value Date   VD25OH 23.14 (L) 03/11/2019   VD25OH 21 (L) 10/28/2018   VD25OH 17.47 (L) 02/26/2018   In 02/2018: She was on 1000 units vitamin D daily from multivitamins.  I advised her to increase this to 3000 units by adding another 2000 units vitamin D daily.  However, at last visit, she was still taking 1000 units daily.  I advised her to increase to 3000 units since her vitamin D level was still low.  In 02/2019, we increased to 5000 units. Of note, she is on PPIs, which may affect the absorption of vitamin D.  She denies  a FH of hypercalcemia, pituitary tumors, ?thyroid cancer, MEN sd's, or osteoporosis.  + Kidney stones in both parents.  Pt. also has a history of LE lymphedema L>R.  She started to go to lymphedema clinic in [place].  The clinic in [place] is only caring for cancer patients.  Pt has a h/o lap band 2010 >> initially lost approximately 100 pounds, then gained 30 back after she got married.  She may have a gastric sleeve done in the future.  No history of thyrotoxicosis: Lab Results  Component Value Date   TSH 1.90 02/26/2018   ROS: Constitutional: no weight gain/no weight loss, no fatigue, no subjective hyperthermia, no subjective hypothermia Eyes: no blurry vision, no xerophthalmia ENT: no sore throat, no nodules palpated in neck, no dysphagia, no odynophagia, no hoarseness Cardiovascular: no CP/no SOB/no palpitations/no leg swelling Respiratory: no cough/no SOB/no wheezing Gastrointestinal: no N/no V/no D/++ C/no acid reflux Musculoskeletal: no muscle aches/no joint aches Skin: no rashes, no hair loss Neurological: no tremors/no numbness/no tingling/no dizziness  I reviewed pt's medications, allergies, PMH, social hx, family hx, and changes were documented in the history of present illness. Otherwise, unchanged from my initial visit note.  Past Medical History:  Diagnosis Date  . Chronic venous insufficiency 09/29/2017  . Deep venous thrombosis of lower extremity ([place]) 11/17/2015  . Depression   . History of DVT (deep  vein thrombosis)   . Primary hyperparathyroidism ([place]) 11/28/2017  . Sleep difficulties    Past Surgical History:  Procedure Laterality Date  . COLONOSCOPY  2014  . GASTRIC RESTRICTION SURGERY     lap band  . HERNIA REPAIR     Social History   Socioeconomic History  . Marital status: Married    Spouse name: Not on file  . Number of children: 2  . Years of education: Not on file  . Highest education level: Not on file  Occupational History  .   Radiology tech  Tobacco Use  . Smoking status: Never Smoker  . Smokeless tobacco: Never Used  Substance and Sexual Activity  . Alcohol use:  Yes, socially, 4-5 times yearly, liquor/wine  . Drug use: No  . Sexual activity: Yes    Birth control/protection: Post-menopausal   Current Outpatient Medications on File Prior to Visit  Medication Sig Dispense Refill  . albuterol (VENTOLIN HFA) 108 (90 Base) MCG/ACT inhaler Inhale 2 puffs into the lungs every 4 (four) hours as needed for wheezing or shortness of breath (and cough x 3 days, then prn). 1 Inhaler 0  . AMBULATORY NON FORMULARY MEDICATION Knee-high, medium compression, graduated compression stockings. Apply to bilateral lower extremities daily 1 each 0  . aspirin EC 81 MG tablet Take 81 mg by mouth daily.    . celecoxib (CELEBREX) 100 MG capsule 1 tab PO bid or 2 tabs PO QD 60 capsule 2  . Cholecalciferol (VITAMIN D) 125 MCG (5000 UT) CAPS Take 1 capsule by mouth daily.     . cyclobenzaprine (FLEXERIL) 10 MG tablet Take 1 tablet (10 mg total) by mouth at bedtime as needed for muscle spasms. 30 tablet 0  . Elastic Bandages & Supports (T.E.D. BELOW KNEE/X-LARGE) MISC Apply to lower extremities daily. Remove at bedtime 2 each 0  . furosemide (LASIX) 20 MG tablet Take 0.5 tablets (10 mg total) by mouth daily. 30 tablet 5  . NONFORMULARY OR COMPOUNDED ITEM Thigh-high medium compression stockings. Wear daily. Remove at bed time 2 each 0  . NONFORMULARY OR COMPOUNDED ITEM Knee high medium compression stockings. Wear daily. Remove at bedtime 2 each 0  . vitamin B-12 (CYANOCOBALAMIN) 1000 MCG tablet Take 1,000 mcg by mouth daily.    Marland Kitchen zolpidem (AMBIEN) 5 MG tablet Take 5 mg by mouth at bedtime as needed. For sleep      No current facility-administered medications on file prior to visit.   Allergies  Allergen Reactions  . Latex Rash    Where touched and will spread.  Marland Kitchen Penicillin G Itching   Family History  Problem Relation Age of Onset  .  Stroke Mother   . Hypertension Mother   . Heart disease Father   . Heart attack Father   . Alcohol abuse Father   . Prostate cancer Father   . Uterine cancer Maternal Grandmother     PE: BP 120/70   Pulse 65   Ht 5' 5.5" (1.664 m)   Wt 292 lb (132.5 kg)   LMP 04/24/2014   SpO2 98%   BMI 47.85 kg/m  Wt Readings from Last 3 Encounters:  04/30/19 292 lb (132.5 kg)  03/02/19 295 lb 9.6 oz (134.1 kg)  01/07/19 287 lb (130.2 kg)   Constitutional: overweight, in NAD Eyes: PERRLA, EOMI, no exophthalmos ENT: moist mucous membranes, no thyromegaly, no cervical lymphadenopathy Cardiovascular: RRR, No MRG, + LE edema Respiratory: CTA B Gastrointestinal: abdomen soft, NT, ND, BS+ Musculoskeletal: no deformities,  strength intact in all 4 Skin: moist, warm, no rashes Neurological: no tremor with outstretched hands, DTR normal in all 4  Assessment: 1. Hypercalcemia/hyperparathyroidism  2.  Vitamin D deficiency  [name]: Patient with several instances of elevated calcium, with the highest level being 11.6.  The corresponding intact PTH level was also high, at 316.  Subsequent intact PTH ([facility]) was still high at 207 for a calcium of 11.1.   -WHen I first saw the patient her vitamin D level was also low, 17.47.  We increase her vitamin D supplementation but unfortunately subsequent vitamin D levels did not increase above 23 (02/2018).  We increased the dose to 5000 units then. -She does have a history of nephrolithiasis but no history of osteoporosis or fractures, no abdominal pain, depression, bone pain.  She does have chronic worsening constipation. -We discussed at previous visit about possible side effects from increased PTH and hypercalcemia to include kidney stones and osteoporosis long-term -We also discussed that hyperparathyroidism can be secondary to another condition (for example vitamin D deficiency, CKD, calcium malabsorption, hypercalciuria) or it can be primary (familial  hypercalcemic hypercalciuria or parathyroid adenoma).  In her case, we need to improve her vitamin D level to normal or at least close to normal to be able to further delineate between the 2. -When her vitamin D normalizes, we will need to check the following labs: Calcium Intact PTH-[facility] Magnesium Phosphorus Calcitriol 24-hour urine calcium -If the tests indicate a parathyroid adenoma she agrees with the referral to surgery -She meets criteria for surgery (bold::  . Increased calcium >1 mg/dL . Osteoporosis (or vertebral fracture) . Age <32 years old . High UCa >400 mg/d and increased stone risk by biochemical stone risk analysis . Presence of nephrolithiasis or nephrocalcinosis . Pt's preference!  -We may need to check a DXA scan to see if she has osteoporosis and 33% distal radius reevaluation of cortical bone, which is predominantly affected by hyperparathyroidism - I will see the patient back in 4 months  2.  Vitamin D deficiency -Latest vitamin D level was only slightly higher, at 21.  Previously 17.5. -She had another vitamin D level 2 months ago and this was only slightly higher, at 23. -At last visit she was still taking 1000 units vitamin D daily and we increased it to 3000 units daily.  We increased to 5000 units 2 mo ago. She continues on this dose now -We will recheck her vitamin D level today  Component     Latest Ref Rng & Units 04/30/2019  VITD     30.00 - 100.00 ng/mL 22.85 (L)  Vitamin D level is still too low.  I will recheck with her and she is taking the 5000 units every single day, and if so, I would suggest to increase 10,000 units alternating with 5,000 units every other day and come back for labs in 1.5 months.  Philemon Kingdom, MD PhD [facility] Endocrinology

## 2019-04-30 NOTE — Patient Instructions (Addendum)
Please continue vitamin D 5000 units daily.  Please stop at [facility] lab.  Please come back for a follow-up appointment in 4 months.

## 2019-05-01 LAB — VITAMIN D 25 HYDROXY (VIT D DEFICIENCY, FRACTURES): VITD: 22.85 ng/mL — ABNORMAL LOW (ref 30.00–100.00)

## 2019-05-11 ENCOUNTER — Encounter: Payer: No Typology Code available for payment source | Attending: Surgery | Admitting: Skilled Nursing Facility1

## 2019-05-11 ENCOUNTER — Other Ambulatory Visit: Payer: Self-pay

## 2019-05-11 DIAGNOSIS — Z6841 Body Mass Index (BMI) 40.0 and over, adult: Secondary | ICD-10-CM | POA: Insufficient documentation

## 2019-05-11 NOTE — Progress Notes (Signed)
Pre-Operative Nutrition Class:  Appt start time: 0017   End time:  1830.  Patient was seen on 05/11/2019 for Pre-Operative Bariatric Surgery Education at the [facility].   Surgery date:  Surgery type: sleeve Start weight at [facility]: 282.8 Weight today: 291.7  Samples given per MNT protocol. Patient educated on appropriate usage: procare Multivitamin Lot # [id number] Exp: 05/22   The following the learning objectives were met by the patient during this course:  Identify Pre-Op Dietary Goals and will begin 2 weeks pre-operatively  Identify appropriate sources of fluids and proteins   State protein recommendations and appropriate sources pre and post-operatively  Identify Post-Operative Dietary Goals and will follow for 2 weeks post-operatively  Identify appropriate multivitamin and calcium sources  Describe the need for physical activity post-operatively and will follow MD recommendations  State when to call healthcare provider regarding medication questions or post-operative complications  Handouts given during class include:  Pre-Op Bariatric Surgery Diet Handout  Protein Shake Handout  Post-Op Bariatric Surgery Nutrition Handout  BELT Program Information Flyer  Support Group Information Flyer  [facility] Bariatric Supplements Price List  Follow-Up Plan: Patient will follow-up at [facility] 2 weeks post operatively for diet advancement per MD.

## 2019-05-21 MED FILL — CELECOXIB 100 MG CAPS: 100 | 30 days supply | Qty: 60 | Fill #1

## 2019-06-08 ENCOUNTER — Encounter: Payer: Self-pay | Admitting: Physician Assistant

## 2019-08-06 ENCOUNTER — Ambulatory Visit (INDEPENDENT_AMBULATORY_CARE_PROVIDER_SITE_OTHER): Payer: No Typology Code available for payment source | Admitting: Nurse Practitioner

## 2019-08-06 ENCOUNTER — Encounter: Payer: Self-pay | Admitting: Nurse Practitioner

## 2019-08-06 ENCOUNTER — Other Ambulatory Visit: Payer: Self-pay

## 2019-08-06 VITALS — BP 116/75 | HR 79 | Wt 293.1 lb

## 2019-08-06 DIAGNOSIS — R52 Pain, unspecified: Secondary | ICD-10-CM | POA: Diagnosis not present

## 2019-08-06 DIAGNOSIS — R609 Edema, unspecified: Secondary | ICD-10-CM

## 2019-08-06 MED ORDER — NONFORMULARY OR COMPOUNDED ITEM: 0 refills | Status: DC

## 2019-08-06 MED ORDER — GABAPENTIN 300 MG PO CAPS: 300.0000 mg | ORAL_CAPSULE | Freq: Three times a day (TID) | ORAL | 3 refills | Status: DC

## 2019-08-06 MED FILL — GABAPENTIN 300 MG CAPSULE: 300 | 30 days supply | Qty: 90 | Fill #0

## 2019-08-06 NOTE — Patient Instructions (Signed)
Pain Without a Known Cause Pain can occur in any part of the body and can range from mild to severe. Sometimes no cause can be found for why you are having pain. Some types of pain that can occur without a known cause include:  Headache.  Back pain.  Abdominal pain.  Neck pain. Your health care provider will do tests to try to find the cause of your pain. If no cause is found, your health care provider may diagnose you with pain without a known cause. In some cases, your health care provider may repeat tests and look further for a possible cause. Follow these instructions at home: Managing pain, stiffness, and swelling      Take over-the-counter and prescription medicines only as told by your health care provider.  Do not drive or use heavy machinery while taking prescription pain medicine.  Stop any activities that cause pain. Rest during periods of severe pain.  If directed, put ice on the painful area: ? Put ice in a plastic bag. ? Place a towel between your skin and the bag. ? Leave the ice on for 20 minutes, 2-3 times a day.  If directed, apply heat to the affected area. Use the heat source that your health care provider recommends, such as a moist heat pack or a heating pad. ? Place a towel between your skin and the heat source. ? Leave the heat on for 20-30 minutes. ? Remove the heat if your skin turns bright red. This is especially important if you are unable to feel pain, heat, or cold. You may have a greater risk of getting burned. General instructions   Reduce your stress with activities such as yoga or meditation. Talk with your health care provider about other ways to reduce stress.  Exercise regularly. Ask your health care provider what activities are safe for you.  Eat a balanced diet that includes fruits and vegetables, whole grains, lean meat, and low-fat dairy. Talk with your health care provider if you have any questions about your diet.  If you are taking  prescription pain medicine, take actions to prevent or treat constipation. Your health care provider may recommend that you: ? Drink enough fluid to keep your urine pale yellow. ? Eat foods that are high in fiber, such as fresh fruits and vegetables, whole grains, and beans. ? Limit foods that are high in fat and processed sugars, such as fried and sweet foods. ? Take an over-the-counter or prescription medicine for constipation. Contact a health care provider if you:  Have pain, and no reason can be found for it.  Do not get better, even after treatment. Get help right away if:  Your pain is making you want to harm yourself. If you ever feel like you may hurt yourself or others, or have thoughts about taking your own life, get help right away. You can go to your nearest emergency department or call:  Your local emergency services (911 in the [place]  A suicide crisis helpline, such as the [employer] at (229) 179-2724. This is open 24 hours a day. Summary  Pain can occur in any part of the body and can range from mild to severe.  Your health care provider will do tests to try to find the cause of your pain. If no cause is found, your health care provider may diagnose you with pain without a known cause.  To help your pain, take medicines as told by your health care provider,  apply ice or heat, exercise, reduce stress, and eat a healthy diet. This information is not intended to replace advice given to you by your health care provider. Make sure you discuss any questions you have with your health care provider. Document Revised: 07/03/2018 Document Reviewed: 05/27/2017   Myofascial Pain Syndrome and Fibromyalgia Myofascial pain syndrome and fibromyalgia are both pain disorders. This pain may be felt mainly in your muscles.  Myofascial pain syndrome: ? Always has tender points in the muscle that will cause pain when pressed (trigger points). The pain may come  and go. ? Usually affects your neck, upper back, and shoulder areas. The pain often radiates into your arms and hands.  Fibromyalgia: ? Has muscle pains and tenderness that come and go. ? Is often associated with fatigue and sleep problems. ? Has trigger points. ? Tends to be long-lasting (chronic), but is not life-threatening. Fibromyalgia and myofascial pain syndrome are not the same. However, they often occur together. If you have both conditions, each can make the other worse. Both are common and can cause enough pain and fatigue to make day-to-day activities difficult. Both can be hard to diagnose because their symptoms are common in many other conditions. What are the causes? The exact causes of these conditions are not known. What increases the risk? You are more likely to develop this condition if:  You have a family history of the condition.  You have certain triggers, such as: ? Spine disorders. ? An injury (trauma) or other physical stressors. ? Being under a lot of stress. ? Medical conditions such as osteoarthritis, rheumatoid arthritis, or lupus. What are the signs or symptoms? Fibromyalgia The main symptom of fibromyalgia is widespread pain and tenderness in your muscles. Pain is sometimes described as stabbing, shooting, or burning. You may also have:  Tingling or numbness.  Sleep problems and fatigue.  Problems with attention and concentration (fibro fog). Other symptoms may include:  Bowel and bladder problems.  Headaches.  Visual problems.  Problems with odors and noises.  Depression or mood changes.  Painful menstrual periods (dysmenorrhea).  Dry skin or eyes. These symptoms can vary over time. Myofascial pain syndrome Symptoms of myofascial pain syndrome include:  Tight, ropy bands of muscle.  Uncomfortable sensations in muscle areas. These may include aching, cramping, burning, numbness, tingling, and weakness.  Difficulty moving certain  parts of the body freely (poor range of motion). How is this diagnosed? This condition may be diagnosed by your symptoms and medical history. You will also have a physical exam. In general:  Fibromyalgia is diagnosed if you have pain, fatigue, and other symptoms for more than 3 months, and symptoms cannot be explained by another condition.  Myofascial pain syndrome is diagnosed if you have trigger points in your muscles, and those trigger points are tender and cause pain elsewhere in your body (referred pain). How is this treated? Treatment for these conditions depends on the type that you have.  For fibromyalgia: ? Pain medicines, such as NSAIDs. ? Medicines for treating depression. ? Medicines for treating seizures. ? Medicines that relax the muscles.  For myofascial pain: ? Pain medicines, such as NSAIDs. ? Cooling and stretching of muscles. ? Trigger point injections. ? Sound wave (ultrasound) treatments to stimulate muscles. Treating these conditions often requires a team of health care providers. These may include:  Your primary care provider.  Physical therapist.  Complementary health care providers, such as massage therapists or acupuncturists.  Psychiatrist for cognitive behavioral therapy. Follow  these instructions at home: Medicines  Take over-the-counter and prescription medicines only as told by your health care provider.  Do not drive or use heavy machinery while taking prescription pain medicine.  If you are taking prescription pain medicine, take actions to prevent or treat constipation. Your health care provider may recommend that you: ? Drink enough fluid to keep your urine pale yellow. ? Eat foods that are high in fiber, such as fresh fruits and vegetables, whole grains, and beans. ? Limit foods that are high in fat and processed sugars, such as fried or sweet foods. ? Take an over-the-counter or prescription medicine for  constipation. Lifestyle   Exercise as directed by your health care provider or physical therapist.  Practice relaxation techniques to control your stress. You may want to try: ? Biofeedback. ? Visual imagery. ? Hypnosis. ? Muscle relaxation. ? Yoga. ? Meditation.  Maintain a healthy lifestyle. This includes eating a healthy diet and getting enough sleep.  Do not use any products that contain nicotine or tobacco, such as cigarettes and e-cigarettes. If you need help quitting, ask your health care provider. General instructions  Talk to your health care provider about complementary treatments, such as acupuncture or massage.  Consider joining a support group with others who are diagnosed with this condition.  Do not do activities that stress or strain your muscles. This includes repetitive motions and heavy lifting.  Keep all follow-up visits as told by your health care provider. This is important. Where to find more information  National Fibromyalgia Association: www.fmaware.[place]: www.arthritis.org  American Chronic Pain Association: www.theacpa.org Contact a health care provider if:  You have new symptoms.  Your symptoms get worse or your pain is severe.  You have side effects from your medicines.  You have trouble sleeping.  Your condition is causing depression or anxiety. Summary  Myofascial pain syndrome and fibromyalgia are pain disorders.  Myofascial pain syndrome has tender points in the muscle that will cause pain when pressed (trigger points). Fibromyalgia also has muscle pains and tenderness that come and go, but this condition is often associated with fatigue and sleep disturbances.  Fibromyalgia and myofascial pain syndrome are not the same but often occur together, causing pain and fatigue that make day-to-day activities difficult.  Treatment for fibromyalgia includes taking medicines to relax the muscles and medicines for pain,  depression, or seizures. Treatment for myofascial pain syndrome includes taking medicines for pain, cooling and stretching of muscles, and injecting medicines into trigger points.  Follow your health care provider's instructions for taking medicines and maintaining a healthy lifestyle. This information is not intended to replace advice given to you by your health care provider. Make sure you discuss any questions you have with your health care provider. Document Revised: 08/29/2018 Document Reviewed: 05/22/2017 Elsevier Patient Education  2020 [place] Patient Education  [employer].

## 2019-08-06 NOTE — Progress Notes (Signed)
Acute Office Visit  Subjective:    Patient ID: Cynthia [name], female    DOB: 18-Jan-1966, 54 y.o.   MRN: [id number]  CC: Body aches  HPI Patient is in today for body aches that started about 6 months ago and have progressively worsened. She reports her lower extremities, hips, and buttocks are significantly worse with deep, penetrating pain at a 7-8/10. Her upper extremities are more of an aching pain. She does experience joint tenderness and pain in addition to muscular pain and tenderness.  She does take Celebrex daily but does not feel this is helpful for this type of pain.  She has also tried Flexeril however she feels this just makes her sleepy.  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  She also reports difficulty sleeping and inability to get comfortable specifically in her lower extremities.  She denies any known pinpoint areas of pain.  She does not have any known family history of autoimmune disease.  She does experience some peripheral edema in her lower extremities for which she wears compression stockings while working.   Past Medical History:  Diagnosis Date  . Chronic venous insufficiency 09/29/2017  . Deep venous thrombosis of lower extremity ([place]) 11/17/2015  . Depression   . History of DVT (deep vein thrombosis)   . Primary hyperparathyroidism ([place]) 11/28/2017  . Sleep difficulties     Past Surgical History:  Procedure Laterality Date  . COLONOSCOPY  2014  . GASTRIC RESTRICTION SURGERY     lap band  . HERNIA REPAIR      Family History  Problem Relation Age of Onset  . Stroke Mother   . Hypertension Mother   . Heart disease Father   . Heart attack Father   . Alcohol abuse Father   . Prostate cancer Father   . Uterine cancer Maternal Grandmother     Social History   Socioeconomic History  . Marital status: Married    Spouse name: Not  on file  . Number of children: Not on file  . Years of education: Not on file  . Highest education level: Not on file  Occupational History  . Not on file  Tobacco Use  . Smoking status: Never Smoker  . Smokeless tobacco: Never Used  Substance and Sexual Activity  . Alcohol use: No  . Drug use: No  . Sexual activity: Yes    Birth control/protection: Post-menopausal  Other Topics Concern  . Not on file  Social History Narrative  . Not on file   Social Determinants of Health   Financial Resource Strain:   . Difficulty of Paying Living Expenses:   Food Insecurity:   . Worried About Charity fundraiser in the Last Year:   . Arboriculturist in the Last Year:   Transportation Needs:   . Film/video editor (Medical):   Marland Kitchen Lack of Transportation (Non-Medical):   Physical Activity:   . Days of Exercise per Week:   . Minutes of Exercise per Session:   Stress:   . Feeling of Stress :   Social Connections:   . Frequency of Communication with Friends and Family:   . Frequency of Social Gatherings with Friends and Family:   . Attends Religious Services:   . Active Member of Clubs or Organizations:   . Attends Archivist Meetings:   Marland Kitchen Marital Status:  Intimate Partner Violence:   . Fear of Current or Ex-Partner:   . Emotionally Abused:   Marland Kitchen Physically Abused:   . Sexually Abused:     Outpatient Medications Prior to Visit  Medication Sig Dispense Refill  . albuterol (VENTOLIN HFA) 108 (90 Base) MCG/ACT inhaler Inhale 2 puffs into the lungs every 4 (four) hours as needed for wheezing or shortness of breath (and cough x 3 days, then prn). 1 Inhaler 0  . AMBULATORY NON FORMULARY MEDICATION Knee-high, medium compression, graduated compression stockings. Apply to bilateral lower extremities daily 1 each 0  . aspirin EC 81 MG tablet Take 81 mg by mouth daily.    . celecoxib (CELEBREX) 100 MG capsule 1 tab PO bid or 2 tabs PO QD 60 capsule 2  . Cholecalciferol (VITAMIN D)  125 MCG (5000 UT) CAPS Take 1 capsule by mouth daily.     . cyclobenzaprine (FLEXERIL) 10 MG tablet Take 1 tablet (10 mg total) by mouth at bedtime as needed for muscle spasms. 30 tablet 0  . Elastic Bandages & Supports (T.E.D. BELOW KNEE/X-LARGE) MISC Apply to lower extremities daily. Remove at bedtime 2 each 0  . NONFORMULARY OR COMPOUNDED ITEM Thigh-high medium compression stockings. Wear daily. Remove at bed time 2 each 0  . NONFORMULARY OR COMPOUNDED ITEM Knee high medium compression stockings. Wear daily. Remove at bedtime 2 each 0  . vitamin B-12 (CYANOCOBALAMIN) 1000 MCG tablet Take 1,000 mcg by mouth daily.    Marland Kitchen zolpidem (AMBIEN) 5 MG tablet Take 5 mg by mouth at bedtime as needed. For sleep     . furosemide (LASIX) 20 MG tablet Take 0.5 tablets (10 mg total) by mouth daily. 30 tablet 5   No facility-administered medications prior to visit.    Allergies  Allergen Reactions  . Latex Rash    Where touched and will spread.  Marland Kitchen Penicillin G Itching    Review of Systems  Constitutional: Positive for fatigue. Negative for chills and fever.  Respiratory: Negative for cough, chest tightness and shortness of breath.   Cardiovascular: Positive for leg swelling. Negative for chest pain and palpitations.  Gastrointestinal: Negative for abdominal pain.  Genitourinary: Negative for flank pain.  Musculoskeletal: Positive for arthralgias, back pain and myalgias.  Skin: Negative for color change, pallor and rash.  Neurological: Negative for tremors, weakness, numbness and headaches.  Psychiatric/Behavioral: Positive for sleep disturbance.       Objective:    Physical Exam Vitals and nursing note reviewed.  Constitutional:      Appearance: Normal appearance.  Neck:     Vascular: No carotid bruit.  Cardiovascular:     Rate and Rhythm: Normal rate and regular rhythm.     Pulses: Normal pulses.     Heart sounds: Normal heart sounds.  Pulmonary:     Effort: Pulmonary effort is normal.      Breath sounds: Normal breath sounds.  Abdominal:     General: Bowel sounds are normal.     Palpations: Abdomen is soft.     Tenderness: There is no abdominal tenderness.  Musculoskeletal:        General: Swelling and tenderness present.     Cervical back: Normal range of motion. No tenderness.     Right lower leg: Edema present.     Left lower leg: Edema present.  Skin:    General: Skin is warm and dry.     Capillary Refill: Capillary refill takes less than 2 seconds.     Coloration:  Skin is not jaundiced.     Findings: No erythema, lesion or rash.  Neurological:     General: No focal deficit present.     Mental Status: She is alert and oriented to person, place, and time.     Sensory: No sensory deficit.     Motor: No weakness.     Coordination: Coordination normal.     Gait: Gait normal.  Psychiatric:        Mood and Affect: Mood normal.        Behavior: Behavior normal.        Thought Content: Thought content normal.        Judgment: Judgment normal.     BP 116/75 (BP Location: Left Arm, Patient Position: Sitting, Cuff Size: Large)   Pulse 79   Wt 293 lb 1.9 oz (133 kg)   LMP 04/24/2014   BMI 48.04 kg/m  Wt Readings from Last 3 Encounters:  08/06/19 293 lb 1.9 oz (133 kg)  05/11/19 291 lb 11.2 oz (132.3 kg)  04/30/19 292 lb (132.5 kg)    There are no preventive care reminders to display for this patient.  There are no preventive care reminders to display for this patient.   Lab Results  Component Value Date   TSH 1.90 02/26/2018   Lab Results  Component Value Date   WBC 3.9 10/28/2018   HGB 12.9 10/28/2018   HCT 39.6 10/28/2018   MCV 79.0 (L) 10/28/2018   PLT 209 10/28/2018   Lab Results  Component Value Date   NA 143 10/28/2018   K 4.3 10/28/2018   CO2 29 10/28/2018   GLUCOSE 90 10/28/2018   BUN 12 10/28/2018   CREATININE 0.80 10/28/2018   BILITOT 0.4 10/28/2018   ALKPHOS 51 10/26/2010   AST 13 10/28/2018   ALT 13 10/28/2018   PROT 6.4  10/28/2018   ALBUMIN 3.1 (L) 10/26/2010   CALCIUM 11.1 (H) 10/28/2018   CALCIUM 11.1 (H) 10/28/2018   Lab Results  Component Value Date   CHOL 140 11/11/2017   Lab Results  Component Value Date   HDL 35 (L) 11/11/2017   Lab Results  Component Value Date   LDLCALC 86 11/11/2017   Lab Results  Component Value Date   TRIG 95 11/11/2017   Lab Results  Component Value Date   CHOLHDL 4.0 11/11/2017   Lab Results  Component Value Date   HGBA1C 5.4 10/28/2018       Assessment & [name]:   1. Pain, generalized Generalized pain lower extremities worse than the upper extremities with unknown etiology.  Will obtain labs today. Review of the history reveals primary para hyperthyroidism. Patient to continue Celebrex daily and can take Tylenol as needed to help with pain.   Prescription for gabapentin provided to see if this will help provide some relief, specifically at bedtime. We will follow up with patient and determine next course of action once labs have been reviewed. - CBC with Differential/Platelet - Sed Rate (ESR) - TSH + free T4 - COMPLETE METABOLIC PANEL WITH GFR - ANA - CK  2. Peripheral edema Peripheral edema of lower extremities exacerbated by 12-hour shifts.  Compression stockings have been effective in the past to help with edema and pain. Prescription provided. - NONFORMULARY OR COMPOUNDED ITEM; Knee high medium compression stockings. Wear daily. Remove at bedtime  Dispense: 2 each; Refill: 0   Orma Render, NP

## 2019-08-12 LAB — COMPLETE METABOLIC PANEL WITH GFR
AG Ratio: 1.7 (calc) (ref 1.0–2.5)
ALT: 18 U/L (ref 6–29)
AST: 16 U/L (ref 10–35)
Albumin: 4.2 g/dL (ref 3.6–5.1)
Alkaline phosphatase (APISO): 84 U/L (ref 37–153)
BUN: 14 mg/dL (ref 7–25)
CO2: 27 mmol/L (ref 20–32)
Calcium: 11.8 mg/dL — ABNORMAL HIGH (ref 8.6–10.4)
Chloride: 109 mmol/L (ref 98–110)
Creat: 0.83 mg/dL (ref 0.50–1.05)
GFR, Est African American: 94 mL/min/{1.73_m2} (ref >=60)
GFR, Est Non African American: 81 mL/min/{1.73_m2} (ref >=60)
Globulin: 2.5 g/dL (calc) (ref 1.9–3.7)
Glucose, Bld: 82 mg/dL (ref 65–99)
Potassium: 4.3 mmol/L (ref 3.5–5.3)
Sodium: 144 mmol/L (ref 135–146)
Total Bilirubin: 0.3 mg/dL (ref 0.2–1.2)
Total Protein: 6.7 g/dL (ref 6.1–8.1)

## 2019-08-12 LAB — CBC WITH DIFFERENTIAL/PLATELET
Absolute Monocytes: 458 cells/uL (ref 200–950)
Basophils Absolute: 42 cells/uL (ref 0–200)
Basophils Relative: 0.8 %
Eosinophils Absolute: 276 cells/uL (ref 15–500)
Eosinophils Relative: 5.3 %
HCT: 41.1 % (ref 35.0–45.0)
Hemoglobin: 13.1 g/dL (ref 11.7–15.5)
Lymphs Abs: 1378 cells/uL (ref 850–3900)
MCH: 25.4 pg — ABNORMAL LOW (ref 27.0–33.0)
MCHC: 31.9 g/dL — ABNORMAL LOW (ref 32.0–36.0)
MCV: 79.8 fL — ABNORMAL LOW (ref 80.0–100.0)
MPV: 11.3 fL (ref 7.5–12.5)
Monocytes Relative: 8.8 %
Neutro Abs: 3047 cells/uL (ref 1500–7800)
Neutrophils Relative %: 58.6 %
Platelets: 220 10*3/uL (ref 140–400)
RBC: 5.15 10*6/uL — ABNORMAL HIGH (ref 3.80–5.10)
RDW: 13.2 % (ref 11.0–15.0)
Total Lymphocyte: 26.5 %
WBC: 5.2 10*3/uL (ref 3.8–10.8)

## 2019-08-12 LAB — TSH+FREE T4: TSH W/REFLEX TO FT4: 2.73 mIU/L

## 2019-08-12 LAB — CK: Total CK: 98 U/L (ref 29–143)

## 2019-08-12 LAB — ANA: Anti Nuclear Antibody (ANA): NEGATIVE

## 2019-08-12 LAB — SEDIMENTATION RATE: Sed Rate: 11 mm/h (ref 0–30)

## 2019-08-27 ENCOUNTER — Encounter: Payer: Self-pay | Admitting: Nurse Practitioner

## 2019-08-27 NOTE — Telephone Encounter (Signed)
Handicap Placard form placed in provider's box for completion.

## 2019-09-03 ENCOUNTER — Encounter: Payer: Self-pay | Admitting: Internal Medicine

## 2019-09-03 ENCOUNTER — Other Ambulatory Visit: Payer: Self-pay

## 2019-09-03 ENCOUNTER — Ambulatory Visit: Payer: No Typology Code available for payment source | Admitting: Internal Medicine

## 2019-09-03 VITALS — BP 120/70 | HR 62 | Ht 65.5 in | Wt 296.0 lb

## 2019-09-03 DIAGNOSIS — E213 Hyperparathyroidism, unspecified: Secondary | ICD-10-CM | POA: Diagnosis not present

## 2019-09-03 DIAGNOSIS — E0789 Other specified disorders of thyroid: Secondary | ICD-10-CM

## 2019-09-03 DIAGNOSIS — E559 Vitamin D deficiency, unspecified: Secondary | ICD-10-CM | POA: Diagnosis not present

## 2019-09-03 DIAGNOSIS — E041 Nontoxic single thyroid nodule: Secondary | ICD-10-CM

## 2019-09-03 NOTE — Progress Notes (Addendum)
Patient ID: [name], female   DOB: 07-26-1965, 54 y.o.   MRN: [id number]   This visit occurred during the SARS-CoV-2 public health emergency.  Safety protocols were in place, including screening questions prior to the visit, additional usage of staff PPE, and extensive cleaning of exam room while observing appropriate contact time as indicated for disinfecting solutions.   HPI  Cynthia [name] is a 54 y.o.-year-old female returning for follow-up for hypercalcemia/hyperparathyroidism and vitamin D deficiency.  Last visit 4 months ago.  She was diagnosed with hypercalcemia in 2012 and she was found to have a very high PTH in 11/2017.  This was confirmed in 10/2018.  Since then, we have been trying to improve her vitamin D level to further investigate and treat her hyperparathyroidism, however, without success.  She continues to have significant constipation and  feeling poorly overall, with generalized pains  Reviewed pertinent labs: Lab Results  Component Value Date   PTH 207 (H) 10/28/2018   PTH 316 (H) 11/27/2017   CALCIUM 11.8 (H) 08/11/2019   CALCIUM 11.1 (H) 10/28/2018   CALCIUM 11.1 (H) 10/28/2018   CALCIUM 11.6 (H) 11/27/2017   CALCIUM 10.9 (H) 11/11/2017   CALCIUM 10.7 (H) 10/26/2010   CALCIUM 10.6 (H) 10/19/2010   CALCIUM 10.5 01/27/2009   No fractures, falls, or history of osteoporosis.  She had a kidney stone episode in 2018.  No history of CKD: Lab Results  Component Value Date   BUN 14 08/11/2019   BUN 12 10/28/2018   CREATININE 0.83 08/11/2019   CREATININE 0.80 10/28/2018   She is not on HCTZ now-we switched from this to Lasix 10 mg daily in the past.  She has a history of vitamin D deficiency Lab Results  Component Value Date   VD25OH 22.85 (L) 04/30/2019   VD25OH 23.14 (L) 03/11/2019   VD25OH 21 (L) 10/28/2018   VD25OH 17.47 (L) 02/26/2018   In 02/2018: She was on 1000 units vitamin D daily from multivitamins.  I advised her to increase  this to 3000 units by adding another 2000 units vitamin D daily.  However, at last visit, she was still taking 1000 units daily.  I advised her to increase to 3000 units since her vitamin D level was still low.  In 02/2019, we increased to 5000 units.  On this, vitamin D level was still low in 04/2019.  At that time we increased to 5000 units alternating with 10,000 units every other day.  She continues on this dose now.    Of note, she is on PPIs, which may affect the absorption of vitamin D.  She denies a FH of hypercalcemia, pituitary tumors, ?thyroid cancer, MEN sd's, or osteoporosis.  Her parents both had kidney stones  Pt. also has a history of LE lymphedema L>R.  She started to go to lymphedema clinic in [place].  The clinic in [place] is only caring for cancer patients.  Pt has a h/o lap band 2010 >> initially lost approximately 100 pounds, then gained 30 back after she got married.  She may have a gastric sleeve done in the future.  Since last visit, she gained approximately 5 pounds.  No history of thyrotoxicosis Lab Results  Component Value Date   TSH 1.90 02/26/2018   ROS: Constitutional: no weight gain/no weight loss, no fatigue, no subjective hyperthermia, no subjective hypothermia, + fluid retention Eyes: no blurry vision, no xerophthalmia ENT: no sore throat, no nodules palpated in neck, no dysphagia, no odynophagia, no  hoarseness Cardiovascular: no CP/no SOB/no palpitations/no leg swelling Respiratory: no cough/no SOB/no wheezing Gastrointestinal: no N/no V/no D/+ C/no acid reflux, + AP Musculoskeletal: no muscle aches/+ joint aches Skin: no rashes, no hair loss Neurological: no tremors/no numbness/no tingling/no dizziness  I reviewed pt's medications, allergies, PMH, social hx, family hx, and changes were documented in the history of present illness. Otherwise, unchanged from my initial visit note.  Past Medical History:  Diagnosis Date  . Chronic venous  insufficiency 09/29/2017  . Deep venous thrombosis of lower extremity ([place]) 11/17/2015  . Depression   . History of DVT (deep vein thrombosis)   . Primary hyperparathyroidism ([place]) 11/28/2017  . Sleep difficulties    Past Surgical History:  Procedure Laterality Date  . COLONOSCOPY  2014  . GASTRIC RESTRICTION SURGERY     lap band  . HERNIA REPAIR     Social History   Socioeconomic History  . Marital status: Married    Spouse name: Not on file  . Number of children: 2  . Years of education: Not on file  . Highest education level: Not on file  Occupational History  .  Radiology tech  Tobacco Use  . Smoking status: Never Smoker  . Smokeless tobacco: Never Used  Substance and Sexual Activity  . Alcohol use:  Yes, socially, 4-5 times yearly, liquor/wine  . Drug use: No  . Sexual activity: Yes    Birth control/protection: Post-menopausal   Current Outpatient Medications on File Prior to Visit  Medication Sig Dispense Refill  . albuterol (VENTOLIN HFA) 108 (90 Base) MCG/ACT inhaler Inhale 2 puffs into the lungs every 4 (four) hours as needed for wheezing or shortness of breath (and cough x 3 days, then prn). 1 Inhaler 0  . AMBULATORY NON FORMULARY MEDICATION Knee-high, medium compression, graduated compression stockings. Apply to bilateral lower extremities daily 1 each 0  . aspirin EC 81 MG tablet Take 81 mg by mouth daily.    . celecoxib (CELEBREX) 100 MG capsule 1 tab PO bid or 2 tabs PO QD 60 capsule 2  . Cholecalciferol (VITAMIN D) 125 MCG (5000 UT) CAPS Take 1 capsule by mouth daily.     . Elastic Bandages & Supports (T.E.D. BELOW KNEE/X-LARGE) MISC Apply to lower extremities daily. Remove at bedtime 2 each 0  . gabapentin (NEURONTIN) 300 MG capsule Take 1 capsule (300 mg total) by mouth 3 (three) times daily. 90 capsule 3  . NONFORMULARY OR COMPOUNDED ITEM Knee high medium compression stockings. Wear daily. Remove at bedtime 2 each 0  . vitamin B-12 (CYANOCOBALAMIN) 1000 MCG  tablet Take 1,000 mcg by mouth daily.    Marland Kitchen zolpidem (AMBIEN) 5 MG tablet Take 5 mg by mouth at bedtime as needed. For sleep      No current facility-administered medications on file prior to visit.   Allergies  Allergen Reactions  . Latex Rash    Where touched and will spread.  Marland Kitchen Penicillin G Itching   Family History  Problem Relation Age of Onset  . Stroke Mother   . Hypertension Mother   . Heart disease Father   . Heart attack Father   . Alcohol abuse Father   . Prostate cancer Father   . Uterine cancer Maternal Grandmother     PE: BP 120/70   Pulse 62   Ht 5' 5.5" (1.664 m)   Wt 296 lb (134.3 kg)   LMP 04/24/2014   SpO2 97%   BMI 48.51 kg/m  Wt Readings from Last  3 Encounters:  09/03/19 296 lb (134.3 kg)  08/06/19 293 lb 1.9 oz (133 kg)  05/11/19 291 lb 11.2 oz (132.3 kg)   Constitutional: overweight, in NAD Eyes: PERRLA, EOMI, no exophthalmos ENT: moist mucous membranes, no thyromegaly, no cervical lymphadenopathy Cardiovascular: RRR, No MRG, + LE edema Respiratory: CTA B Gastrointestinal: abdomen soft, NT, ND, BS+ Musculoskeletal: no deformities, strength intact in all 4 Skin: moist, warm, no rashes Neurological: no tremor with outstretched hands, DTR normal in all 4  Assessment: 1. Hypercalcemia/hyperparathyroidism  2.  Vitamin D deficiency  3.  Right thyroid fullness  Plan: Patient with several instances of elevated calcium, with the highest level being 11.6.  The corresponding intact PTH level was also high, at 316.  Subsequent intact PTH ([facility]) was still high at 207 for a calcium of 11.1.  Latest calcium level was 11.8%, even higher than before. -WHen I first saw the patient her vitamin D level was also low, 17.47.  We increase her vitamin D supplementation but unfortunately subsequent vitamin D levels did not increase above 23 (02/2018).  We increased the dose to 5000 units then.  Subsequent vitamin D level was still low so at last visit we increase  to 5000 units alternating with 10,000 units every other day.  At this visit, she tells me that she is mostly taking 5000 units daily since she split the 10,000 unit dose into daily doses and she is missing the evening dose 70% of the time. -She does have a history of nephrolithiasis but no history of osteoporosis, fractures, no abdominal pain, depression.  She has chronic significant constipation and does have aches and pains for which she recently had rheumatologic work-up which essentially negative results. - we discussed about possible side effects from increased PTH and hypercalcemia to include kidney stones and osteoporosis long-term -We also discussed that hyperparathyroidism can be secondary to another condition (for example vitamin D deficiency, CKD, calcium malabsorption, hypercalciuria) or it can be primary (familial hypercalcemic hypercalciuria or parathyroid adenoma).  In her case, we tried repeatedly to normalize her vitamin D without much success.  She is currently on a fairly high dose of vitamin D. -At today's visit, we will also check: Calcium Intact PTH-[facility] Magnesium Phosphorus Calcitriol -If the test indicate a parathyroid adenoma, she agrees to referral to surgery. -She meets criteria for surgery: . Increased calcium >1 mg/dL . Ossurgeryrosis (or vertebral fracture) . Age <53 years old . High UCa >400 mg/d and increased stone risk by biochemical stone risk analysis . Presence of nephrolithiasis or nephrocalcinosis . Pt's preference!  -We may need to also check a DEXA scan to see if she has osteoporosis and include a 33% distal radius for evaluation of cortical bone, most frequently impacted by high PTH - I will see the patient back in 4 months  2.  Vitamin D deficiency -She has a history of low vitamin D and started on 1000 units vitamin D daily.  This was then increased to 3000 units daily and then to 5000 units daily. -Latest vitamin D level was still low even after  increasing her vitamin D supplement to 5000 units every day -At last visit, I advised her to increase to 5000 units alternating with 10,000 units every other day, however, she continues mostly 5000 units daily as she is mostly missing the second 5000 units dose every other day. -I advised her that we can continue the alternating dose, but she can take the 10,000 units together in the morning, and  she feels that this will work better for her. -We will recheck the level today -However, if the vitamin D level is not better, I believe that I still need to refer her to surgery.  Her PTH is very high, more than expected from low vitamin D.  Also, I will not check a urine calcium collection, since such a high PTH would be very unlikely for [facility].  She will need increased observation during surgery to monitor for hypocalcemia, though.  3.  Right thyroid fullness -Felt on palpation today -No neck compression symptoms, but she does feel the fullness, also -We will check a thyroid ultrasound.  This may also show any parathyroid neck masses.  Orders Placed This Encounter  Procedures  . US THYROID  . PTH, intact and calcium  . Phosphorus  . Vitamin D 1,25 dihydroxy  . Magnesium   Component     Latest Ref Rng & Units 09/03/2019          Calcium     8.7 - 10.2 mg/dL 11.5 (H)  PTH, Intact     15 - 65 pg/mL 116 (H)  PTH Interp      Comment  Vitamin D 1, 25 (OH) Total     18 - 72 pg/mL 115 (H)  Vitamin D3 1, 25 (OH)     pg/mL 115  Vitamin D2 1, 25 (OH)     pg/mL <8  Magnesium     1.5 - 2.5 mg/dL 2.1  Phosphorus     2.3 - 4.6 mg/dL 3.0   We also added a vitamin D and is finally returned to normal, at 46.0.  The result will be abstracted and scanned in the chart.  At this point, I will ask her to perform the 24-hour urine collection and in the meantime we will refer her to surgery.  Thyroid ultrasound (09/09/2019): Parenchymal Echotexture: Mildly heterogenous Isthmus: 0.5 cm Right lobe: 6.0 x  3.0 x 3.1 cm Left lobe: 4.1 x 1.5 x 1.3 cm _________________________________________________________  Estimated total number of nodules >/= 1 cm: 1 Number of spongiform nodules >/=  2 cm not described below (TR1): 0 Number of mixed cystic and solid nodules >/= 1.5 cm not described below (TR2): 0 _________________________________________________________  Nodule # 1: Location: Right; Mid Maximum size: 4.6 cm; Other 2 dimensions: 3.2 x 2.4 cm Composition: solid/almost completely solid (2) Echogenicity: isoechoic (1) Echogenic foci: peripheral calcifications (2) and macrocalcifications (1 ACR TI-RADS total points: 6. ACR TI-RADS risk category: TR4 (4-6 points). ACR TI-RADS recommendations: **Given size (>/= 1.5 cm) and appearance, fine needle aspiration of this moderately suspicious nodule should be considered based on TI-RADS criteria. _______________________________________________________  IMPRESSION: Heterogeneous and slightly ill-defined 4.6 cm TI-RADS category 4 nodule occupying the majority of the right mid gland meets criteria to consider fine-needle aspiration biopsy. Biopsy is recommended.  The above is in keeping with the ACR TI-RADS recommendations - J Am Coll Radiol 2017;14:587-595.  Electronically Signed   By: Jacqulynn Cadet M.D.   On: 09/09/2019 14:00  Large R thyroid nodule >> will proceed with FNA-scheduled for 09/24/2019.  Addendum: 24-hour urine calcium elevated: Component     Latest Ref Rng & Units 09/16/2019  Creatinine, 24H Ur     0.50 - 2.15 g/24 h 1.23  Calcium, 24H Urine     mg/24 h 400 (H)   Thyroid Bx (09/24/2019): benign:  Clinical History: Right Mid 4.6cm; Other 2 dimensions: 3.2 x 2.4 cm  solid/almost completely solid isoechoic TI-RADS - 6  Specimen Submitted: A. THYROID, RMP, FINE NEEDLE ASPIRATION:   FINAL MICROSCOPIC DIAGNOSIS:  - Consistent with benign follicular nodule ([employer] category II)   SPECIMEN ADEQUACY:   Satisfactory for evaluation   Philemon Kingdom, MD PhD [facility]

## 2019-09-03 NOTE — Patient Instructions (Addendum)
Please continue vitamin D 10,000 units (move this all to am) alternating with 5000 units every other day.  Please stop at the lab.  Please come back for a follow-up appointment in 4 months.

## 2019-09-04 ENCOUNTER — Other Ambulatory Visit: Payer: Self-pay

## 2019-09-04 ENCOUNTER — Other Ambulatory Visit: Payer: Self-pay | Admitting: Internal Medicine

## 2019-09-04 DIAGNOSIS — E213 Hyperparathyroidism, unspecified: Secondary | ICD-10-CM

## 2019-09-04 DIAGNOSIS — E0789 Other specified disorders of thyroid: Secondary | ICD-10-CM

## 2019-09-04 DIAGNOSIS — E559 Vitamin D deficiency, unspecified: Secondary | ICD-10-CM

## 2019-09-04 LAB — PTH, INTACT AND CALCIUM
Calcium: 11.5 mg/dL — ABNORMAL HIGH (ref 8.7–10.2)
PTH: 116 pg/mL — ABNORMAL HIGH (ref 15–65)

## 2019-09-04 LAB — MAGNESIUM: Magnesium: 2.1 mg/dL (ref 1.5–2.5)

## 2019-09-04 LAB — PHOSPHORUS: Phosphorus: 3 mg/dL (ref 2.3–4.6)

## 2019-09-05 LAB — SPECIMEN STATUS REPORT

## 2019-09-05 LAB — VITAMIN D 25 HYDROXY (VIT D DEFICIENCY, FRACTURES): Vit D, 25-Hydroxy: 46 ng/mL (ref 30.0–100.0)

## 2019-09-06 LAB — VITAMIN D 1,25 DIHYDROXY
Vitamin D 1, 25 (OH)2 Total: 115 pg/mL — ABNORMAL HIGH (ref 18–72)
Vitamin D2 1, 25 (OH)2: <8 pg/mL
Vitamin D3 1, 25 (OH)2: 115 pg/mL

## 2019-09-08 ENCOUNTER — Ambulatory Visit: Payer: No Typology Code available for payment source | Admitting: Internal Medicine

## 2019-09-09 ENCOUNTER — Other Ambulatory Visit: Payer: Self-pay

## 2019-09-09 ENCOUNTER — Other Ambulatory Visit: Payer: No Typology Code available for payment source

## 2019-09-09 ENCOUNTER — Ambulatory Visit
Admission: RE | Admit: 2019-09-09 | Discharge: 2019-09-09 | Disposition: A | Discharge status: Home / Self Care | Payer: No Typology Code available for payment source | Source: Ambulatory Visit | Attending: Internal Medicine | Admitting: Internal Medicine

## 2019-09-09 DIAGNOSIS — E0789 Other specified disorders of thyroid: Secondary | ICD-10-CM

## 2019-09-09 NOTE — Addendum Note (Signed)
Addended by: Philemon Kingdom on: 09/09/2019 05:30 PM   Modules accepted: Orders

## 2019-09-16 ENCOUNTER — Other Ambulatory Visit: Payer: No Typology Code available for payment source

## 2019-09-16 ENCOUNTER — Other Ambulatory Visit: Payer: Self-pay

## 2019-09-16 DIAGNOSIS — E213 Hyperparathyroidism, unspecified: Secondary | ICD-10-CM

## 2019-09-17 LAB — EXTRA URINE SPECIMEN

## 2019-09-17 LAB — CALCIUM, URINE, 24 HOUR: Calcium, 24H Urine: 400 mg/24 h — ABNORMAL HIGH

## 2019-09-17 LAB — CREATININE, URINE, 24 HOUR: Creatinine, 24H Ur: 1.23 g/(24.h) (ref 0.50–2.15)

## 2019-09-22 ENCOUNTER — Encounter: Payer: Self-pay | Admitting: Nurse Practitioner

## 2019-09-22 ENCOUNTER — Other Ambulatory Visit: Payer: Self-pay | Admitting: Internal Medicine

## 2019-09-22 DIAGNOSIS — L608 Other nail disorders: Secondary | ICD-10-CM

## 2019-09-22 NOTE — Telephone Encounter (Signed)
Referral pended, unsure of diagnosis to add

## 2019-09-22 NOTE — Telephone Encounter (Signed)
Referral to podiatrist sent for evaluation of brittle toenails.

## 2019-09-23 ENCOUNTER — Other Ambulatory Visit: Payer: Self-pay | Admitting: Internal Medicine

## 2019-09-24 ENCOUNTER — Ambulatory Visit
Admission: RE | Admit: 2019-09-24 | Discharge: 2019-09-24 | Disposition: A | Discharge status: Home / Self Care | Payer: No Typology Code available for payment source | Source: Ambulatory Visit | Attending: Internal Medicine | Admitting: Internal Medicine

## 2019-09-24 ENCOUNTER — Other Ambulatory Visit (HOSPITAL_COMMUNITY)
Admission: RE | Admit: 2019-09-24 | Discharge: 2019-09-24 | Disposition: A | Discharge status: Home / Self Care | Payer: No Typology Code available for payment source | Source: Ambulatory Visit | Attending: Internal Medicine | Admitting: Internal Medicine

## 2019-09-24 DIAGNOSIS — E041 Nontoxic single thyroid nodule: Secondary | ICD-10-CM

## 2019-09-24 DIAGNOSIS — D34 Benign neoplasm of thyroid gland: Secondary | ICD-10-CM | POA: Insufficient documentation

## 2019-09-25 LAB — CYTOLOGY - NON PAP

## 2019-10-02 ENCOUNTER — Ambulatory Visit: Payer: No Typology Code available for payment source | Admitting: Podiatry

## 2019-10-02 ENCOUNTER — Other Ambulatory Visit: Payer: Self-pay

## 2019-10-02 ENCOUNTER — Encounter: Payer: Self-pay | Admitting: Podiatry

## 2019-10-02 DIAGNOSIS — B351 Tinea unguium: Secondary | ICD-10-CM | POA: Diagnosis not present

## 2019-10-02 LAB — HEPATIC FUNCTION PANEL
AG Ratio: 1.8 (calc) (ref 1.0–2.5)
ALT: 17 U/L (ref 6–29)
AST: 17 U/L (ref 10–35)
Albumin: 4 g/dL (ref 3.6–5.1)
Alkaline phosphatase (APISO): 88 U/L (ref 37–153)
Bilirubin, Direct: 0.1 mg/dL (ref 0.0–0.2)
Globulin: 2.2 g/dL (calc) (ref 1.9–3.7)
Indirect Bilirubin: 0.4 mg/dL (calc) (ref 0.2–1.2)
Total Bilirubin: 0.5 mg/dL (ref 0.2–1.2)
Total Protein: 6.2 g/dL (ref 6.1–8.1)

## 2019-10-02 MED ORDER — TERBINAFINE HCL 250 MG PO TABS: 250.0000 mg | ORAL_TABLET | Freq: Every day | ORAL | 0 refills | Status: DC

## 2019-10-05 NOTE — Progress Notes (Signed)
Subjective:   Patient ID: Cynthia [name], female   DOB: 54 y.o.   MRN: [id number]   HPI Patient presents stating that she has thickened nails on both feet that are yellow and discolored with the left being the worse big toenail second and third.  States she is tried to trim the over-the-counter remedies without relief and would like to see if these could be improved.  Does not smoke likes to be active   Review of Systems  All other systems reviewed and are negative.       Objective:  Physical Exam Vitals and nursing note reviewed.  Constitutional:      Appearance: She is well-developed.  Pulmonary:     Effort: Pulmonary effort is normal.  Musculoskeletal:        General: Normal range of motion.  Skin:    General: Skin is warm.  Neurological:     Mental Status: She is alert.     Neurovascular status intact muscle strength adequate range of motion within normal limits.  Patient is found to have thick brittle nailbeds 1-5 both feet that are dystrophic they are nonpainful but they are cosmetically irritating for her and at times very difficult to cut.  Patient has good digital perfusion well oriented x3     Assessment:  Mycotic nail infection with also some ingrown component to the nailbeds     [name]:  H&P reviewed condition and recommended treatment.  Discussed different treatment options including topicals and oral laser improving ago with oral and liver function studies ordered.  I reviewed with her oral medication and the risk of doing this and she wants to do it and will take oral Lamisil 1 pill a day for 90 days and will be checked back and hopefully will see improvement but I explained it may take upwards of 6 months

## 2019-10-23 ENCOUNTER — Encounter: Payer: Self-pay | Admitting: Nurse Practitioner

## 2019-10-26 ENCOUNTER — Encounter: Payer: Self-pay | Admitting: Nurse Practitioner

## 2019-12-07 ENCOUNTER — Other Ambulatory Visit: Payer: Self-pay

## 2019-12-07 ENCOUNTER — Encounter: Payer: Self-pay | Admitting: Nurse Practitioner

## 2019-12-07 ENCOUNTER — Other Ambulatory Visit: Payer: Self-pay | Admitting: Nurse Practitioner

## 2019-12-07 ENCOUNTER — Ambulatory Visit (INDEPENDENT_AMBULATORY_CARE_PROVIDER_SITE_OTHER): Payer: No Typology Code available for payment source | Admitting: Nurse Practitioner

## 2019-12-07 VITALS — BP 122/70 | HR 71 | Temp 98.0°F | Ht 70.5 in | Wt 298.0 lb

## 2019-12-07 DIAGNOSIS — G4726 Circadian rhythm sleep disorder, shift work type: Secondary | ICD-10-CM | POA: Diagnosis not present

## 2019-12-07 DIAGNOSIS — F439 Reaction to severe stress, unspecified: Secondary | ICD-10-CM

## 2019-12-07 DIAGNOSIS — Z6841 Body Mass Index (BMI) 40.0 and over, adult: Secondary | ICD-10-CM

## 2019-12-07 DIAGNOSIS — I872 Venous insufficiency (chronic) (peripheral): Secondary | ICD-10-CM

## 2019-12-07 DIAGNOSIS — M25551 Pain in right hip: Secondary | ICD-10-CM | POA: Diagnosis not present

## 2019-12-07 DIAGNOSIS — K5900 Constipation, unspecified: Secondary | ICD-10-CM

## 2019-12-07 DIAGNOSIS — E559 Vitamin D deficiency, unspecified: Secondary | ICD-10-CM

## 2019-12-07 DIAGNOSIS — Z0000 Encounter for general adult medical examination without abnormal findings: Secondary | ICD-10-CM

## 2019-12-07 MED ORDER — ZOLPIDEM TARTRATE 5 MG PO TABS: 5.0000 mg | ORAL_TABLET | Freq: Every evening | ORAL | 3 refills | Status: DC | PRN

## 2019-12-07 MED ORDER — GABAPENTIN 300 MG PO CAPS: 600.0000 mg | ORAL_CAPSULE | Freq: Three times a day (TID) | ORAL | 3 refills | Status: DC

## 2019-12-07 MED ORDER — CELECOXIB 100 MG PO CAPS: ORAL_CAPSULE | ORAL | 2 refills | Status: DC

## 2019-12-07 MED ORDER — FLUOXETINE HCL 20 MG PO CAPS: 20.0000 mg | ORAL_CAPSULE | Freq: Every day | ORAL | 3 refills | Status: DC

## 2019-12-07 MED FILL — CELECOXIB 100 MG CAPS: 100 | 30 days supply | Qty: 60 | Fill #0

## 2019-12-07 MED FILL — ZOLPIDEM TARTRATE 5 MG TAB: 5 | 30 days supply | Qty: 30 | Fill #0

## 2019-12-07 MED FILL — GABAPENTIN 300 MG CAPSULE: 300 | 20 days supply | Qty: 120 | Fill #0

## 2019-12-07 MED FILL — FLUoxetine HCL 20 MG CAPS: 20 | 30 days supply | Qty: 30 | Fill #0

## 2019-12-07 NOTE — Progress Notes (Signed)
Established Patient Office Visit  Subjective:  Patient ID: Cynthia [name], female    DOB: Sep 13, 1965  Age: 54 y.o. MRN: [id number]  CC:  Chief Complaint  Patient presents with  . Annual Exam    HPI Cynthia [name] presents for her annual physical exam.  She has been seeing Dr. Renne Crigler with endocrinology for her hyperparathyroidism. Her most recent biopsy showed no malignancy in the thyroid nodule present. She will follow-up at the end of next month for further evaluation.  She has an appointment with gastroenterology in 2 weeks to meet with the nurse to discuss her options for gastric band removal and gastric sleeve surgery. She will also discuss her ongoing issues with constipation at that time.  She reports she has been having chronic constipation since about November of last year. She was hopeful regulation of her vitamin D and calcium would stop this, but so far this has not happened. She is taking Miralax on her days off and reports soft bowel movements with use, but on work days she is unable to have a bowel movement and she gets quite bloated. She has tried metamucil, dulcolax, and glycerin suppositories, which are not helpful. She reports drinking plenty of fluids and eating a diet with fresh fruit and vegetables.   She has been working on increasing her exercise and is walking daily. She plans to start biking in the near future and is excited about her progress.   She does report an increase in stress in her life lately with both work and family. She reports that she has been on Prozac in the past and this helped with her stress levels. She states that she did not necessarily feel a huge difference, but she could tell the medication was effective by the way others interacted with her. She would like to try this again today.   She is post-menopausal and has not had any abnormal bleeding. She does reports an instance last month with increased vaginal discharge, but she was  able to take an over the counter medication and this cleared up on its own.   She denies changes to her bladder function. She does feel that sometimes she has increased urgency, but no incontinence, burning, or pain.   She is scheduled for her Pap and Mammogram next month She has regular vision and dental exams.  She is sexually active in a monogamous relationship and denies any concerns with her sexual health at this time.    Past Medical History:  Diagnosis Date  . Chronic venous insufficiency 09/29/2017  . Deep venous thrombosis of lower extremity ([place]) 11/17/2015  . Depression   . History of DVT (deep vein thrombosis)   . Primary hyperparathyroidism ([place]) 11/28/2017  . Sleep difficulties     Past Surgical History:  Procedure Laterality Date  . COLONOSCOPY  2014  . GASTRIC RESTRICTION SURGERY     lap band  . HERNIA REPAIR      Family History  Problem Relation Age of Onset  . Stroke Mother   . Hypertension Mother   . Heart disease Father   . Heart attack Father   . Alcohol abuse Father   . Prostate cancer Father   . Uterine cancer Maternal Grandmother     Social History   Socioeconomic History  . Marital status: Married    Spouse name: Not on file  . Number of children: Not on file  . Years of education: Not on file  . Highest education level:  Not on file  Occupational History  . Not on file  Tobacco Use  . Smoking status: Never Smoker  . Smokeless tobacco: Never Used  Substance and Sexual Activity  . Alcohol use: No  . Drug use: No  . Sexual activity: Yes    Birth control/protection: Post-menopausal  Other Topics Concern  . Not on file  Social History Narrative  . Not on file   Social Determinants of Health   Financial Resource Strain:   . Difficulty of Paying Living Expenses:   Food Insecurity:   . Worried About Charity fundraiser in the Last Year:   . Arboriculturist in the Last Year:   Transportation Needs:   . Film/video editor (Medical):    Marland Kitchen Lack of Transportation (Non-Medical):   Physical Activity:   . Days of Exercise per Week:   . Minutes of Exercise per Session:   Stress:   . Feeling of Stress :   Social Connections:   . Frequency of Communication with Friends and Family:   . Frequency of Social Gatherings with Friends and Family:   . Attends Religious Services:   . Active Member of Clubs or Organizations:   . Attends Archivist Meetings:   Marland Kitchen Marital Status:   Intimate Partner Violence:   . Fear of Current or Ex-Partner:   . Emotionally Abused:   Marland Kitchen Physically Abused:   . Sexually Abused:     Outpatient Medications Prior to Visit  Medication Sig Dispense Refill  . albuterol (VENTOLIN HFA) 108 (90 Base) MCG/ACT inhaler Inhale 2 puffs into the lungs every 4 (four) hours as needed for wheezing or shortness of breath (and cough x 3 days, then prn). 1 Inhaler 0  . AMBULATORY NON FORMULARY MEDICATION Knee-high, medium compression, graduated compression stockings. Apply to bilateral lower extremities daily 1 each 0  . aspirin EC 81 MG tablet Take 81 mg by mouth daily.    . celecoxib (CELEBREX) 100 MG capsule 1 tab PO bid or 2 tabs PO QD 60 capsule 2  . Cholecalciferol (VITAMIN D) 125 MCG (5000 UT) CAPS Take 1 capsule by mouth daily.     . Elastic Bandages & Supports (T.E.D. BELOW KNEE/X-LARGE) MISC Apply to lower extremities daily. Remove at bedtime 2 each 0  . gabapentin (NEURONTIN) 300 MG capsule Take 1 capsule (300 mg total) by mouth 3 (three) times daily. 90 capsule 3  . NONFORMULARY OR COMPOUNDED ITEM Knee high medium compression stockings. Wear daily. Remove at bedtime 2 each 0  . terbinafine (LAMISIL) 250 MG tablet Take 1 tablet (250 mg total) by mouth daily. 90 tablet 0  . zolpidem (AMBIEN) 5 MG tablet Take 5 mg by mouth at bedtime as needed. For sleep     . vitamin B-12 (CYANOCOBALAMIN) 1000 MCG tablet Take 1,000 mcg by mouth daily. (Patient not taking: Reported on 12/07/2019)     No  facility-administered medications prior to visit.    Allergies  Allergen Reactions  . Latex Rash    Where touched and will spread.  Marland Kitchen Penicillin G Itching    ROS    Objective:    Physical Exam Vitals and nursing note reviewed.  Constitutional:      Appearance: Normal appearance. She is obese.  HENT:     Head: Normocephalic and atraumatic.     Right Ear: Tympanic membrane, ear canal and external ear normal.     Left Ear: Tympanic membrane, ear canal and external ear normal.  Nose: Nose normal.     Mouth/Throat:     Mouth: Mucous membranes are moist.     Pharynx: Oropharynx is clear.  Eyes:     Extraocular Movements: Extraocular movements intact.     Conjunctiva/sclera: Conjunctivae normal.     Pupils: Pupils are equal, round, and reactive to light.  Neck:     Thyroid: Thyroid mass present.     Vascular: No carotid bruit.  Cardiovascular:     Rate and Rhythm: Normal rate and regular rhythm.     Pulses: Normal pulses.     Heart sounds: Normal heart sounds.  Pulmonary:     Effort: Pulmonary effort is normal.     Breath sounds: Normal breath sounds.  Abdominal:     General: Abdomen is flat. Bowel sounds are normal. There is no distension.     Palpations: Abdomen is soft. There is no mass.     Tenderness: There is no abdominal tenderness. There is no right CVA tenderness, left CVA tenderness, guarding or rebound.  Musculoskeletal:     Cervical back: Full passive range of motion without pain. No tenderness. No spinous process tenderness or muscular tenderness.     Right lower leg: Edema present.     Left lower leg: Edema present.  Lymphadenopathy:     Cervical: No cervical adenopathy.  Skin:    General: Skin is warm and dry.     Capillary Refill: Capillary refill takes less than 2 seconds.  Neurological:     General: No focal deficit present.     Mental Status: She is alert and oriented to person, place, and time.     Cranial Nerves: No cranial nerve deficit.      Sensory: No sensory deficit.     Motor: No weakness.     Coordination: Coordination normal.     Gait: Gait normal.     Deep Tendon Reflexes: Reflexes normal.  Psychiatric:        Mood and Affect: Mood normal.        Behavior: Behavior normal.        Thought Content: Thought content normal.        Judgment: Judgment normal.     BP 122/70 (BP Location: Right Arm, Patient Position: Sitting)   Pulse 71   Temp 98 F (36.7 C)   Ht 5' 10.5" (1.791 m)   Wt 298 lb (135.2 kg)   LMP 04/24/2014   SpO2 100%   BMI 42.15 kg/m  Wt Readings from Last 3 Encounters:  12/07/19 298 lb (135.2 kg)  09/03/19 296 lb (134.3 kg)  08/06/19 293 lb 1.9 oz (133 kg)     Health Maintenance Due  Topic Date Due  . Hepatitis C Screening  Never done  . COLONOSCOPY  10/11/2019    There are no preventive care reminders to display for this patient.  Lab Results  Component Value Date   TSH 1.90 02/26/2018   Lab Results  Component Value Date   WBC 5.2 08/11/2019   HGB 13.1 08/11/2019   HCT 41.1 08/11/2019   MCV 79.8 (L) 08/11/2019   PLT 220 08/11/2019   Lab Results  Component Value Date   NA 144 08/11/2019   K 4.3 08/11/2019   CO2 27 08/11/2019   GLUCOSE 82 08/11/2019   BUN 14 08/11/2019   CREATININE 0.83 08/11/2019   BILITOT 0.5 10/02/2019   ALKPHOS 51 10/26/2010   AST 17 10/02/2019   ALT 17 10/02/2019   PROT 6.2 10/02/2019  ALBUMIN 3.1 (L) 10/26/2010   CALCIUM 11.5 (H) 09/03/2019   Lab Results  Component Value Date   CHOL 140 11/11/2017   Lab Results  Component Value Date   HDL 35 (L) 11/11/2017   Lab Results  Component Value Date   LDLCALC 86 11/11/2017   Lab Results  Component Value Date   TRIG 95 11/11/2017   Lab Results  Component Value Date   CHOLHDL 4.0 11/11/2017   Lab Results  Component Value Date   HGBA1C 5.4 10/28/2018      Assessment & [name]:   1. Encounter for annual physical exam Routine annual physical examination today with no significant  abnormalities noted. She has been working on increasing her exercise to help with weight loss. She is also working on her diet. All VSS today. Lab work was performed in the last three months. She is being followed by Dr. Renne Crigler with endocrinology for her thyroid dysfunction and will follow-up with them next month to discuss possible surgery to remove benign thyroid mass and follow-up labs. Will hold off on additional labs at this time.   2. Right hip pain Chronic right hip pain, doing well with daily celebrex. She does feel that the gabapentin is not extremely helpful at the current dosage. We will trial increasing the dose of this to 600mg  TID for neuropathic pain. Refills provided today.  - celecoxib (CELEBREX) 100 MG capsule; 1 tab PO bid or 2 tabs PO QD  Dispense: 60 capsule; Refill: 2 - gabapentin (NEURONTIN) 300 MG capsule; Take 2 capsules (600 mg total) by mouth 3 (three) times daily.  Dispense: 120 capsule; Refill: 3  3. 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 Ambien has been effective for her for quite some time. She uses this on an as needed basis and feels that this is helpful when she is unable to sleep. Refills provided today.  - zolpidem (AMBIEN) 5 MG tablet; Take 1 tablet (5 mg total) by mouth at bedtime as needed. For sleep  Dispense: 30 tablet; Refill: 3  4. Stress Increased stress due to life circumstances. She has had success with low dose fluoxetine in the past. We will restart this today at 20mg  per day. She is to follow-up in 4-6 weeks to let me know how she is doing on the current dose. We will evaluate at that time if dose adjustment is necessary.  - FLUoxetine (PROZAC) 20 MG capsule; Take 1 capsule (20 mg total) by mouth daily.  Dispense: 30 capsule; Refill: 3  5. Venous insufficiency of both lower extremities Chronic venous insufficiency in her lower extremities with lower  extremity edema worsened by standing on her feet for long periods at work. She does have edema in her ankles today, but reports that she worked last night. She states that this resolves in the morning. Continue to wear compression stockings while working, traveling, or on your feet for extended periods to help with venous return and to reduce edema.    6. Class 3 severe obesity without serious comorbidity with body mass index (BMI) of 40.0 to 44.9 in adult, unspecified obesity type ([place]) Her BMI is down to 42.15 from 45.65 from this time last year. She is working on increasing her physical activity and changing her diet. I do feel that this will be greatly beneficial to her to prevent health complications in her future. She is motivated and willing to work,  which is fantastic. She has an appointment with GI to discuss gastric surgery in the near future and I feel this will also be beneficial for her. I am pleased with her commitment and progress and encourage her to continue working towards her goal.   7. Vitamin D deficiency Vitamin D improved with last labs. She is followed by endocrinology and has a follow-up visit next month to have labs and discuss further actions. Will follow.   8. Constipation, unspecified constipation type Ongoing constipation for the last 7-8 months requiring Miralax to facilitate bowel movements. This is concerning given that she has increased her physical activity and she is eating better. We discussed adding a daily stool softener to her medication regimen to see if this helps to soften her stools and increase the passage of stools. She may continue the miralax at this time, but encouraged her to discuss this with her gastroenterologist when she sees him in 2 weeks to determine if further evaluation is needed. I am hopeful the addition of stool softener and continued work on physical activity in addition to normalization of her calcium and vitamin d will help with this problem.    Follow-up with me in 4-6 weeks about mood. Please let me know how your appointments go with GI and Endocrinology If you need to be seen sooner, please call for an appointment.   Orma Render, NP

## 2019-12-07 NOTE — Patient Instructions (Addendum)
Try taking Colace (or another stool softener) daily to see if this helps with your constipation. I would take this daily for a couple of weeks to see if this will help. You can take this with Miralax on your days off.    Constipation, Adult Constipation is when a person has fewer bowel movements in a week than normal, has difficulty having a bowel movement, or has stools that are dry, hard, or larger than normal. Constipation may be caused by an underlying condition. It may become worse with age if a person takes certain medicines and does not take in enough fluids. Follow these instructions at home: Eating and drinking   Eat foods that have a lot of fiber, such as fresh fruits and vegetables, whole grains, and beans.  Limit foods that are high in fat, low in fiber, or overly processed, such as french fries, hamburgers, cookies, candies, and soda.  Drink enough fluid to keep your urine clear or pale yellow. General instructions  Exercise regularly or as told by your health care provider.  Go to the restroom when you have the urge to go. Do not hold it in.  Take over-the-counter and prescription medicines only as told by your health care provider. These include any fiber supplements.  Practice pelvic floor retraining exercises, such as deep breathing while relaxing the lower abdomen and pelvic floor relaxation during bowel movements.  Watch your condition for any changes.  Keep all follow-up visits as told by your health care provider. This is important. Contact a health care provider if:  You have pain that gets worse.  You have a fever.  You do not have a bowel movement after 4 days.  You vomit.  You are not hungry.  You lose weight.  You are bleeding from the anus.  You have thin, pencil-like stools. Get help right away if:  You have a fever and your symptoms suddenly get worse.  You leak stool or have blood in your stool.  Your abdomen is bloated.  You have  severe pain in your abdomen.  You feel dizzy or you faint. This information is not intended to replace advice given to you by your health care provider. Make sure you discuss any questions you have with your health care provider. Document Revised: 04/19/2017 Document Reviewed: 10/26/2015 Elsevier Patient Education  2020 [employer]   Preventive Care 40-82 Years Old, Female Preventive care refers to visits with your health care provider and lifestyle choices that can promote health and wellness. This includes:  A yearly physical exam. This may also be called an annual well check.  Regular dental visits and eye exams.  Immunizations.  Screening for certain conditions.  Healthy lifestyle choices, such as eating a healthy diet, getting regular exercise, not using drugs or products that contain nicotine and tobacco, and limiting alcohol use. What can I expect for my preventive care visit? Physical exam Your health care provider will check your:  Height and weight. This may be used to calculate body mass index (BMI), which tells if you are at a healthy weight.  Heart rate and blood pressure.  Skin for abnormal spots. Counseling Your health care provider may ask you questions about your:  Alcohol, tobacco, and drug use.  Emotional well-being.  Home and relationship well-being.  Sexual activity.  Eating habits.  Work and work Statistician.  Method of birth control.  Menstrual cycle.  Pregnancy history. What immunizations do I need?  Influenza (flu) vaccine  This is recommended  every year. Tetanus, diphtheria, and pertussis (Tdap) vaccine  You may need a Td booster every 10 years. Varicella (chickenpox) vaccine  You may need this if you have not been vaccinated. Zoster (shingles) vaccine  You may need this after age 2. Measles, mumps, and rubella (MMR) vaccine  You may need at least one dose of MMR if you were born in 1957 or later. You may also need a  second dose. Pneumococcal conjugate (PCV13) vaccine  You may need this if you have certain conditions and were not previously vaccinated. Pneumococcal polysaccharide (PPSV23) vaccine  You may need one or two doses if you smoke cigarettes or if you have certain conditions. Meningococcal conjugate (MenACWY) vaccine  You may need this if you have certain conditions. Hepatitis A vaccine  You may need this if you have certain conditions or if you travel or work in places where you may be exposed to hepatitis A. Hepatitis B vaccine  You may need this if you have certain conditions or if you travel or work in places where you may be exposed to hepatitis B. Haemophilus influenzae type b (Hib) vaccine  You may need this if you have certain conditions. Human papillomavirus (HPV) vaccine  If recommended by your health care provider, you may need three doses over 6 months. You may receive vaccines as individual doses or as more than one vaccine together in one shot (combination vaccines). Talk with your health care provider about the risks and benefits of combination vaccines. What tests do I need? Blood tests  Lipid and cholesterol levels. These may be checked every 5 years, or more frequently if you are over 46 years old.  Hepatitis C test.  Hepatitis B test. Screening  Lung cancer screening. You may have this screening every year starting at age 68 if you have a 30-pack-year history of smoking and currently smoke or have quit within the past 15 years.  Colorectal cancer screening. All adults should have this screening starting at age 46 and continuing until age 23. Your health care provider may recommend screening at age 6 if you are at increased risk. You will have tests every 1-10 years, depending on your results and the type of screening test.  Diabetes screening. This is done by checking your blood sugar (glucose) after you have not eaten for a while (fasting). You may have this done  every 1-3 years.  Mammogram. This may be done every 1-2 years. Talk with your health care provider about when you should start having regular mammograms. This may depend on whether you have a family history of breast cancer.  BRCA-related cancer screening. This may be done if you have a family history of breast, ovarian, tubal, or peritoneal cancers.  Pelvic exam and Pap test. This may be done every 3 years starting at age 37. Starting at age 30, this may be done every 5 years if you have a Pap test in combination with an HPV test. Other tests  Sexually transmitted disease (STD) testing.  Bone density scan. This is done to screen for osteoporosis. You may have this scan if you are at high risk for osteoporosis. Follow these instructions at home: Eating and drinking  Eat a diet that includes fresh fruits and vegetables, whole grains, lean protein, and low-fat dairy.  Take vitamin and mineral supplements as recommended by your health care provider.  Do not drink alcohol if: ? Your health care provider tells you not to drink. ? You are pregnant, may be  pregnant, or are planning to become pregnant.  If you drink alcohol: ? Limit how much you have to 0-1 drink a day. ? Be aware of how much alcohol is in your drink. In the [place] one drink equals one 12 oz bottle of beer (355 mL), one 5 oz glass of wine (148 mL), or one 1 oz glass of hard liquor (44 mL). Lifestyle  Take daily care of your teeth and gums.  Stay active. Exercise for at least 30 minutes on 5 or more days each week.  Do not use any products that contain nicotine or tobacco, such as cigarettes, e-cigarettes, and chewing tobacco. If you need help quitting, ask your health care provider.  If you are sexually active, practice safe sex. Use a condom or other form of birth control (contraception) in order to prevent pregnancy and STIs (sexually transmitted infections).  If told by your health care provider, take low-dose aspirin  daily starting at age 98. What's next?  Visit your health care provider once a year for a well check visit.  Ask your health care provider how often you should have your eyes and teeth checked.  Stay up to date on all vaccines. This information is not intended to replace advice given to you by your health care provider. Make sure you discuss any questions you have with your health care provider. Document Revised: 01/16/2018 Document Reviewed: 01/16/2018 Elsevier Patient Education  2020 [employer].

## 2020-01-19 ENCOUNTER — Other Ambulatory Visit: Payer: Self-pay

## 2020-01-19 ENCOUNTER — Ambulatory Visit: Payer: No Typology Code available for payment source | Admitting: Internal Medicine

## 2020-01-19 ENCOUNTER — Encounter: Payer: Self-pay | Admitting: Internal Medicine

## 2020-01-19 VITALS — BP 118/80 | HR 61 | Ht 65.5 in | Wt 287.0 lb

## 2020-01-19 DIAGNOSIS — E559 Vitamin D deficiency, unspecified: Secondary | ICD-10-CM

## 2020-01-19 DIAGNOSIS — E041 Nontoxic single thyroid nodule: Secondary | ICD-10-CM

## 2020-01-19 DIAGNOSIS — E213 Hyperparathyroidism, unspecified: Secondary | ICD-10-CM

## 2020-01-19 NOTE — Progress Notes (Signed)
Patient ID: Cynthia [name], female   DOB: [birth year], 54 y.o.   MRN: [id number]   This visit occurred during the SARS-CoV-2 public health emergency.  Safety protocols were in place, including screening questions prior to the visit, additional usage of staff PPE, and extensive cleaning of exam room while observing appropriate contact time as indicated for disinfecting solutions.   HPI  Cynthia [name] is a 54 y.o.-year-old female returning for follow-up for hypercalcemia/hyperparathyroidism and vitamin D deficiency.  Last visit 4 months ago.  Reviewed and addended history: She was diagnosed with hypercalcemia in 2012 and she was found to have a very high PTH in 11/2017.  This was confirmed in 10/2018.  Since then, we have been trying to improve her vitamin D level to further investigate and treat her hyperparathyroidism.    Please pointed towards primary hyperparathyroidism so I referred her to surgery at last visit. She saw Dr. Harlow Asa >> further repeat imaging investigation for her hyperparathyroidism is planned.  Reviewed pertinent results: Lab Results  Component Value Date   PTH 116 (H) 09/03/2019   PTH Comment 09/03/2019   PTH 207 (H) 10/28/2018   PTH 316 (H) 11/27/2017   CALCIUM 11.5 (H) 09/03/2019   CALCIUM 11.8 (H) 08/11/2019   CALCIUM 11.1 (H) 10/28/2018   CALCIUM 11.1 (H) 10/28/2018   CALCIUM 11.6 (H) 11/27/2017   CALCIUM 10.9 (H) 11/11/2017   CALCIUM 10.7 (H) 10/26/2010   CALCIUM 10.6 (H) 10/19/2010   CALCIUM 10.5 01/27/2009   She had significant constipation (now slightly better on Colace) and feeling poorly overall (now improved after starting Prozac), with generalized aches and pains.  At last visit, her vitamin D level was finally normal so we could investigate further her hyperparathyroidism (see below).  Thyroid ultrasound (09/09/2019): Parenchymal Echotexture: Mildly heterogenous Isthmus: 0.5 cm Right lobe: 6.0 x 3.0 x 3.1 cm Left lobe: 4.1 x 1.5 x 1.3  cm _________________________________________________________  Estimated total number of nodules >/= 1 cm: 1 Number of spongiform nodules >/=  2 cm not described below (TR1): 0 Number of mixed cystic and solid nodules >/= 1.5 cm not described below (TR2): 0 _________________________________________________________  Nodule # 1: Location: Right; Mid Maximum size: 4.6 cm; Other 2 dimensions: 3.2 x 2.4 cm Composition: solid/almost completely solid (2) Echogenicity: isoechoic (1) Echogenic foci: peripheral calcifications (2) and macrocalcifications (1 ACR TI-RADS total points: 6. ACR TI-RADS risk category: TR4 (4-6 points). ACR TI-RADS recommendations: **Given size (>/= 1.5 cm) and appearance, fine needle aspiration of this moderately suspicious nodule should be considered based on TI-RADS criteria. _______________________________________________________  IMPRESSION: Heterogeneous and slightly ill-defined 4.6 cm TI-RADS category 4 nodule occupying the majority of the right mid gland meets criteria to consider fine-needle aspiration biopsy. Biopsy is recommended.  Thyroid Bx (09/24/2019): Benign:  Clinical History: Right Mid 4.6cm; Other 2 dimensions: 3.2 x 2.4 cm  solid/almost completely solid isoechoic TI-RADS - 6  Specimen Submitted: A. THYROID, RMP, FINE NEEDLE ASPIRATION:   FINAL MICROSCOPIC DIAGNOSIS:  - Consistent with benign follicular nodule ([facility] category II)   SPECIMEN ADEQUACY:  Satisfactory for evaluation   We also proceeded to check her 24-hour urine calcium and this was elevated: Component     Latest Ref Rng & Units 09/16/2019  Creatinine, 24H Ur     0.50 - 2.15 g/24 h 1.23  Calcium, 24H Urine     mg/24 h 400 (H)   Her calcitriol level was high, mild magnesium and phosphorus levels were normal: Component     Latest  Ref Rng & Units 09/03/2019          Vitamin D 1, 25 (OH) Total     18 - 72 pg/mL 115 (H)  Vitamin D3 1, 25 (OH)     pg/mL 115   Vitamin D2 1, 25 (OH)     pg/mL <8  Magnesium     1.5 - 2.5 mg/dL 2.1  Phosphorus     2.3 - 4.6 mg/dL 3.0   She denies fractures, falls, or history of osteoporosis.  She had a kidney stone episode in 2018.  No CKD: Lab Results  Component Value Date   BUN 14 08/11/2019   BUN 12 10/28/2018   CREATININE 0.83 08/11/2019   CREATININE 0.80 10/28/2018   Previously on HCTZ now on Lasix.  She has a history of vitamin D deficiency: Lab Results  Component Value Date   VD25OH 46.0 09/03/2019   VD25OH 22.85 (L) 04/30/2019   VD25OH 23.14 (L) 03/11/2019   VD25OH 21 (L) 10/28/2018   VD25OH 17.47 (L) 02/26/2018   In 02/2018: She was on 1000 units vitamin D daily from multivitamins.  I advised her to increase this to 3000 units by adding another 2000 units vitamin D daily.  However, at last visit, she was still taking 1000 units daily.  I advised her to increase to 3000 units since her vitamin D level was still low.  In 02/2019, we increased to 5000 units.  On this, vitamin D level was still low in 04/2019.  At that time, we increased her vitamin D supplement to 5000 units alternating with 10,000 units every other day and her subsequent vitamin D level was normal.  Of note, she is on PPIs, which may affect the absorption of vitamin D.  No FH of hypercalcemia, pituitary tumors, ?thyroid cancer, MEN sd's, or osteoporosis.  Both her parents had kidney stones.  Pt. also has a history of LE lymphedema L>R.  She is going to the lymphedema clinic in [place].  Pt has a h/o lap band 2010 >> initially lost approximately 100 pounds, then gained 30 back after she got married.  She is considering a gastric sleeve done in the future.  No history of hypothyroidism or thyrotoxicosis: Lab Results  Component Value Date   TSH 1.90 02/26/2018   ROS: Constitutional: no weight gain/no weight loss, no fatigue, no subjective hyperthermia, no subjective hypothermia Eyes: no blurry vision, no  xerophthalmia ENT: no sore throat, no nodules palpated in neck, no dysphagia, no odynophagia, no hoarseness Cardiovascular: no CP/no SOB/no palpitations/no leg swelling Respiratory: + cough - dry/no SOB/no wheezing Gastrointestinal: no N/no V/no D/+ C (improved on Colace)/no acid reflux Musculoskeletal: no muscle aches/+ joint aches Skin: no rashes, no hair loss Neurological: no tremors/no numbness/no tingling/no dizziness  I reviewed pt's medications, allergies, PMH, social hx, family hx, and changes were documented in the history of present illness. Otherwise, unchanged from my initial visit note.  Past Medical History:  Diagnosis Date   Chronic venous insufficiency 09/29/2017   Deep venous thrombosis of lower extremity ([place]) 11/17/2015   Depression    History of DVT (deep vein thrombosis)    Primary hyperparathyroidism ([place]) 11/28/2017   Sleep difficulties    Past Surgical History:  Procedure Laterality Date   COLONOSCOPY  2014   GASTRIC RESTRICTION SURGERY     lap band   HERNIA REPAIR     Social History   Socioeconomic History   Marital status: Married    Spouse name: Not on file  Number of children: 2   Years of education: Not on file   Highest education level: Not on file  Occupational History    Radiology tech  Tobacco Use   Smoking status: Never Smoker   Smokeless tobacco: Never Used  Substance and Sexual Activity   Alcohol use:  Yes, socially, 4-5 times yearly, liquor/wine   Drug use: No   Sexual activity: Yes    Birth control/protection: Post-menopausal   Current Outpatient Medications on File Prior to Visit  Medication Sig Dispense Refill   albuterol (VENTOLIN HFA) 108 (90 Base) MCG/ACT inhaler Inhale 2 puffs into the lungs every 4 (four) hours as needed for wheezing or shortness of breath (and cough x 3 days, then prn). 1 Inhaler 0   AMBULATORY NON FORMULARY MEDICATION Knee-high, medium compression, graduated compression  stockings. Apply to bilateral lower extremities daily 1 each 0   aspirin EC 81 MG tablet Take 81 mg by mouth daily.     celecoxib (CELEBREX) 100 MG capsule 1 tab PO bid or 2 tabs PO QD 60 capsule 2   Cholecalciferol (VITAMIN D) 125 MCG (5000 UT) CAPS Take 1 capsule by mouth daily.      Elastic Bandages & Supports (T.E.D. BELOW KNEE/X-LARGE) MISC Apply to lower extremities daily. Remove at bedtime 2 each 0   FLUoxetine (PROZAC) 20 MG capsule Take 1 capsule (20 mg total) by mouth daily. 30 capsule 3   gabapentin (NEURONTIN) 300 MG capsule Take 2 capsules (600 mg total) by mouth 3 (three) times daily. 120 capsule 3   NONFORMULARY OR COMPOUNDED ITEM Knee high medium compression stockings. Wear daily. Remove at bedtime 2 each 0   terbinafine (LAMISIL) 250 MG tablet Take 1 tablet (250 mg total) by mouth daily. 90 tablet 0   vitamin B-12 (CYANOCOBALAMIN) 1000 MCG tablet Take 1,000 mcg by mouth daily. (Patient not taking: Reported on 12/07/2019)     zolpidem (AMBIEN) 5 MG tablet Take 1 tablet (5 mg total) by mouth at bedtime as needed. For sleep 30 tablet 3   No current facility-administered medications on file prior to visit.   Allergies  Allergen Reactions   Latex Rash    Where touched and will spread.   Penicillin G Itching   Family History  Problem Relation Age of Onset   Stroke Mother    Hypertension Mother    Heart disease Father    Heart attack Father    Alcohol abuse Father    Prostate cancer Father    Uterine cancer Maternal Grandmother     PE: BP 118/80    Pulse 61    Ht 5' 5.5" (1.664 m)    Wt 287 lb (130.2 kg)    LMP 04/24/2014    SpO2 97%    BMI 47.03 kg/m  Wt Readings from Last 3 Encounters:  01/19/20 287 lb (130.2 kg)  12/07/19 298 lb (135.2 kg)  09/03/19 296 lb (134.3 kg)   Constitutional: overweight, in NAD Eyes: PERRLA, EOMI, no exophthalmos ENT: moist mucous membranes, no thyromegaly, no cervical lymphadenopathy Cardiovascular: RRR, No MRG, + LE  edema - B Respiratory: CTA B Gastrointestinal: abdomen soft, NT, ND, BS+ Musculoskeletal: no deformities, strength intact in all 4 Skin: moist, warm, no rashes Neurological: no tremor with outstretched hands, DTR normal in all 4  Assessment: 1. Hypercalcemia/hyperparathyroidism  2.  Vitamin D deficiency  3.  Right thyroid fullness  [name]: Patient with several instances of elevated calcium, with the highest level being 11.6.  The corresponding intact  PTH level was also high, at 316.  Subsequent intact PTH ([facility]) was still high at 207 for a calcium of 11.1.  Latest calcium level was 11.8, even higher than before. -WHen I first saw the patient her vitamin D level was also low, 17.47.  We increased her vitamin D supplementation and finally, at last visit, her vitamin D level was normal, on 10,000 units alternating with 5000 units every other day. -She does have a history of nephrolithiasis but no osteoporosis, fractures, abdominal pain, depression.  She has chronic significant constipation (slightly improved after starting Colace) and does have aches and pains for which she had rheumatologic work-up with essentially negative results. -At last visit, since her vitamin D was normal we were able to investigate her further and 24-hour urine calcium was elevated, while calcitriol was also elevated.  Magnesium and phosphorus levels were normal.  Investigation pointed towards primary hyperparathyroidism.  We did check a thyroid ultrasound which showed a right thyroid nodule but no masses that could be indicative of parathyroid adenomas.  I referred her to surgery, for which she is a good candidate -meeting the following criteria (in bold):  Increased calcium >1 mg/dL  Ossurgeryrosis (or vertebral fracture)  Age <32 years old  High UCa >400 mg/d and increased stone risk by biochemical stone risk analysis  Presence of nephrolithiasis or nephrocalcinosis  Pt's preference!  -She had the appointment  with Dr. Harlow Asa and further imaging investigation is planned - I will see the patient back in 6 months  2.  Vitamin D deficiency -Now corrected on 5000 alternating with 10,000 units every other day of vitamin D -mentions that she missed some doses recently -Latest vitamin D level was normal 4 months ago -We will recheck her vitamin D level at next visit  3.  Right thyroid nodule -Reviewed latest thyroid ultrasound results -We biopsied this nodule after last visit and the results were benign -No neck compression symptoms -We will continue to follow the nodule for now without intervention   Philemon Kingdom, MD PhD [facility] Endocrinology

## 2020-01-19 NOTE — Patient Instructions (Signed)
Please take consistently vitamin D 10,000 units (move this all to am) alternating with 5000 units every other day.  Please come back for a follow-up appointment in 6 months.

## 2020-01-20 ENCOUNTER — Other Ambulatory Visit (HOSPITAL_COMMUNITY): Payer: Self-pay | Admitting: Surgery

## 2020-01-20 DIAGNOSIS — E210 Primary hyperparathyroidism: Secondary | ICD-10-CM

## 2020-01-28 MED FILL — FLUoxetine HCL 20 MG CAPS: 20 | 30 days supply | Qty: 30 | Fill #1

## 2020-02-02 ENCOUNTER — Encounter (HOSPITAL_COMMUNITY)
Admission: RE | Admit: 2020-02-02 | Discharge: 2020-02-02 | Disposition: A | Discharge status: Home / Self Care | Payer: No Typology Code available for payment source | Source: Ambulatory Visit | Attending: Surgery | Admitting: Surgery

## 2020-02-02 ENCOUNTER — Other Ambulatory Visit: Payer: Self-pay

## 2020-02-02 DIAGNOSIS — E210 Primary hyperparathyroidism: Secondary | ICD-10-CM | POA: Insufficient documentation

## 2020-02-02 MED ORDER — TECHNETIUM TC 99M SESTAMIBI - CARDIOLITE
26.5000 | Freq: Once | INTRAVENOUS | Status: AC | PRN
  Administered 2020-02-02: 09:00:00 26.5 via INTRAVENOUS

## 2020-02-09 ENCOUNTER — Encounter (HOSPITAL_COMMUNITY): Payer: Self-pay | Admitting: *Deleted

## 2020-02-09 ENCOUNTER — Other Ambulatory Visit: Payer: Self-pay

## 2020-02-09 ENCOUNTER — Emergency Department (HOSPITAL_COMMUNITY)
Admission: EM | Admit: 2020-02-09 | Discharge: 2020-02-10 | Disposition: A | Discharge status: Home / Self Care | Payer: No Typology Code available for payment source | Attending: Emergency Medicine | Admitting: Emergency Medicine

## 2020-02-09 DIAGNOSIS — R0602 Shortness of breath: Secondary | ICD-10-CM | POA: Insufficient documentation

## 2020-02-09 DIAGNOSIS — N3000 Acute cystitis without hematuria: Secondary | ICD-10-CM | POA: Diagnosis not present

## 2020-02-09 DIAGNOSIS — R202 Paresthesia of skin: Secondary | ICD-10-CM | POA: Diagnosis present

## 2020-02-09 DIAGNOSIS — R5383 Other fatigue: Secondary | ICD-10-CM | POA: Insufficient documentation

## 2020-02-09 DIAGNOSIS — Z20822 Contact with and (suspected) exposure to covid-19: Secondary | ICD-10-CM | POA: Insufficient documentation

## 2020-02-09 DIAGNOSIS — Z91040 Latex allergy status: Secondary | ICD-10-CM | POA: Insufficient documentation

## 2020-02-09 DIAGNOSIS — F419 Anxiety disorder, unspecified: Secondary | ICD-10-CM | POA: Diagnosis not present

## 2020-02-09 DIAGNOSIS — R42 Dizziness and giddiness: Secondary | ICD-10-CM | POA: Diagnosis not present

## 2020-02-09 DIAGNOSIS — Z7982 Long term (current) use of aspirin: Secondary | ICD-10-CM | POA: Insufficient documentation

## 2020-02-09 HISTORY — DX: Disorder of thyroid, unspecified: E07.9

## 2020-02-09 LAB — CBC
HCT: 45.1 % (ref 36.0–46.0)
Hemoglobin: 13.7 g/dL (ref 12.0–15.0)
MCH: 25 pg — ABNORMAL LOW (ref 26.0–34.0)
MCHC: 30.4 g/dL (ref 30.0–36.0)
MCV: 82.3 fL (ref 80.0–100.0)
Platelets: 222 10*3/uL (ref 150–400)
RBC: 5.48 MIL/uL — ABNORMAL HIGH (ref 3.87–5.11)
RDW: 14.5 % (ref 11.5–15.5)
WBC: 7 10*3/uL (ref 4.0–10.5)
nRBC: 0 % (ref 0.0–0.2)

## 2020-02-09 LAB — I-STAT BETA HCG BLOOD, ED (MC, WL, AP ONLY): I-stat hCG, quantitative: <5 m[IU]/mL (ref <5)

## 2020-02-09 NOTE — ED Triage Notes (Signed)
Pt reports having intermittent sob over the past few days. Today is feelling very weak and fatigued. Checked bp and it was low, she felt dizzy and flushed.

## 2020-02-10 ENCOUNTER — Ambulatory Visit: Payer: Self-pay | Admitting: Surgery

## 2020-02-10 ENCOUNTER — Emergency Department (HOSPITAL_COMMUNITY): Payer: No Typology Code available for payment source

## 2020-02-10 DIAGNOSIS — F419 Anxiety disorder, unspecified: Secondary | ICD-10-CM

## 2020-02-10 LAB — DIFFERENTIAL
Abs Immature Granulocytes: 0.02 10*3/uL (ref 0.00–0.07)
Basophils Absolute: 0.1 10*3/uL (ref 0.0–0.1)
Basophils Relative: 1 %
Eosinophils Absolute: 0.3 10*3/uL (ref 0.0–0.5)
Eosinophils Relative: 5 %
Immature Granulocytes: 0 %
Lymphocytes Relative: 26 %
Lymphs Abs: 1.7 10*3/uL (ref 0.7–4.0)
Monocytes Absolute: 0.4 10*3/uL (ref 0.1–1.0)
Monocytes Relative: 7 %
Neutro Abs: 4.1 10*3/uL (ref 1.7–7.7)
Neutrophils Relative %: 61 %

## 2020-02-10 LAB — I-STAT CHEM 8, ED
BUN: 12 mg/dL (ref 6–20)
Calcium, Ion: 0.94 mmol/L — ABNORMAL LOW (ref 1.15–1.40)
Chloride: 117 mmol/L — ABNORMAL HIGH (ref 98–111)
Creatinine, Ser: 0.6 mg/dL (ref 0.44–1.00)
Glucose, Bld: 80 mg/dL (ref 70–99)
HCT: 36 % (ref 36.0–46.0)
Hemoglobin: 12.2 g/dL (ref 12.0–15.0)
Potassium: 5 mmol/L (ref 3.5–5.1)
Sodium: 138 mmol/L (ref 135–145)
TCO2: 20 mmol/L — ABNORMAL LOW (ref 22–32)

## 2020-02-10 LAB — URINALYSIS, ROUTINE W REFLEX MICROSCOPIC
Glucose, UA: NEGATIVE mg/dL
Ketones, ur: NEGATIVE mg/dL
Nitrite: NEGATIVE
Protein, ur: 100 mg/dL — AB
Specific Gravity, Urine: 1.026 (ref 1.005–1.030)
WBC, UA: >50 WBC/hpf — ABNORMAL HIGH (ref 0–5)
pH: 5 (ref 5.0–8.0)

## 2020-02-10 LAB — BASIC METABOLIC PANEL WITH GFR
Anion gap: 10 (ref 5–15)
BUN: 11 mg/dL (ref 6–20)
CO2: 24 mmol/L (ref 22–32)
Calcium: 11.6 mg/dL — ABNORMAL HIGH (ref 8.9–10.3)
Chloride: 106 mmol/L (ref 98–111)
Creatinine, Ser: 1.06 mg/dL — ABNORMAL HIGH (ref 0.44–1.00)
GFR calc Af Amer: >60 mL/min
GFR calc non Af Amer: >60 mL/min
Glucose, Bld: 104 mg/dL — ABNORMAL HIGH (ref 70–99)
Potassium: 4 mmol/L (ref 3.5–5.1)
Sodium: 140 mmol/L (ref 135–145)

## 2020-02-10 LAB — CBC
HCT: 40.7 % (ref 36.0–46.0)
Hemoglobin: 12.1 g/dL (ref 12.0–15.0)
MCH: 25.5 pg — ABNORMAL LOW (ref 26.0–34.0)
MCHC: 29.7 g/dL — ABNORMAL LOW (ref 30.0–36.0)
MCV: 85.9 fL (ref 80.0–100.0)
Platelets: 86 10*3/uL — ABNORMAL LOW (ref 150–400)
RBC: 4.74 MIL/uL (ref 3.87–5.11)
RDW: 14.8 % (ref 11.5–15.5)
WBC: 6.6 10*3/uL (ref 4.0–10.5)
nRBC: 0 % (ref 0.0–0.2)

## 2020-02-10 LAB — RAPID URINE DRUG SCREEN, HOSP PERFORMED
Amphetamines: NOT DETECTED
Barbiturates: NOT DETECTED
Benzodiazepines: NOT DETECTED
Cocaine: NOT DETECTED
Opiates: NOT DETECTED
Tetrahydrocannabinol: NOT DETECTED

## 2020-02-10 LAB — SARS CORONAVIRUS 2 BY RT PCR (HOSPITAL ORDER, PERFORMED IN ~~LOC~~ HOSPITAL LAB): SARS Coronavirus 2: NEGATIVE

## 2020-02-10 LAB — ETHANOL: Alcohol, Ethyl (B): <10 mg/dL (ref <10)

## 2020-02-10 MED ORDER — LORAZEPAM 2 MG/ML IJ SOLN
1.0000 mg | Freq: Once | INTRAMUSCULAR | Status: AC
  Administered 2020-02-10: 1 mg via INTRAVENOUS
  Filled 2020-02-10: qty 1

## 2020-02-10 MED ORDER — SODIUM CHLORIDE 0.9 % IV BOLUS: 1000.0000 mL | Freq: Once | INTRAVENOUS | Status: DC

## 2020-02-10 MED ORDER — CEPHALEXIN 500 MG PO CAPS: 500.0000 mg | ORAL_CAPSULE | Freq: Three times a day (TID) | ORAL | 0 refills | Status: DC

## 2020-02-10 MED FILL — CEPHALEXIN 500 MG CAPSULE: 500 | 7 days supply | Qty: 21 | Fill #0

## 2020-02-10 NOTE — Consult Note (Addendum)
NEURO HOSPITALIST CONSULT NOTE   Requesting physician: Dr. Dina Rich  Reason for Consult: Acute onset of left facial discomfort and sensation of left shoulder heaviness.   History obtained from:  Patient and Chart     HPI:                                                                                                                                          Cynthia [name] is an 54 y.o. female employee of [employer] with a history of DVT, primary hyperparathyroidism, depression and morbid obesity s/p lap-band procedure who initially presented to the ED late tonight with intermittent SOB for the past several days, in conjunction with feeling very weak and fatigued. She decided to come to work but then felt ill again. Her BP was low at 94/54 and she felt dizzy and flushed. She then went to the ED for evaluation. While she was waiting in Triage, she had sudden onset of left facial discomfort and sensation of left shoulder heaviness. LKN was 3:00 AM. Code Stroke was called.   Home medications include ASA.   Past Medical History:  Diagnosis Date  . Chronic venous insufficiency 09/29/2017  . Deep venous thrombosis of lower extremity ([place]) 11/17/2015  . Depression   . History of DVT (deep vein thrombosis)   . Primary hyperparathyroidism ([place]) 11/28/2017  . Sleep difficulties   . Thyroid disease     Past Surgical History:  Procedure Laterality Date  . COLONOSCOPY  2014  . GASTRIC RESTRICTION SURGERY     lap band  . HERNIA REPAIR      Family History  Problem Relation Age of Onset  . Stroke Mother   . Hypertension Mother   . Heart disease Father   . Heart attack Father   . Alcohol abuse Father   . Prostate cancer Father   . Uterine cancer Maternal Grandmother               Social History:  reports that she has never smoked. She has never used smokeless tobacco. She reports that she does not drink alcohol and does not use drugs.  Allergies  Allergen Reactions  .  Latex Rash    Where touched and will spread.  Marland Kitchen Penicillin G Itching    HOME MEDICATIONS:  No current facility-administered medications on file prior to encounter.   Current Outpatient Medications on File Prior to Encounter  Medication Sig Dispense Refill  . albuterol (VENTOLIN HFA) 108 (90 Base) MCG/ACT inhaler Inhale 2 puffs into the lungs every 4 (four) hours as needed for wheezing or shortness of breath (and cough x 3 days, then prn). 1 Inhaler 0  . AMBULATORY NON FORMULARY MEDICATION Knee-high, medium compression, graduated compression stockings. Apply to bilateral lower extremities daily 1 each 0  . aspirin EC 81 MG tablet Take 81 mg by mouth daily.    . celecoxib (CELEBREX) 100 MG capsule 1 tab PO bid or 2 tabs PO QD 60 capsule 2  . Cholecalciferol (VITAMIN D) 125 MCG (5000 UT) CAPS Take 1 capsule by mouth daily.     . Elastic Bandages & Supports (T.E.D. BELOW KNEE/X-LARGE) MISC Apply to lower extremities daily. Remove at bedtime 2 each 0  . FLUoxetine (PROZAC) 20 MG capsule Take 1 capsule (20 mg total) by mouth daily. 30 capsule 3  . gabapentin (NEURONTIN) 300 MG capsule Take 2 capsules (600 mg total) by mouth 3 (three) times daily. 120 capsule 3  . NONFORMULARY OR COMPOUNDED ITEM Knee high medium compression stockings. Wear daily. Remove at bedtime 2 each 0  . terbinafine (LAMISIL) 250 MG tablet Take 1 tablet (250 mg total) by mouth daily. 90 tablet 0  . vitamin B-12 (CYANOCOBALAMIN) 1000 MCG tablet Take 1,000 mcg by mouth daily.     Marland Kitchen zolpidem (AMBIEN) 5 MG tablet Take 1 tablet (5 mg total) by mouth at bedtime as needed. For sleep 30 tablet 3     ROS:                                                                                                                                       As per HPI. Denies any additional symptoms.   Blood pressure (!) 112/54, pulse  65, temperature 97.8 F (36.6 C), temperature source Oral, resp. rate 16, height 5\' 6"  (1.676 m), weight 130.2 kg, last menstrual period 04/24/2014, SpO2 100 %.   General Examination:                                                                                                       Physical Exam  HEENT-  [place]/AT   Lungs- Respirations unlabored Extremities- No edema  Neurological Examination Mental Status: Awake and alert. Fully oriented to circumstance. Speech fluent with intact comprehension. No dysarthria.  Cranial Nerves:  II: Visual fields intact with no extinction to DSS. PERRL.   III,IV, VI: No ptosis. EOMI. No nystagmus.  V,VII: Smile symmetric, facial temp sensation equal bilaterally VIII: Hearing intact to conversation IX,X: No hypophonia XI: Symmetric  XII: Midline tongue extension Motor: Right : Upper extremity   5/5    Left:     Upper extremity   5/5  Lower extremity   5/5     Lower extremity   5/5 No pronator drift Sensory: Temp and light touch intact throughout, bilaterally. No extinction to DSS.  Deep Tendon Reflexes: 1+ and symmetric throughout Cerebellar: No ataxia with FNF bilaterally  Gait: Able to sit and stand with own power. Normal gait and station   Lab Results: Basic Metabolic Panel: Recent Labs  Lab 02/09/20 2326  NA 140  K 4.0  CL 106  CO2 24  GLUCOSE 104*  BUN 11  CREATININE 1.06*  CALCIUM 11.6*    CBC: Recent Labs  Lab 02/09/20 2326  WBC 7.0  HGB 13.7  HCT 45.1  MCV 82.3  PLT 222    Cardiac Enzymes: No results for input(s): CKTOTAL, CKMB, CKMBINDEX, TROPONINI in the last 168 hours.  Lipid Panel: No results for input(s): CHOL, TRIG, HDL, CHOLHDL, VLDL, LDLCALC in the last 168 hours.  Imaging: No results found.  Assessment: 54 year old female with acute onset of left facial discomfort and sensation of left shoulder heaviness.  1. Exam reveals no focal deficit. 2. CT head is normal.  3. DDx for presentation includes atypical  migraine symptoms and anxiety-related symptoms in the setting of hypotension.  4. EKG: Normal sinus rhythm. Left axis deviation. Minimal voltage criteria for LVH, may be normal variant ( R in aVL )  Recommendations: 1. MRI brain.  2. IVF 3. Orthostatics.   Addendum: -- Ionized Ca came back low. Hypocalcemia may be the etiology for the patient's transient sensory symptoms.  -- Neurology will sign off. Please call if there are additional questions.     Electronically signed: Dr. Kerney Elbe 02/10/2020, 4:01 AM

## 2020-02-10 NOTE — ED Notes (Signed)
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 Speech clear. States she has left side face 'pain' but unable to rate pain. No facial asymmetry noted. Left side strength slightly weaker on left side. No drift noted. Dr Dina Rich in for pt eval and calling pt a code stroke.

## 2020-02-10 NOTE — ED Provider Notes (Signed)
[facility] EMERGENCY DEPARTMENT Provider Note   CSN: [id number] Arrival date & time: 02/09/20  2307     History Chief Complaint  Patient presents with  . Dizziness    Cynthia [name] is a 54 y.o. female.  HPI     7:29 AM   Triage nurse requested ED evaluation as patient had onset of left-sided facial numbness and weakness approximately 30 minutes ago in the waiting room.   On my evaluation she is awake and alert.  She states that she feels generally weak but 30 minutes prior to my evaluation had onset of numbness over the left side of the face and tingling of her lips.  She states that she feels like the left side of her body is weaker.  She is right-handed.  This is a 54 year old female with a history of DVT, primary hyperparathyroidism who presented initially with intermittent shortness of breath generalized fatigue and weakness.  She reports that she has felt dizzy over the last several days and lightheaded.  She denies any room spinning dizziness.    Reports a slight headache but is unable to quantify that for me.  She has not been around anybody who has been sick.  She is fully vaccinated against COVID-19.  Level 5 caveat for acuity of condition  Past Medical History:  Diagnosis Date  . Chronic venous insufficiency 09/29/2017  . Deep venous thrombosis of lower extremity ([place]) 11/17/2015  . Depression   . History of DVT (deep vein thrombosis)   . Primary hyperparathyroidism ([place]) 11/28/2017  . Sleep difficulties   . Thyroid disease     Patient Active Problem List   Diagnosis Date Noted  . Depressive disorder 03/18/2019  . Venous insufficiency of both lower extremities 11/11/2018  . Class 3 severe obesity due to excess calories with serious comorbidity and body mass index (BMI) of 45.0 to 49.9 in adult ([place]) 11/11/2018  . Encounter for weight management 11/11/2018  . Shortness of breath 10/23/2018  . Chronic cough 10/23/2018  . Bradycardia with  51-60 beats per minute 10/09/2018  . Laryngospasms 10/09/2018  . Dyspepsia 10/09/2018  . Symptom of wheezing 10/09/2018  . Persistent cough for 3 weeks or longer 10/09/2018  . Bronchospasm, acute 05/27/2018  . Hyperparathyroidism ([place]) 11/28/2017  . Microcytic anemia 11/13/2017  . Low mean corpuscular volume (MCV) 11/13/2017  . Hypercalcemia 11/13/2017  . Sacroiliac joint dysfunction of both sides 10/21/2017  . Lumbar degenerative disc disease 10/21/2017  . Abnormal weight gain 10/21/2017  . Lymphedema 09/29/2017  . Primary osteoarthritis of left knee 09/19/2017  . Peripheral edema 09/19/2017  . Morbid obesity with BMI of 40.0-44.9, adult ([place]) 09/18/2017  . Urinary incontinence 09/18/2017  . History of DVT (deep vein thrombosis)   . Lesion of breast 06/18/2017  . LGSIL on Pap smear of cervix 11/22/2015  . Migraine 11/17/2015  . Vitamin D deficiency 11/17/2015  . Constipation-normal barium enema Sept 2013 02/06/2012  . Lapband APL + HH repair (1 suture) 01/28/2012    Past Surgical History:  Procedure Laterality Date  . COLONOSCOPY  2014  . GASTRIC RESTRICTION SURGERY     lap band  . HERNIA REPAIR       OB History   No obstetric history on file.     Family History  Problem Relation Age of Onset  . Stroke Mother   . Hypertension Mother   . Heart disease Father   . Heart attack Father   . Alcohol abuse Father   .  Prostate cancer Father   . Uterine cancer Maternal Grandmother     Social History   Tobacco Use  . Smoking status: Never Smoker  . Smokeless tobacco: Never Used  Substance Use Topics  . Alcohol use: No  . Drug use: No    Home Medications Prior to Admission medications   Medication Sig Start Date End Date Taking? Authorizing Provider  albuterol (VENTOLIN HFA) 108 (90 Base) MCG/ACT inhaler Inhale 2 puffs into the lungs every 4 (four) hours as needed for wheezing or shortness of breath (and cough x 3 days, then prn). 05/09/18   Rodriguez-Southworth,  Sunday Spillers, PA-C  AMBULATORY NON FORMULARY MEDICATION Knee-high, medium compression, graduated compression stockings. Apply to bilateral lower extremities daily 11/04/17   Trixie Dredge, PA-C  aspirin EC 81 MG tablet Take 81 mg by mouth daily.    [provider]  celecoxib (CELEBREX) 100 MG capsule 1 tab PO bid or 2 tabs PO QD 12/07/19   Early, Coralee Pesa, NP  cephALEXin (KEFLEX) 500 MG capsule Take 1 capsule (500 mg total) by mouth 3 (three) times daily. 02/10/20   Gurpreet Mikhail, Barbette Hair, MD  Cholecalciferol (VITAMIN D) 125 MCG (5000 UT) CAPS Take 1 capsule by mouth daily.  07/11/18   [provider]  Elastic Bandages & Supports (T.E.D. BELOW KNEE/X-LARGE) MISC Apply to lower extremities daily. Remove at bedtime 02/17/18   Trixie Dredge, PA-C  FLUoxetine (PROZAC) 20 MG capsule Take 1 capsule (20 mg total) by mouth daily. 12/07/19   Orma Render, NP  gabapentin (NEURONTIN) 300 MG capsule Take 2 capsules (600 mg total) by mouth 3 (three) times daily. 12/07/19   Orma Render, NP  NONFORMULARY OR COMPOUNDED ITEM Knee high medium compression stockings. Wear daily. Remove at bedtime 08/06/19   Early, Coralee Pesa, NP  terbinafine (LAMISIL) 250 MG tablet Take 1 tablet (250 mg total) by mouth daily. 10/02/19   Wallene Huh, DPM  vitamin B-12 (CYANOCOBALAMIN) 1000 MCG tablet Take 1,000 mcg by mouth daily.     [provider]  zolpidem (AMBIEN) 5 MG tablet Take 1 tablet (5 mg total) by mouth at bedtime as needed. For sleep 12/07/19   Orma Render, NP    Allergies    Latex and Penicillin g  Review of Systems   Review of Systems  Constitutional: Negative for fever.  Eyes: Negative for photophobia.  Respiratory: Positive for shortness of breath.   Cardiovascular: Negative for chest pain.  Gastrointestinal: Negative for abdominal pain, nausea and vomiting.  Neurological: Positive for dizziness, weakness, numbness and headaches. Negative for speech difficulty.  All other  systems reviewed and are negative.   Physical Exam Updated Vital Signs BP 114/69   Pulse (!) 49   Temp 97.8 F (36.6 C) (Oral)   Resp 16   Ht 1.676 m (5\' 6" )   Wt 130.2 kg   LMP 04/24/2014   SpO2 100%   BMI 46.32 kg/m   Physical Exam Vitals and nursing note reviewed.  Constitutional:      Appearance: She is well-developed. She is obese. She is not ill-appearing.  HENT:     Head: Normocephalic and atraumatic.     Nose: Nose normal.     Mouth/Throat:     Mouth: Mucous membranes are moist.  Eyes:     Pupils: Pupils are equal, round, and reactive to light.  Cardiovascular:     Rate and Rhythm: Normal rate and regular rhythm.     Heart sounds: Normal  heart sounds.  Pulmonary:     Effort: Pulmonary effort is normal. No respiratory distress.     Breath sounds: No wheezing.  Abdominal:     General: Bowel sounds are normal.     Palpations: Abdomen is soft.     Tenderness: There is no abdominal tenderness.  Musculoskeletal:     Cervical back: Neck supple.     Right lower leg: No edema.     Left lower leg: No edema.  Skin:    General: Skin is warm and dry.  Neurological:     Mental Status: She is alert and oriented to person, place, and time.     Comments: Cranial nerves II through XII intact, 5 out of 5 strength right upper and lower extremity, 4+ out of 5 strength left grip, no obvious drift, no dysmetria to finger-nose-finger  Psychiatric:        Mood and Affect: Mood normal.     ED Results / Procedures / Treatments   Labs (all labs ordered are listed, but only abnormal results are displayed) Labs Reviewed  BASIC METABOLIC PANEL - Abnormal; Notable for the following components:      Result Value   Glucose, Bld 104 (*)    Creatinine, Ser 1.06 (*)    Calcium 11.6 (*)    All other components within normal limits  CBC - Abnormal; Notable for the following components:   RBC 5.48 (*)    MCH 25.0 (*)    All other components within normal limits  URINALYSIS, ROUTINE  W REFLEX MICROSCOPIC - Abnormal; Notable for the following components:   Color, Urine AMBER (*)    APPearance CLOUDY (*)    Hgb urine dipstick SMALL (*)    Bilirubin Urine SMALL (*)    Protein, ur 100 (*)    Leukocytes,Ua LARGE (*)    WBC, UA >50 (*)    Bacteria, UA MANY (*)    All other components within normal limits  CBC - Abnormal; Notable for the following components:   MCH 25.5 (*)    MCHC 29.7 (*)    Platelets 86 (*)    All other components within normal limits  I-STAT CHEM 8, ED - Abnormal; Notable for the following components:   Chloride 117 (*)    Calcium, Ion 0.94 (*)    TCO2 20 (*)    All other components within normal limits  SARS CORONAVIRUS 2 BY RT PCR (HOSPITAL ORDER, [facility])  URINE CULTURE  ETHANOL  DIFFERENTIAL  RAPID URINE DRUG SCREEN, HOSP PERFORMED  PROTIME-INR  APTT  I-STAT BETA HCG BLOOD, ED ([facility], [facility], [facility] ONLY)  CBG MONITORING, ED    EKG EKG Interpretation  Date/Time:  Tuesday February 09 2020 23:14:33 EDT Ventricular Rate:  77 PR Interval:  166 QRS Duration: 82 QT Interval:  344 QTC Calculation: 389 R Axis:   -32 Text Interpretation: Normal sinus rhythm Left axis deviation Minimal voltage criteria for LVH, may be normal variant ( R in aVL ) Abnormal ECG When compared with ECG of 11/28/2018, No significant change was found Confirmed by Delora Fuel ([id number]) on 02/10/2020 1:15:04 AM   Radiology MR BRAIN WO CONTRAST  Result Date: 02/10/2020 CLINICAL DATA:  Neuro deficit with acute stroke suspected EXAM: MRI HEAD WITHOUT CONTRAST TECHNIQUE: Multiplanar, multiecho pulse sequences of the brain and surrounding structures were obtained without intravenous contrast. COMPARISON:  Head CT from earlier today FINDINGS: Brain: No acute infarction, hemorrhage, hydrocephalus, extra-axial collection or mass lesion.  No white matter disease or atrophy. Vascular: Normal flow voids Skull and upper cervical spine: Normal marrow signal  Sinuses/Orbits: Unremarkable Other: Intermittent motion artifact IMPRESSION: Negative brain MRI.  No acute infarct. Electronically Signed   By: Monte Fantasia M.D.   On: 02/10/2020 06:37   CT HEAD CODE STROKE WO CONTRAST  Result Date: 02/10/2020 CLINICAL DATA:  Code stroke. Initial evaluation for left-sided paresthesias and weakness. EXAM: CT HEAD WITHOUT CONTRAST TECHNIQUE: Contiguous axial images were obtained from the base of the skull through the vertex without intravenous contrast. COMPARISON:  None available. FINDINGS: Brain: Cerebral volume within normal limits. No acute intracranial hemorrhage. No visible acute large vessel territory infarct. No mass lesion, mass effect or midline shift. No hydrocephalus or extra-axial fluid collection. Vascular: No hyperdense vessel. Skull: Scalp soft tissues within normal limits.  Calvarium intact. Sinuses/Orbits: Globes and orbital soft tissues within normal limits. Paranasal sinuses are clear. No mastoid effusion. Other: None. ASPECTS [facility] Stroke Program Early CT Score) - Ganglionic level infarction (caudate, lentiform nuclei, internal capsule, insula, M1-M3 cortex): 7 - Supraganglionic infarction (M4-M6 cortex): 3 Total score (0-10 with 10 being normal): 10 IMPRESSION: 1. Negative head CT. No acute intracranial infarct or other abnormality. 2. ASPECTS is 10. These results were communicated to Dr. Cheral Marker at 4:01 amon 9/22/2021by text page via the [facility] messaging system. Electronically Signed   By: Jeannine Boga M.D.   On: 02/10/2020 04:02    Procedures Procedures (including critical care time)  Medications Ordered in ED Medications  sodium chloride 0.9 % bolus 1,000 mL (1,000 mLs Intravenous Not Given 02/10/20 0531)  LORazepam (ATIVAN) injection 1 mg (1 mg Intravenous Given 02/10/20 0531)    ED Course  I have reviewed the triage vital signs and the nursing notes.  Pertinent labs & imaging results that were available during my care of the  patient were reviewed by me and considered in my medical decision making (see chart for details).    MDM Rules/Calculators/A&P                           Patient presents with generalized weakness.  Reports dizziness and just not feeling well over the last several days.  Had an episode of left-sided paresthesias and headache while in the emergency room waiting room.  She was evaluated by myself.  Given lateralizing symptoms, code stroke was initiated.  She was evaluated by neurology.  They recommend MRI but doubt acute stroke.  In the meantime, further work-up was initiated.  Lab work-up is notable for platelet count of 86.  No significant metabolic derangements.  EKG without acute ischemic changes or arrhythmia.  CT shows no evidence of acute bleed.  MRI obtained.  Patient was given 1 L of fluids.  Urinalysis is concerning for UTI.  On repeat evaluation, patient states she does have urinary symptoms and was concerned she might have a urinary tract infection.  Urine culture was sent.  She will be started on Keflex.  MRI does not show any evidence of acute stroke.  On recheck, patient reports some persistent dizziness but otherwise feels well and is agreeable to plan with discharge and outpatient antibiotics.  After history, exam, and medical workup I feel the patient has been appropriately medically screened and is safe for discharge home. Pertinent diagnoses were discussed with the patient. Patient was given return precautions.   Final Clinical Impression(s) / ED Diagnoses Final diagnoses:  Dizziness  Acute cystitis without hematuria  Paresthesia    Rx / DC Orders ED Discharge Orders         Ordered    cephALEXin (KEFLEX) 500 MG capsule  3 times daily        02/10/20 7159           Merryl Hacker, MD 02/10/20 [phone]

## 2020-02-10 NOTE — Discharge Instructions (Signed)
Today for dizziness.  You have a urinary tract infection.  Make sure that you are staying hydrated and avoiding any medications that may cause increased dizziness.  Take medications for your urinary tract infection as directed.

## 2020-02-12 LAB — URINE CULTURE: Culture: >=100000 — AB

## 2020-02-14 ENCOUNTER — Telehealth (HOSPITAL_BASED_OUTPATIENT_CLINIC_OR_DEPARTMENT_OTHER): Payer: Self-pay | Admitting: Emergency Medicine

## 2020-02-14 NOTE — Telephone Encounter (Signed)
Post ED Visit - Positive Culture Follow-up  Culture report reviewed by antimicrobial stewardship pharmacist: [place] Team []  Elenor Quinones, Pharm.D. []  Heide Guile, Pharm.D., BCPS AQ-ID []  Parks Neptune, Pharm.D., BCPS []  Alycia Rossetti, Pharm.D., BCPS []  [place], Pharm.D., BCPS, AAHIVP []  Legrand Como, Pharm.D., BCPS, AAHIVP []  Salome Arnt, PharmD, BCPS []  Johnnette Gourd, PharmD, BCPS []  Hughes Better, PharmD, BCPS [x]  Duanne Limerick, PharmD []  Laqueta Linden, PharmD, BCPS []  Albertina Parr, PharmD  [place] Team []  Leodis Sias, PharmD []  Lindell Spar, PharmD []  Royetta Asal, PharmD []  Graylin Shiver, Rph []  Rema Fendt) Glennon Mac, PharmD []  Arlyn Dunning, PharmD []  Netta Cedars, PharmD []  Dia Sitter, PharmD []  Leone Haven, PharmD []  Gretta Arab, PharmD []  Theodis Shove, PharmD []  Peggyann Juba, PharmD []  Reuel Boom, PharmD   Positive urine culture Treated with Cephalexin, organism sensitive to the same and no further patient follow-up is required at this time.  Sandi Raveling Zale Marcotte 02/14/2020, 9:27 AM

## 2020-02-15 ENCOUNTER — Ambulatory Visit: Payer: No Typology Code available for payment source | Admitting: Nurse Practitioner

## 2020-02-17 ENCOUNTER — Other Ambulatory Visit: Payer: Self-pay | Admitting: Surgery

## 2020-02-18 NOTE — Patient Instructions (Signed)
DUE TO COVID-19 ONLY ONE VISITOR IS ALLOWED TO COME WITH YOU AND STAY IN THE WAITING ROOM ONLY DURING PRE OP AND PROCEDURE DAY OF SURGERY. THE 1 VISITOR  MAY VISIT WITH YOU AFTER SURGERY IN YOUR PRIVATE ROOM DURING VISITING HOURS ONLY!  YOU NEED TO HAVE A COVID 19 TEST ON_______ @_______ , THIS TEST MUST BE DONE BEFORE SURGERY,  COVID TESTING SITE [address] [place] [address], IT IS ON THE RIGHT GOING OUT [address] APPROXIMATELY  2 MINUTES PAST [employer] ON THE RIGHT. ONCE YOUR COVID TEST IS COMPLETED,  PLEASE BEGIN THE QUARANTINE INSTRUCTIONS AS OUTLINED IN YOUR HANDOUT.                Cynthia [name]  02/18/2020   Your procedure is scheduled on: 02-29-20   Report to [facility] Main  Entrance    Report to Admitting at 9:30 AM     Call this number if you have problems the morning of surgery [phone]    Remember: After Midnight. You may have clear liquids from midnight until 8:30 AM. After 8:30 AM, nothing until after surgery.    CLEAR LIQUID DIET   Foods Allowed                                                                       Coffee and tea, regular and decaf                              Plain Jell-O any favor except red or purple                                            Fruit ices (not with fruit pulp)                                      Iced Popsicles                                     Carbonated beverages, regular and diet                                    Cranberry, grape and apple juices Sports drinks like Gatorade Lightly seasoned clear broth or consume(fat free) Sugar, honey syrup   _____________________________________________________________________       Take these medicines the morning of surgery with A SIP OF WATER: Fluoxetine (Prozac), and Gabapentin (Neurontin)   BRUSH YOUR TEETH MORNING OF SURGERY AND RINSE YOUR MOUTH OUT, NO CHEWING GUM CANDY OR MINTS.                                You may not  have any metal on your body including hair pins and  piercings     Do not wear jewelry, make-up, lotions, powders or perfumes, deodorant              Do not wear nail polish on your fingernails.  Do not shave  48 hours prior to surgery.              Do not bring valuables to the hospital. [place].  Contacts, dentures or bridgework may not be worn into surgery.      Patients discharged the day of surgery will not be allowed to drive home. IF YOU ARE HAVING SURGERY AND GOING HOME THE SAME DAY, YOU MUST HAVE AN ADULT TO DRIVE YOU HOME AND BE WITH YOU FOR 24 HOURS. YOU MAY GO HOME BY TAXI OR UBER OR ORTHERWISE, BUT AN ADULT MUST ACCOMPANY YOU HOME AND STAY WITH YOU FOR 24 HOURS.  Name and phone number of your driver:  Special Instructions: N/A              Please read over the following fact sheets you were given: _____________________________________________________________________  [facility] - Preparing for Surgery Before surgery, you can play an important role.  Because skin is not sterile, your skin needs to be as free of germs as possible.  You can reduce the number of germs on your skin by washing with CHG (chlorahexidine gluconate) soap before surgery.  CHG is an antiseptic cleaner which kills germs and bonds with the skin to continue killing germs even after washing. Please DO NOT use if you have an allergy to CHG or antibacterial soaps.  If your skin becomes reddened/irritated stop using the CHG and inform your nurse when you arrive at Short Stay. Do not shave (including legs and underarms) for at least 48 hours prior to the first CHG shower.  You may shave your face/neck. Please follow these instructions carefully:  1.  Shower with CHG Soap the night before surgery and the  morning of Surgery.  2.  If you choose to wash your hair, wash your hair first as usual with your  normal  shampoo.  3.  After you shampoo, rinse  your hair and body thoroughly to remove the  shampoo.                           4.  Use CHG as you would any other liquid soap.  You can apply chg directly  to the skin and wash                       Gently with a scrungie or clean washcloth.  5.  Apply the CHG Soap to your body ONLY FROM THE NECK DOWN.   Do not use on face/ open                           Wound or open sores. Avoid contact with eyes, ears mouth and genitals (private parts).                       Wash face,  Genitals (private parts) with your normal soap.             6.  Wash thoroughly, paying special attention to the area where your surgery  will be performed.  7.  Thoroughly rinse your body with warm water from the neck down.  8.  DO NOT shower/wash with your normal soap after using and rinsing off  the CHG Soap.                9.  Pat yourself dry with a clean towel.            10.  Wear clean pajamas.            11.  Place clean sheets on your bed the night of your first shower and do not  sleep with pets. Day of Surgery : Do not apply any lotions/deodorants the morning of surgery.  Please wear clean clothes to the hospital/surgery center.  FAILURE TO FOLLOW THESE INSTRUCTIONS MAY RESULT IN THE CANCELLATION OF YOUR SURGERY PATIENT SIGNATURE_________________________________  NURSE SIGNATURE__________________________________  ________________________________________________________________________

## 2020-02-18 NOTE — Progress Notes (Signed)
COVID Vaccine Completed: Date COVID Vaccine completed: COVID vaccine manufacturer: [place]   PCP - Jacolyn Reedy, NP Cardiologist -   Chest x-ray -  EKG - 02-12-20 Stress Test -  ECHO - 11-12-18 Cardiac Cath -  Pacemaker/ICD device last checked:  Sleep Study -  CPAP -   Fasting Blood Sugar -  Checks Blood Sugar _____ times a day  Blood Thinner Instructions: Aspirin Instructions: Last Dose:  Anesthesia review:   Patient denies shortness of breath, fever, cough and chest pain at PAT appointment   Patient verbalized understanding of instructions that were given to them at the PAT appointment. Patient was also instructed that they will need to review over the PAT instructions again at home before surgery.

## 2020-02-22 ENCOUNTER — Encounter: Payer: Self-pay | Admitting: Nurse Practitioner

## 2020-02-24 ENCOUNTER — Encounter (HOSPITAL_COMMUNITY): Payer: Self-pay

## 2020-02-24 ENCOUNTER — Encounter (HOSPITAL_COMMUNITY)
Admission: RE | Admit: 2020-02-24 | Discharge: 2020-02-24 | Disposition: A | Discharge status: Home / Self Care | Payer: No Typology Code available for payment source | Source: Ambulatory Visit | Attending: Surgery | Admitting: Surgery

## 2020-02-24 ENCOUNTER — Other Ambulatory Visit: Payer: Self-pay

## 2020-02-24 DIAGNOSIS — Z01812 Encounter for preprocedural laboratory examination: Secondary | ICD-10-CM | POA: Diagnosis not present

## 2020-02-24 HISTORY — DX: Unspecified asthma, uncomplicated: J45.909

## 2020-02-24 HISTORY — DX: Anemia, unspecified: D64.9

## 2020-02-24 HISTORY — DX: Gastro-esophageal reflux disease without esophagitis: K21.9

## 2020-02-24 HISTORY — DX: Sleep apnea, unspecified: G47.30

## 2020-02-24 HISTORY — DX: Personal history of urinary calculi: Z87.442

## 2020-02-24 LAB — CBC
HCT: 42.7 % (ref 36.0–46.0)
Hemoglobin: 13.3 g/dL (ref 12.0–15.0)
MCH: 25.9 pg — ABNORMAL LOW (ref 26.0–34.0)
MCHC: 31.1 g/dL (ref 30.0–36.0)
MCV: 83.2 fL (ref 80.0–100.0)
Platelets: 194 10*3/uL (ref 150–400)
RBC: 5.13 MIL/uL — ABNORMAL HIGH (ref 3.87–5.11)
RDW: 14.4 % (ref 11.5–15.5)
WBC: 5.5 10*3/uL (ref 4.0–10.5)
nRBC: 0 % (ref 0.0–0.2)

## 2020-02-24 LAB — BASIC METABOLIC PANEL
Anion gap: 12 (ref 5–15)
BUN: 16 mg/dL (ref 6–20)
CO2: 22 mmol/L (ref 22–32)
Calcium: 11.6 mg/dL — ABNORMAL HIGH (ref 8.9–10.3)
Chloride: 106 mmol/L (ref 98–111)
Creatinine, Ser: 0.92 mg/dL (ref 0.44–1.00)
GFR calc non Af Amer: >60 mL/min (ref >60)
Glucose, Bld: 90 mg/dL (ref 70–99)
Potassium: 3.9 mmol/L (ref 3.5–5.1)
Sodium: 140 mmol/L (ref 135–145)

## 2020-02-24 NOTE — Progress Notes (Signed)
COVID Vaccine Completed: Date COVID Vaccine completed: COVID vaccine manufacturer: [place]   PCP - Jacolyn Reedy, NP Cardiologist -   Chest x-ray -  EKG - 02-12-20 Stress Test -  ECHO - 11-12-18 Cardiac Cath -  Pacemaker/ICD device last checked:  Sleep Study -  CPAP -   Fasting Blood Sugar -  Checks Blood Sugar _____ times a day  Blood Thinner Instructions: Aspirin Instructions: Last Dose:  Anesthesia review: OSA mild no mask  Patient denies shortness of breath, fever, cough and chest pain at PAT appointment  none   Patient verbalized understanding of instructions that were given to them at the PAT appointment. Patient was also instructed that they will need to review over the PAT instructions again at home before surgery.

## 2020-02-25 ENCOUNTER — Encounter (HOSPITAL_COMMUNITY): Payer: Self-pay | Admitting: Anesthesiology

## 2020-02-25 ENCOUNTER — Other Ambulatory Visit (HOSPITAL_COMMUNITY)
Admission: RE | Admit: 2020-02-25 | Discharge: 2020-02-25 | Disposition: A | Discharge status: Home / Self Care | Payer: No Typology Code available for payment source | Source: Ambulatory Visit | Attending: Surgery | Admitting: Surgery

## 2020-02-25 ENCOUNTER — Encounter (HOSPITAL_COMMUNITY): Payer: Self-pay | Admitting: Physician Assistant

## 2020-02-25 DIAGNOSIS — Z20822 Contact with and (suspected) exposure to covid-19: Secondary | ICD-10-CM | POA: Insufficient documentation

## 2020-02-25 DIAGNOSIS — Z01818 Encounter for other preprocedural examination: Secondary | ICD-10-CM | POA: Insufficient documentation

## 2020-02-25 LAB — SARS CORONAVIRUS 2 (TAT 6-24 HRS): SARS Coronavirus 2: NEGATIVE

## 2020-02-27 ENCOUNTER — Other Ambulatory Visit (HOSPITAL_COMMUNITY): Payer: Self-pay | Admitting: Gastroenterology

## 2020-02-28 ENCOUNTER — Encounter (HOSPITAL_COMMUNITY): Payer: Self-pay | Admitting: Surgery

## 2020-02-28 DIAGNOSIS — E041 Nontoxic single thyroid nodule: Secondary | ICD-10-CM | POA: Diagnosis present

## 2020-02-28 NOTE — H&P (Signed)
General Surgery - [facility]  Cynthia [name] DOB: 01/17/1966 Single / Language: English / Race: Black or African American Female   History of Present Illness   The patient is a 53 year old female who presents with primary hyperparathyroidism.  CHIEF COMPLAINT: primary hyperparathyroidism  Patient is referred by Dr. Cristina Gherghe for surgical evaluation and management of primary hyperparathyroidism. Patient is followed in our practice after bariatric surgery by Dr. Matthew Martin. Patient was noted to have elevated serum calcium levels. She was seen and evaluated by Dr. Gherghe. On physical examination she was noted to have a palpable thyroid nodule. She underwent ultrasound on September 09, 2019. This demonstrated a solitary mid right thyroid nodule measuring 4.6 cm in size. It was felt to be moderately suspicious and biopsy was recommended. Fine-needle aspiration biopsy on Sep 24, 2019 showed a benign follicular nodule without evidence of malignancy. TSH level is normal at 2.25. Evaluation for hypercalcemia showed several serum calcium levels greater than 11 with her most recent level in April 2021 being 11.5. Intact PTH level has ranged from 116-207. Vitamin D was corrected to normal level of 46. 24-hour urine collection for calcium was obtained and was elevated at 400. Patient has not had any imaging studies. Patient does note chronic fatigue. She complains of bone and joint discomfort. She does have a history of nephrolithiasis. Patient presents today to discuss primary hyperparathyroidism and further evaluation and management. She is a pharmacy technician.   Problem List/Past Medical  GERD (GASTROESOPHAGEAL REFLUX DISEASE) (K21.9)  ENCOUNTER FOR FITTING AND ADJUSTMENT OF GASTRIC LAP BAND (Z46.51)  OBESITY, MORBID, BMI 40.0-49.9 (E66.01)  PRIMARY HYPERPARATHYROIDISM (E21.0)  RIGHT THYROID NODULE (E04.1)  GASTRIC BAND MALFUNCTION (K95.09)    Past Surgical History Gallbladder Surgery - Laparoscopic  Lap Band  Resection of Stomach   Diagnostic Studies History Colonoscopy  within last year Mammogram  >3 years ago Pap Smear  >5 years ago  Allergies  Latex Exam Gloves *MEDICAL DEVICES AND SUPPLIES*  Penicillins  Allergies Reconciled   Medication History  Terbinafine HCl (250MG Tablet, Oral) Active. hydroCHLOROthiazide (12.5MG Tablet, Oral) Active. Celecoxib (100MG Capsule, Oral) Active. Ambien (5MG Tablet, Oral) Active. Vitamin D (25 MCG(1000 UT) Tablet, Oral) Active. B Complex B-12 (Oral) Active. oil of oregano Active. Tylenol (325MG Tablet, Oral as needed) Active. Tums Lasting Effects (500MG Tablet Chewable, Oral as needed) Active. Zantac 150 Maximum Strength (150MG Tablet, Oral daily) Active. Multiple Vit/Minerals/No Iron (Oral daily) Active. Aspirin EC (81MG Tablet DR, Oral daily) Active. Medications Reconciled  Social History  Alcohol use  Occasional alcohol use. Caffeine use  Tea. Tobacco use  Former smoker.  Family History  Alcohol Abuse  Father. Anesthetic complications  Mother. Bleeding disorder  Mother. Breast Cancer  Family Members In General. Cervical Cancer  Family Members In General. Heart disease in female family member before age 65   Pregnancy / Birth History  Age at menarche  10 years. Gravida  1 Irregular periods  Maternal age  21-25  Other Problems  Diverticulosis  Gastroesophageal Reflux Disease  Migraine Headache  Umbilical Hernia Repair   Vitals  Weight: 294.6 lb Height: 66in Body Surface Area: 2.36 m Body Mass Index: 47.55 kg/m  Temp.: 97.3F (Tympanic)  Pulse: 92 (Regular)  BP: 112/72(Sitting, Left Arm, Standard)  Physical Exam   GENERAL APPEARANCE Development: normal Nutritional status: normal Gross deformities: none  SKIN Rash, lesions, ulcers: none Induration, erythema: none Nodules: none  palpable  EYES Conjunctiva and lids: normal Pupils: equal   and reactive Iris: normal bilaterally  EARS, NOSE, MOUTH, THROAT External ears: no lesion or deformity External nose: no lesion or deformity Hearing: grossly normal Due to Covid-19 pandemic, patient is wearing a mask.  NECK Symmetric: no Trachea: midline Thyroid: There is a dominant palpable mass in the right thyroid lobe measuring at least 4 cm in size, discrete, relatively firm, mobile, and nontender. Palpation of the left thyroid lobe shows no significant nodularity. There is no associated lymphadenopathy.  CHEST Respiratory effort: normal Retraction or accessory muscle use: no Breath sounds: normal bilaterally Rales, rhonchi, wheeze: none  CARDIOVASCULAR Auscultation: regular rhythm, normal rate Murmurs: none Pulses: radial pulse 2+ palpable Lower extremity edema: none  MUSCULOSKELETAL Station and gait: normal Digits and nails: no clubbing or cyanosis Muscle strength: grossly normal all extremities Range of motion: grossly normal all extremities Deformity: none  LYMPHATIC Cervical: none palpable Supraclavicular: none palpable  PSYCHIATRIC Oriented to person, place, and time: yes Mood and affect: normal for situation Judgment and insight: appropriate for situation    Assessment & Plan  RIGHT THYROID NODULE (E04.1) PRIMARY HYPERPARATHYROIDISM (E21.0)  Follow Up - Call [facility] office after tests / studies doneto discuss further plans  Patient is referred by her endocrinologist for surgical evaluation for primary hyperparathyroidism.  Patient provided with a copy of "Parathyroid Surgery: Treatment for Your Parathyroid Gland Problem", published by [employer], 12 pages. Book reviewed and explained to patient during visit today.  Patient has biochemical evidence of primary hyperparathyroidism. Laboratory studies are very convincing. She has had symptoms including chronic fatigue, bone and joint discomfort,  and a history of nephrolithiasis. I would like to proceed with imaging studies. She has already had a thyroid ultrasound which documented her right sided thyroid nodule. Parathyroid tissue was not identified. We will arrange for a nuclear medicine parathyroid scan in the near future. The patient and I discussed the study today. If this identifies a parathyroid adenoma, then I believe she would be a good candidate for minimally invasive outpatient surgery. If this study fails to identify the parathyroid adenoma, then we would proceed with a 4D CT scan of the neck.  The patient and I discussed indications for thyroid surgery. At this time, the patient has no absolute indication for thyroid surgery and the dominant nodule in the right side of the thyroid can be safely observed with physical examination and sequential ultrasound scanning.  Patient will undergo a sestamibi scan and we will contact her with results when they're available.  ADDENDUM  Telephone call to patient with results of sestamibi scan from 02-03-2020. Positive for right inferior adenoma.  Will plan minimally invasive out-patient surgery. LMOM for patient to call and confirm that she wants to proceed with surgery and to ask any questions.  The risks and benefits of the procedure have been discussed at length with the patient.  The patient understands the proposed procedure, potential alternative treatments, and the course of recovery to be expected.  All of the patient's questions have been answered at this time.  The patient wishes to proceed with surgery.  Cynthia Wagoner, MD [facility] Office: [phone]  

## 2020-02-29 ENCOUNTER — Ambulatory Visit (HOSPITAL_COMMUNITY)
Admission: RE | Admit: 2020-02-29 | Discharge: 2020-02-29 | Disposition: A | Discharge status: Home / Self Care | Payer: No Typology Code available for payment source | Attending: Surgery | Admitting: Surgery

## 2020-02-29 ENCOUNTER — Encounter (HOSPITAL_COMMUNITY)
Admission: RE | Disposition: A | Discharge status: Home / Self Care | Payer: Self-pay | Source: Home / Self Care | Attending: Surgery

## 2020-02-29 ENCOUNTER — Encounter (HOSPITAL_COMMUNITY): Payer: Self-pay | Admitting: Surgery

## 2020-02-29 DIAGNOSIS — E210 Primary hyperparathyroidism: Secondary | ICD-10-CM | POA: Diagnosis present

## 2020-02-29 DIAGNOSIS — E041 Nontoxic single thyroid nodule: Secondary | ICD-10-CM | POA: Diagnosis present

## 2020-02-29 SURGERY — PARATHYROIDECTOMY
Anesthesia: General | Laterality: Right

## 2020-02-29 MED ORDER — CHLORHEXIDINE GLUCONATE CLOTH 2 % EX PADS: 6.0000 | MEDICATED_PAD | Freq: Once | CUTANEOUS | Status: DC

## 2020-02-29 MED ORDER — CHLORHEXIDINE GLUCONATE 0.12 % MT SOLN: 15.0000 mL | Freq: Once | OROMUCOSAL | Status: DC

## 2020-02-29 MED ORDER — LACTATED RINGERS IV SOLN: INTRAVENOUS | Status: DC

## 2020-02-29 MED ORDER — ORAL CARE MOUTH RINSE: 15.0000 mL | Freq: Once | OROMUCOSAL | Status: DC

## 2020-02-29 MED ORDER — CIPROFLOXACIN IN D5W 400 MG/200ML IV SOLN
400.0000 mg | INTRAVENOUS | Status: DC
  Filled 2020-02-29: qty 200

## 2020-02-29 NOTE — Progress Notes (Signed)
Pt informed us that she went to work after her covid test and did not stay quarantine ; Dr. Smith Robert and Dr. Harlow Asa aware; case cancelled

## 2020-02-29 NOTE — Anesthesia Preprocedure Evaluation (Deleted)
Anesthesia Evaluation    Reviewed: Allergy & Precautions, Patient's Chart, lab work & pertinent test results  Airway        Dental   Pulmonary asthma , sleep apnea ,           Cardiovascular negative cardio ROS       Neuro/Psych  Headaches, PSYCHIATRIC DISORDERS Depression    GI/Hepatic Neg liver ROS, GERD  ,  Endo/Other  negative endocrine ROS  Renal/GU negative Renal ROS     Musculoskeletal  (+) Arthritis ,   Abdominal   Peds  Hematology negative hematology ROS (+)   Anesthesia Other Findings   Reproductive/Obstetrics                             Anesthesia Physical Anesthesia Plan  ASA: II  Anesthesia Plan: General   Post-op Pain Management:    Induction: Intravenous  PONV Risk Score and Plan: 4 or greater and Ondansetron, Dexamethasone, Midazolam and Scopolamine patch - Pre-op  Airway Management Planned: Oral ETT  Additional Equipment: None  Intra-op Plan:   Post-operative Plan: Extubation in OR  Informed Consent:   Plan Discussed with: CRNA  Anesthesia Plan Comments: (Cancelled due to violating quarantine policy. )       Anesthesia Quick Evaluation

## 2020-03-01 ENCOUNTER — Encounter: Payer: Self-pay | Admitting: Dietician

## 2020-03-01 ENCOUNTER — Encounter: Payer: No Typology Code available for payment source | Attending: Surgery | Admitting: Dietician

## 2020-03-01 ENCOUNTER — Other Ambulatory Visit: Payer: Self-pay

## 2020-03-01 DIAGNOSIS — E669 Obesity, unspecified: Secondary | ICD-10-CM | POA: Insufficient documentation

## 2020-03-01 NOTE — Progress Notes (Signed)
Nutrition Assessment for Bariatric Surgery Medical Nutrition Therapy   Patient was seen on 03/01/2020 for Pre-Operative Nutrition Assessment. Letter of approval faxed to [facility] bariatric surgery program coordinator on 03/01/2020.   Referral stated Supervised Weight Loss (SWL) visits needed: 0  Planned surgery: Gastric Band to Sleeve  Pt expectation of surgery: to be healthier, would like to weigh ~200 lbs, to take the stairs without being out of breath   NUTRITION ASSESSMENT   Anthropometrics  Start weight at [facility]: 292.5 lbs (date: 03/01/2020) Height: 66 in BMI: 47.2 kg/m2     Lifestyle & Dietary Hx Patient states she works 3rd shift, 7 days on 7 days off, states this could make it difficult for her to manage consistent meals/snacks due to her work schedule changing week to week. However, states she does meal prepping and that this helps a lot. Typical meal pattern (on off days) is 3 meals per day plus a snack. Drinks lots of fluids, including water, flavored water, sugar-sweetened beverages, and soda. States she and her husband plan to exercise together. States she recently found out   Any previous deficiencies: no  Micronutrient Nutrition Focused Physical Exam: Hair: no issues observed Eyes: no issues observed Mouth: no issues observed Neck: no issues observed Nails: no issues observed Skin: no issues observed  24-Hr Dietary Recall First Meal: frittata + salsa  Snack: - Second Meal: sandwich  Snack: pico de gallo (or guacamole) (or chips)  Third Meal: chicken  Snack: - Beverages: Akaisha Light, [place], [employer], Mtn. Dew, water   NUTRITION DIAGNOSIS  Overweight/obesity ([place]-3.3) related to past poor dietary habits and physical inactivity as evidenced by patient w/ planned Gastric Band to Sleeve surgery following dietary guidelines for continued weight loss.    NUTRITION INTERVENTION  Nutrition counseling (C-1) and education (E-2) to facilitate  bariatric surgery goals.  Pre-Op Goals Reviewed with the Patient . Track food and beverage intake (pen and paper, MyFitness Pal, Baritastic app, etc.) . Make healthy food choices while monitoring portion sizes . Consume 3 meals per day or try to eat every 3-5 hours . Avoid concentrated sugars and fried foods . Keep sugar & fat in the single digits per serving on food labels . Practice CHEWING your food (aim for applesauce consistency) . Practice not drinking 15 minutes before, during, and 30 minutes after each meal and snack . Avoid all carbonated beverages (ex: soda, sparkling beverages)  . Limit caffeinated beverages (ex: coffee, tea, energy drinks) . Avoid all sugar-sweetened beverages (ex: regular soda, sports drinks)  . Avoid alcohol  . Aim for 64-100 ounces of FLUID daily (with at least half of fluid intake being plain water)  . Aim for at least 60-80 grams of PROTEIN daily . Look for a liquid protein source that contains ?15 g protein and ?5 g carbohydrate (ex: shakes, drinks, shots) . Make a list of non-food related activities . Physical activity is an important part of a healthy lifestyle so keep it moving! The goal is to reach 150 minutes of exercise per week, including cardiovascular and weight baring activity.  Handouts Provided Include  . Bariatric Surgery Nutrition Visits . Pre-Op Goals  Learning Style & Readiness for Change Teaching method utilized: Visual & Auditory  Demonstrated degree of understanding via: Teach Back  Barriers to learning/adherence to lifestyle change: None Identified    MONITORING & EVALUATION Dietary intake, weekly physical activity, body weight, and pre-op goals reached at next nutrition visit.   Next Steps Patient is  to follow up at [facility] for Pre-Op Class (>2 weeks before surgery) for further nutrition education.

## 2020-03-28 LAB — HM MAMMOGRAPHY

## 2020-04-09 MED FILL — PEG-3350 SOLUTION: 420 | 1 days supply | Qty: 4000 | Fill #0

## 2020-04-13 NOTE — Patient Instructions (Addendum)
DUE TO COVID-19 ONLY ONE VISITOR IS ALLOWED TO COME WITH YOU AND STAY IN THE WAITING ROOM ONLY DURING PRE OP AND PROCEDURE.   IF YOU WILL BE ADMITTED INTO THE HOSPITAL YOU ARE ALLOWED ONE SUPPORT PERSON DURING VISITATION HOURS ONLY (10AM -8PM)    The support person may change daily.  The support person must pass our screening, gel in and out, and wear a mask at all times, including in the patients room.  Patients must also wear a mask when staff or their support person are in the room.   COVID SWAB TESTING MUST BE COMPLETED ON:  Monday, 04-25-20 @ 3:10 PM   [address] [place], [place] [address]  (Must self quarantine after testing. Follow instructions on handout.)        Your procedure is scheduled on:  Thursday, 04-28-20   Report to [facility] Main  Entrance   Report to Short Stay at 5:30 AM   [facility])   Call this number if you have problems the morning of surgery [phone]   Do not eat food :After Midnight.   May have liquids until 4:30 AM  day of surgery  CLEAR LIQUID DIET  Foods Allowed                                                                     Foods Excluded  Water, Black Coffee and tea, regular and decaf               liquids that you cannot  Plain Jell-O in any flavor  (No red)                                     see through such as: Fruit ices (not with fruit pulp)                                      milk, soups, orange juice              Iced Popsicles (No red)                                      All solid food                                   Apple juices Sports drinks like Gatorade (No red) Lightly seasoned clear broth or consume(fat free) Sugar, honey syrup    Oral Hygiene is also important to reduce your risk of infection.                                    Remember - BRUSH YOUR TEETH THE MORNING OF SURGERY WITH YOUR REGULAR TOOTHPASTE   Do NOT smoke after Midnight   Take these medicines the morning of surgery with A SIP OF  WATER:  Prozac, okay to use  inhalers                               You may not have any metal on your body including hair pins, jewelry, and body piercings             Do not wear make-up, lotions, powders, perfumes/cologne, or deodorant             Do not wear nail polish.  Do not shave  48 hours prior to surgery.    Do not bring valuables to the hospital. [place].   Contacts, dentures or bridgework may not be worn into surgery.   Patients discharged the day of surgery will not be allowed to drive home.               Please read over the following fact sheets you were given: IF YOU HAVE QUESTIONS ABOUT YOUR PRE OP INSTRUCTIONS PLEASE CALL [phone]   [place] - Preparing for Surgery Before surgery, you can play an important role.  Because skin is not sterile, your skin needs to be as free of germs as possible.  You can reduce the number of germs on your skin by washing with CHG (chlorahexidine gluconate) soap before surgery.  CHG is an antiseptic cleaner which kills germs and bonds with the skin to continue killing germs even after washing. Please DO NOT use if you have an allergy to CHG or antibacterial soaps.  If your skin becomes reddened/irritated stop using the CHG and inform your nurse when you arrive at Short Stay. Do not shave (including legs and underarms) for at least 48 hours prior to the first CHG shower.  You may shave your face/neck.  Please follow these instructions carefully:  1.  Shower with CHG Soap the night before surgery and the  morning of surgery.  2.  If you choose to wash your hair, wash your hair first as usual with your normal  shampoo.  3.  After you shampoo, rinse your hair and body thoroughly to remove the shampoo.                             4.  Use CHG as you would any other liquid soap.  You can apply chg directly to the skin and wash.  Gently with a scrungie or clean washcloth.  5.  Apply the CHG Soap to your  body ONLY FROM THE NECK DOWN.   Do   not use on face/ open                           Wound or open sores. Avoid contact with eyes, ears mouth and   genitals (private parts).                       Wash face,  Genitals (private parts) with your normal soap.             6.  Wash thoroughly, paying special attention to the area where your    surgery  will be performed.  7.  Thoroughly rinse your body with warm water from the neck down.  8.  DO NOT shower/wash with your normal soap after using and rinsing off the CHG Soap.  9.  Pat yourself dry with a clean towel.            10.  Wear clean pajamas.            11.  Place clean sheets on your bed the night of your first shower and do not  sleep with pets. Day of Surgery : Do not apply any lotions/deodorants the morning of surgery.  Please wear clean clothes to the hospital/surgery center.  FAILURE TO FOLLOW THESE INSTRUCTIONS MAY RESULT IN THE CANCELLATION OF YOUR SURGERY  PATIENT SIGNATURE_________________________________  NURSE SIGNATURE__________________________________  ________________________________________________________________________

## 2020-04-13 NOTE — Progress Notes (Addendum)
COVID Vaccine Completed:  x2 Date COVID Vaccine completed:  07-10-19 & 07-31-19 COVID vaccine manufacturer: [employer]   PCP -  Cardiologist -   Chest x-ray -  EKG - 02-12-20 in Epic Stress Test -  ECHO - 11-18-18 - In Epic Cardiac Cath -  Pacemaker/ICD device last checked:  Sleep Study - 07-29-12 in Epic, mild sleep apnea CPAP - No  Fasting Blood Sugar -  Checks Blood Sugar _____ times a day  Blood Thinner Instructions: Aspirin Instructions:  ASA 81 mg Last Dose:  Anesthesia review:   Patient denies shortness of breath, fever, cough and chest pain at PAT appointment.   Patient able to climb a flight of stairs and perform ADL's without difficulty   Patient verbalized understanding of instructions that were given to them at the PAT appointment. Patient was also instructed that they will need to review over the PAT instructions again at home before surgery.

## 2020-04-17 ENCOUNTER — Ambulatory Visit: Payer: Self-pay | Admitting: Surgery

## 2020-04-17 ENCOUNTER — Encounter (HOSPITAL_COMMUNITY): Payer: Self-pay | Admitting: Surgery

## 2020-04-17 NOTE — H&P (Signed)
General Surgery [facility]  Cynthia [name] DOB: 12-May-1966 Single / Language: Cleophus Molt / Race: Black or African American Female   History of Present Illness   The patient is a 54 year old female who presents with primary hyperparathyroidism.  CHIEF COMPLAINT: primary hyperparathyroidism  Patient is referred by Dr. Philemon Kingdom for surgical evaluation and management of primary hyperparathyroidism. Patient is followed in our practice after bariatric surgery by Dr. Johnathan Hausen. Patient was noted to have elevated serum calcium levels. She was seen and evaluated by Dr. Cruzita Lederer. On physical examination she was noted to have a palpable thyroid nodule. She underwent ultrasound on September 09, 2019. This demonstrated a solitary mid right thyroid nodule measuring 4.6 cm in size. It was felt to be moderately suspicious and biopsy was recommended. Fine-needle aspiration biopsy on Sep 24, 2019 showed a benign follicular nodule without evidence of malignancy. TSH level is normal at 2.25. Evaluation for hypercalcemia showed several serum calcium levels greater than 11 with her most recent level in April 2021 being 11.5. Intact PTH level has ranged from 116-207. Vitamin D was corrected to normal level of 46. 24-hour urine collection for calcium was obtained and was elevated at 400. Patient has not had any imaging studies. Patient does note chronic fatigue. She complains of bone and joint discomfort. She does have a history of nephrolithiasis. Patient presents today to discuss primary hyperparathyroidism and further evaluation and management. She is a Education administrator.   Problem List/Past Medical  GERD (GASTROESOPHAGEAL REFLUX DISEASE) (K21.9)  ENCOUNTER FOR FITTING AND ADJUSTMENT OF GASTRIC LAP BAND (Z46.51)  OBESITY, MORBID, BMI 40.0-49.9 (E66.01)  PRIMARY HYPERPARATHYROIDISM (E21.0)  RIGHT THYROID NODULE (E04.1)  GASTRIC BAND MALFUNCTION (K95.09)    Past Surgical History Gallbladder Surgery - Laparoscopic  Lap Band  Resection of Stomach   Diagnostic Studies History Colonoscopy  within last year Mammogram  >3 years ago Pap Smear  >5 years ago  Allergies  Latex Exam Gloves *MEDICAL DEVICES AND SUPPLIES*  Penicillins  Allergies Reconciled   Medication History  Terbinafine HCl (250MG  Tablet, Oral) Active. hydroCHLOROthiazide (12.5MG  Tablet, Oral) Active. Celecoxib (100MG  Capsule, Oral) Active. Ambien (5MG  Tablet, Oral) Active. Vitamin D (25 MCG(1000 UT) Tablet, Oral) Active. B Complex B-12 (Oral) Active. oil of oregano Active. Tylenol (325MG  Tablet, Oral as needed) Active. Tums Lasting Effects (500MG  Tablet Chewable, Oral as needed) Active. Zantac 150 Maximum Strength (150MG  Tablet, Oral daily) Active. Multiple Vit/Minerals/No Iron (Oral daily) Active. Aspirin EC (81MG  Tablet DR, Oral daily) Active. Medications Reconciled  Social History  Alcohol use  Occasional alcohol use. Caffeine use  Tea. Tobacco use  Former smoker.  Family History  Alcohol Abuse  Father. Anesthetic complications  Mother. Bleeding disorder  Mother. Breast Cancer  Family Members In General. Cervical Cancer  Family Members In General. Heart disease in female family member before age 56   Pregnancy / Birth History  Age at menarche  78 years. Gravida  1 Irregular periods  Maternal age  26-25  Other Problems  Diverticulosis  Gastroesophageal Reflux Disease  Migraine Headache  Umbilical Hernia Repair   Vitals  Weight: 294.6 lb Height: 66in Body Surface Area: 2.36 m Body Mass Index: 47.55 kg/m  Temp.: 97.28F (Tympanic)  Pulse: 92 (Regular)  BP: 112/72(Sitting, Left Arm, Standard)  Physical Exam   GENERAL APPEARANCE Development: normal Nutritional status: normal Gross deformities: none  SKIN Rash, lesions, ulcers: none Induration, erythema: none Nodules: none  palpable  EYES Conjunctiva and lids: normal Pupils: equal  and reactive Iris: normal bilaterally  EARS, NOSE, MOUTH, THROAT External ears: no lesion or deformity External nose: no lesion or deformity Hearing: grossly normal Due to Covid-19 pandemic, patient is wearing a mask.  NECK Symmetric: no Trachea: midline Thyroid: There is a dominant palpable mass in the right thyroid lobe measuring at least 4 cm in size, discrete, relatively firm, mobile, and nontender. Palpation of the left thyroid lobe shows no significant nodularity. There is no associated lymphadenopathy.  CHEST Respiratory effort: normal Retraction or accessory muscle use: no Breath sounds: normal bilaterally Rales, rhonchi, wheeze: none  CARDIOVASCULAR Auscultation: regular rhythm, normal rate Murmurs: none Pulses: radial pulse 2+ palpable Lower extremity edema: none  MUSCULOSKELETAL Station and gait: normal Digits and nails: no clubbing or cyanosis Muscle strength: grossly normal all extremities Range of motion: grossly normal all extremities Deformity: none  LYMPHATIC Cervical: none palpable Supraclavicular: none palpable  PSYCHIATRIC Oriented to person, place, and time: yes Mood and affect: normal for situation Judgment and insight: appropriate for situation    Assessment & Plan  RIGHT THYROID NODULE (E04.1) PRIMARY HYPERPARATHYROIDISM (E21.0)  Follow Up - Call [facility] office after tests / studies doneto discuss further plans  Patient is referred by her endocrinologist for surgical evaluation for primary hyperparathyroidism.  Patient provided with a copy of "Parathyroid Surgery: Treatment for Your Parathyroid Gland Problem", published by [employer], 12 pages. Book reviewed and explained to patient during visit today.  Patient has biochemical evidence of primary hyperparathyroidism. Laboratory studies are very convincing. She has had symptoms including chronic fatigue, bone and joint discomfort,  and a history of nephrolithiasis. I would like to proceed with imaging studies. She has already had a thyroid ultrasound which documented her right sided thyroid nodule. Parathyroid tissue was not identified. We will arrange for a nuclear medicine parathyroid scan in the near future. The patient and I discussed the study today. If this identifies a parathyroid adenoma, then I believe she would be a good candidate for minimally invasive outpatient surgery. If this study fails to identify the parathyroid adenoma, then we would proceed with a 4D CT scan of the neck.  The patient and I discussed indications for thyroid surgery. At this time, the patient has no absolute indication for thyroid surgery and the dominant nodule in the right side of the thyroid can be safely observed with physical examination and sequential ultrasound scanning.  Patient will undergo a sestamibi scan and we will contact her with results when they're available.  ADDENDUM  Telephone call to patient with results of sestamibi scan from 02-03-2020. Positive for right inferior adenoma.  Will plan minimally invasive out-patient surgery. LMOM for patient to call and confirm that she wants to proceed with surgery and to ask any questions.  The risks and benefits of the procedure have been discussed at length with the patient.  The patient understands the proposed procedure, potential alternative treatments, and the course of recovery to be expected.  All of the patient's questions have been answered at this time.  The patient wishes to proceed with surgery.  Armandina Gemma, [facility] Office: [phone]

## 2020-04-18 ENCOUNTER — Encounter (HOSPITAL_COMMUNITY)
Admission: RE | Admit: 2020-04-18 | Discharge: 2020-04-18 | Disposition: A | Discharge status: Home / Self Care | Payer: No Typology Code available for payment source | Source: Ambulatory Visit | Attending: Surgery | Admitting: Surgery

## 2020-04-18 ENCOUNTER — Other Ambulatory Visit: Payer: Self-pay

## 2020-04-18 DIAGNOSIS — Z01812 Encounter for preprocedural laboratory examination: Secondary | ICD-10-CM | POA: Insufficient documentation

## 2020-04-18 LAB — BASIC METABOLIC PANEL
Anion gap: 9 (ref 5–15)
BUN: 13 mg/dL (ref 6–20)
CO2: 25 mmol/L (ref 22–32)
Calcium: 10.8 mg/dL — ABNORMAL HIGH (ref 8.9–10.3)
Chloride: 110 mmol/L (ref 98–111)
Creatinine, Ser: 0.88 mg/dL (ref 0.44–1.00)
GFR, Estimated: >60 mL/min (ref >60)
Glucose, Bld: 81 mg/dL (ref 70–99)
Potassium: 3.7 mmol/L (ref 3.5–5.1)
Sodium: 144 mmol/L (ref 135–145)

## 2020-04-18 LAB — CBC
HCT: 41.6 % (ref 36.0–46.0)
Hemoglobin: 12.8 g/dL (ref 12.0–15.0)
MCH: 25.3 pg — ABNORMAL LOW (ref 26.0–34.0)
MCHC: 30.8 g/dL (ref 30.0–36.0)
MCV: 82.2 fL (ref 80.0–100.0)
Platelets: 221 10*3/uL (ref 150–400)
RBC: 5.06 MIL/uL (ref 3.87–5.11)
RDW: 14.7 % (ref 11.5–15.5)
WBC: 6.2 10*3/uL (ref 4.0–10.5)
nRBC: 0 % (ref 0.0–0.2)

## 2020-04-18 IMAGING — DX DG SACRUM/COCCYX 2+V
3 series · 3 of 3 positions shown · non-contrast
Comparison: 05/03/2014 radiographs

CLINICAL DATA: Acute sacral and coccyx pain for 1 month. Initial
encounter.

EXAM:
SACRUM AND COCCYX - 2+ VIEW

[coccyx ap]
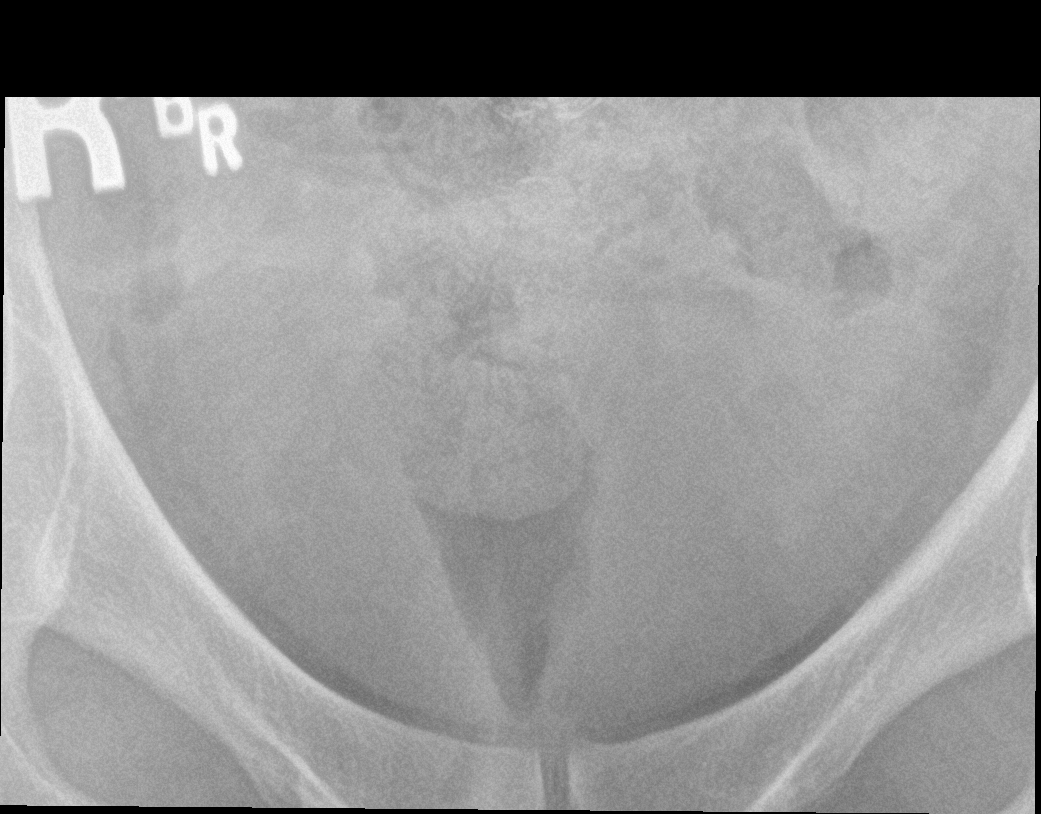

[sacrum ap]
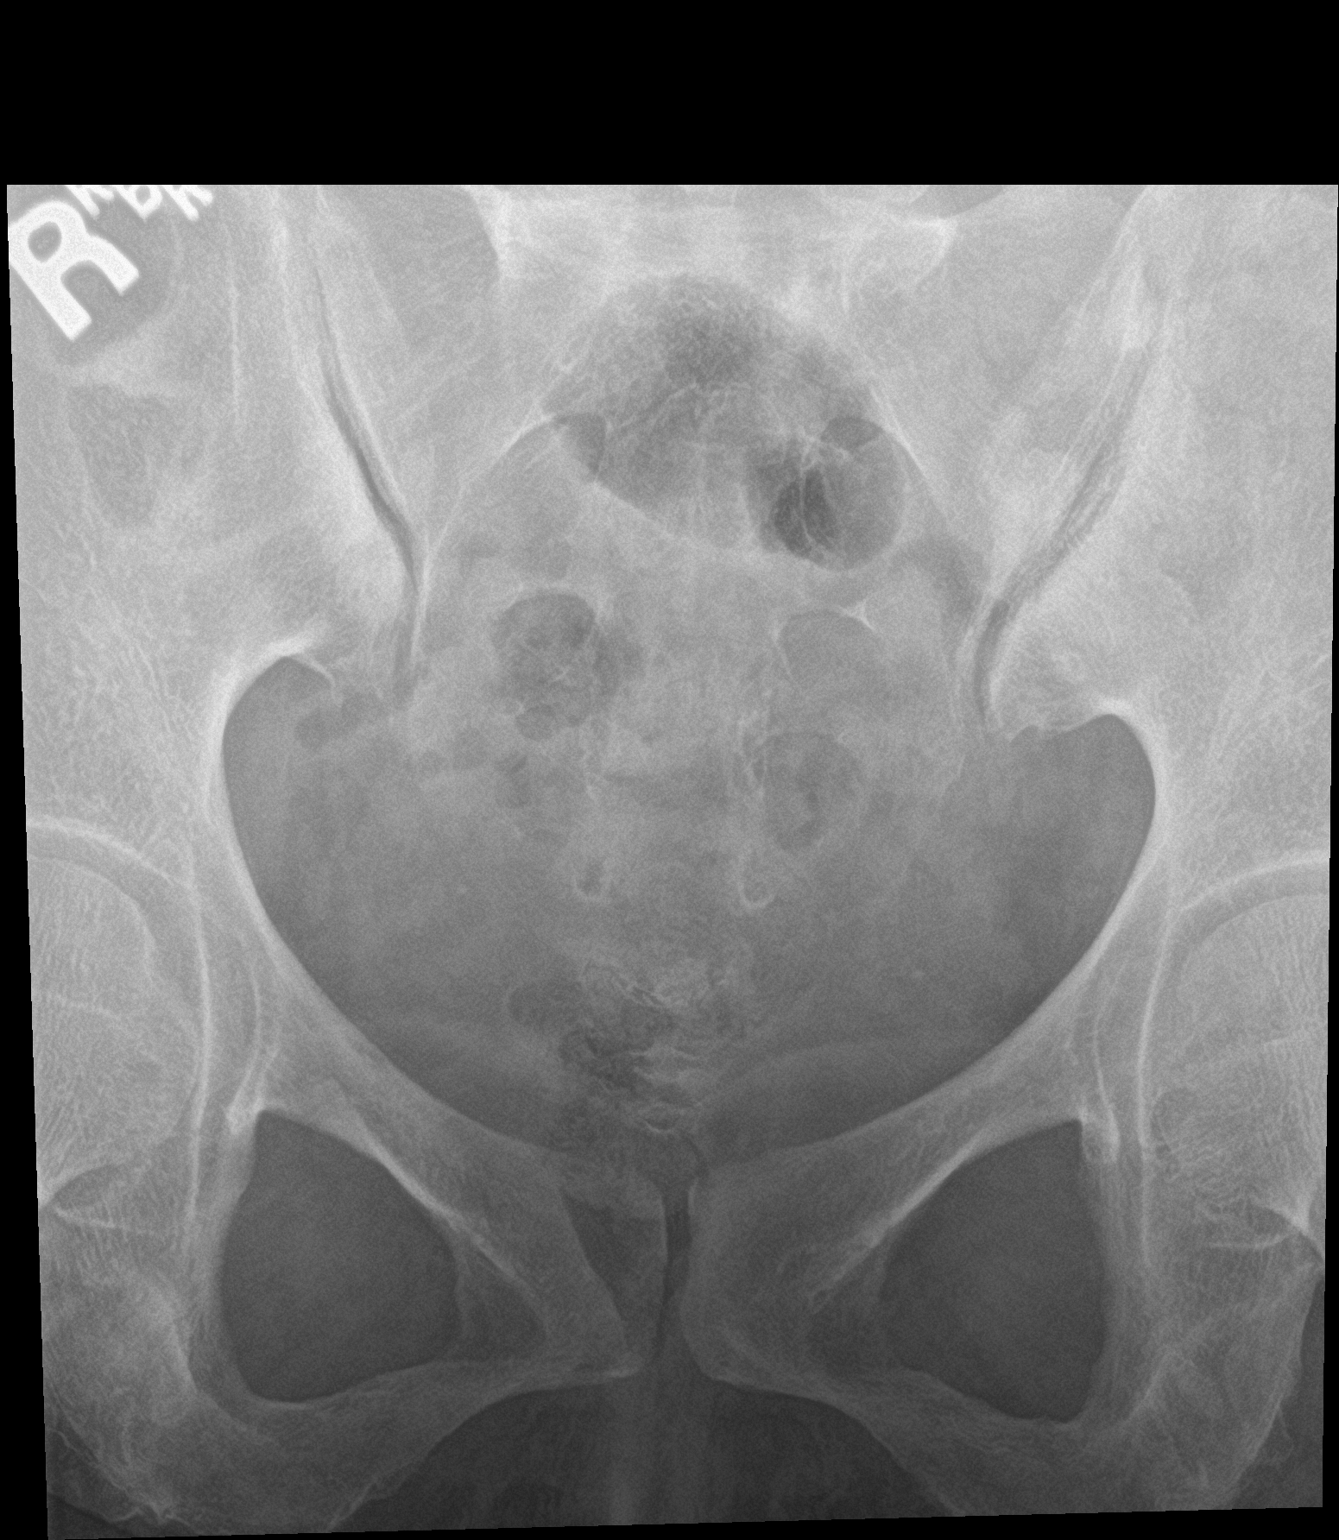

[sacrum lat]
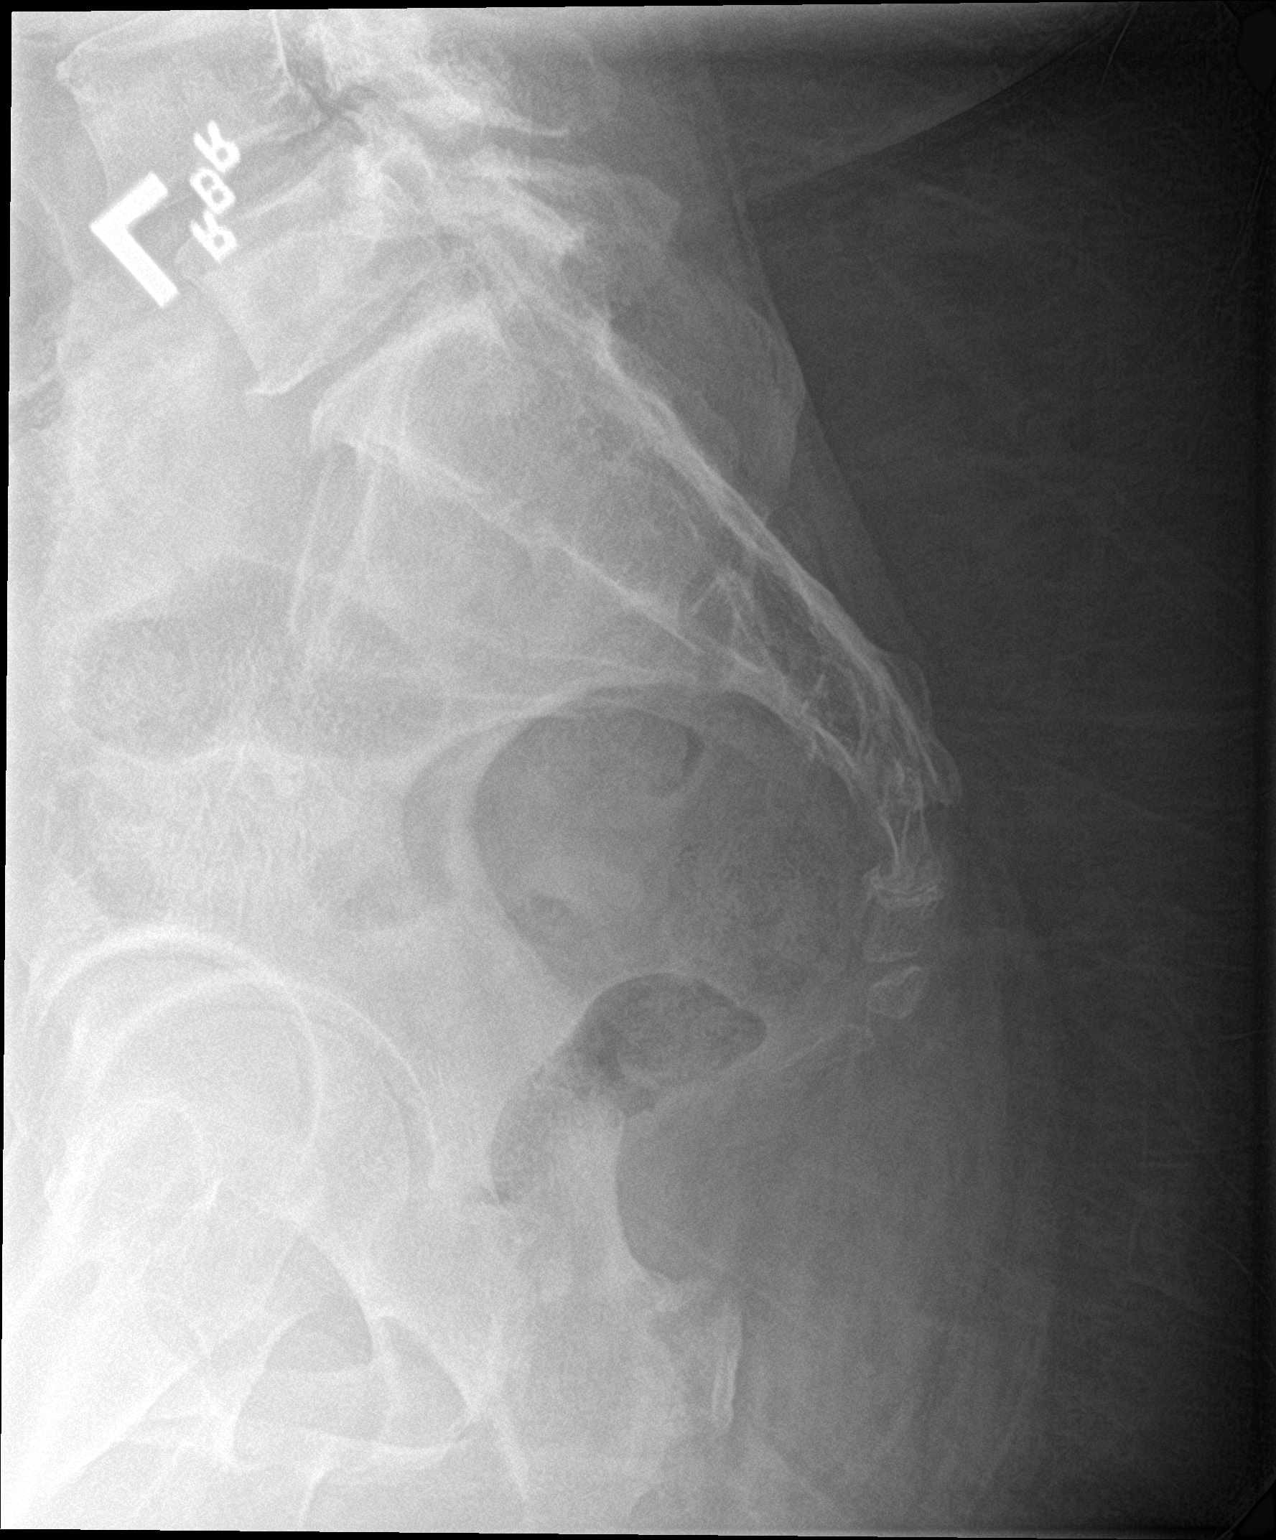

[3 of 3 positions shown; findings below may reference images not displayed]

FINDINGS: No fracture or focal bony lesions.

Mild degenerative changes in the SI joints noted.

Degenerative changes in the LOWER lumbar spine again noted..
IMPRESSION: Unremarkable sacrum/coccyx.

Degenerative changes in the LOWER lumbar spine.

## 2020-04-23 ENCOUNTER — Encounter (HOSPITAL_COMMUNITY): Payer: Self-pay | Admitting: Surgery

## 2020-04-25 ENCOUNTER — Other Ambulatory Visit (HOSPITAL_COMMUNITY)
Admission: RE | Admit: 2020-04-25 | Discharge: 2020-04-25 | Disposition: A | Discharge status: Home / Self Care | Payer: No Typology Code available for payment source | Source: Ambulatory Visit | Attending: Surgery | Admitting: Surgery

## 2020-04-25 DIAGNOSIS — Z20822 Contact with and (suspected) exposure to covid-19: Secondary | ICD-10-CM | POA: Diagnosis not present

## 2020-04-25 DIAGNOSIS — Z01812 Encounter for preprocedural laboratory examination: Secondary | ICD-10-CM | POA: Insufficient documentation

## 2020-04-25 LAB — SARS CORONAVIRUS 2 (TAT 6-24 HRS): SARS Coronavirus 2: NEGATIVE

## 2020-04-27 NOTE — Anesthesia Preprocedure Evaluation (Addendum)
Anesthesia Evaluation  Patient identified by MRN, date of birth, ID band Patient awake    Reviewed: Allergy & Precautions, NPO status , Patient's Chart, lab work & pertinent test results  History of Anesthesia Complications Negative for: history of anesthetic complications  Airway Mallampati: II  TM Distance: >3 FB Neck ROM: Full    Dental  (+) Dental Advisory Given   Pulmonary sleep apnea (does not require CPAP) , COPD,  COPD inhaler,  04/25/2020 SARS coronavirus NEG   breath sounds clear to auscultation       Cardiovascular (-) angina+ DVT   Rhythm:Regular Rate:Normal  '20 ECHO: EF 60-65%, no significant valvular abnormalities   Neuro/Psych  Headaches, Depression    GI/Hepatic Neg liver ROS, GERD  Controlled and Medicated,S/p gastric restrictive surgery   Endo/Other  Morbid obesity  Renal/GU negative Renal ROS     Musculoskeletal   Abdominal (+) + obese,   Peds  Hematology negative hematology ROS (+)   Anesthesia Other Findings   Reproductive/Obstetrics                            Anesthesia Physical Anesthesia Plan  ASA: III  Anesthesia Plan: General   Post-op Pain Management:    Induction:   PONV Risk Score and Plan: 3 and Ondansetron, Dexamethasone and Scopolamine patch - Pre-op  Airway Management Planned: Oral ETT  Additional Equipment: None  Intra-op Plan:   Post-operative Plan: Extubation in OR  Informed Consent: I have reviewed the patients History and Physical, chart, labs and discussed the procedure including the risks, benefits and alternatives for the proposed anesthesia with the patient or authorized representative who has indicated his/her understanding and acceptance.     Dental advisory given  Plan Discussed with: CRNA and Surgeon  Anesthesia Plan Comments:        Anesthesia Quick Evaluation

## 2020-04-28 ENCOUNTER — Ambulatory Visit (HOSPITAL_COMMUNITY): Payer: No Typology Code available for payment source | Admitting: Anesthesiology

## 2020-04-28 ENCOUNTER — Ambulatory Visit (HOSPITAL_COMMUNITY)
Admission: RE | Admit: 2020-04-28 | Discharge: 2020-04-29 | Disposition: A | Discharge status: Home / Self Care | Payer: No Typology Code available for payment source | Attending: Surgery | Admitting: Surgery

## 2020-04-28 ENCOUNTER — Encounter (HOSPITAL_COMMUNITY): Payer: Self-pay | Admitting: Surgery

## 2020-04-28 ENCOUNTER — Encounter (HOSPITAL_COMMUNITY)
Admission: RE | Disposition: A | Discharge status: Home / Self Care | Payer: Self-pay | Source: Home / Self Care | Attending: Surgery

## 2020-04-28 DIAGNOSIS — E210 Primary hyperparathyroidism: Secondary | ICD-10-CM

## 2020-04-28 DIAGNOSIS — Z791 Long term (current) use of non-steroidal anti-inflammatories (NSAID): Secondary | ICD-10-CM | POA: Diagnosis not present

## 2020-04-28 DIAGNOSIS — Z87891 Personal history of nicotine dependence: Secondary | ICD-10-CM | POA: Insufficient documentation

## 2020-04-28 DIAGNOSIS — Z7982 Long term (current) use of aspirin: Secondary | ICD-10-CM | POA: Insufficient documentation

## 2020-04-28 DIAGNOSIS — E213 Hyperparathyroidism, unspecified: Secondary | ICD-10-CM | POA: Insufficient documentation

## 2020-04-28 DIAGNOSIS — Z87442 Personal history of urinary calculi: Secondary | ICD-10-CM | POA: Insufficient documentation

## 2020-04-28 DIAGNOSIS — Z79899 Other long term (current) drug therapy: Secondary | ICD-10-CM | POA: Insufficient documentation

## 2020-04-28 DIAGNOSIS — Z9884 Bariatric surgery status: Secondary | ICD-10-CM | POA: Diagnosis not present

## 2020-04-28 DIAGNOSIS — Z91040 Latex allergy status: Secondary | ICD-10-CM | POA: Insufficient documentation

## 2020-04-28 DIAGNOSIS — Z880 Allergy status to penicillin: Secondary | ICD-10-CM | POA: Diagnosis not present

## 2020-04-28 DIAGNOSIS — E041 Nontoxic single thyroid nodule: Secondary | ICD-10-CM | POA: Insufficient documentation

## 2020-04-28 HISTORY — PX: PARATHYROIDECTOMY: SHX19

## 2020-04-28 HISTORY — PX: THYROID LOBECTOMY: SHX420

## 2020-04-28 SURGERY — PARATHYROIDECTOMY
Anesthesia: General | Site: Neck | Laterality: Right

## 2020-04-28 MED ORDER — MIDAZOLAM HCL 2 MG/2ML IJ SOLN: 0.5000 mg | Freq: Once | INTRAMUSCULAR | Status: DC | PRN

## 2020-04-28 MED ORDER — PHENYLEPHRINE HCL-NACL 10-0.9 MG/250ML-% IV SOLN
INTRAVENOUS | Status: DC | PRN
  Administered 2020-04-28: 50 ug/min via INTRAVENOUS

## 2020-04-28 MED ORDER — TERBINAFINE HCL 250 MG PO TABS
250.0000 mg | ORAL_TABLET | Freq: Every day | ORAL | Status: DC
  Administered 2020-04-28 – 2020-04-29 (×2): 250 mg via ORAL
  Filled 2020-04-28 (×2): qty 1

## 2020-04-28 MED ORDER — PHENYLEPHRINE HCL (PRESSORS) 10 MG/ML IV SOLN
INTRAVENOUS | Status: AC
  Filled 2020-04-28: qty 1

## 2020-04-28 MED ORDER — OXYCODONE HCL 5 MG PO TABS: 5.0000 mg | ORAL_TABLET | Freq: Once | ORAL | Status: DC | PRN

## 2020-04-28 MED ORDER — DEXAMETHASONE SODIUM PHOSPHATE 10 MG/ML IJ SOLN
INTRAMUSCULAR | Status: DC | PRN
  Administered 2020-04-28: 5 mg via INTRAVENOUS

## 2020-04-28 MED ORDER — PROPOFOL 10 MG/ML IV BOLUS
INTRAVENOUS | Status: AC
  Filled 2020-04-28: qty 20

## 2020-04-28 MED ORDER — ORAL CARE MOUTH RINSE: 15.0000 mL | Freq: Once | OROMUCOSAL | Status: AC

## 2020-04-28 MED ORDER — LACTATED RINGERS IV SOLN: INTRAVENOUS | Status: DC

## 2020-04-28 MED ORDER — EPHEDRINE SULFATE 50 MG/ML IJ SOLN
INTRAMUSCULAR | Status: DC | PRN
  Administered 2020-04-28: 10 mg via INTRAVENOUS

## 2020-04-28 MED ORDER — SUGAMMADEX SODIUM 200 MG/2ML IV SOLN
INTRAVENOUS | Status: DC | PRN
  Administered 2020-04-28: 200 mg via INTRAVENOUS

## 2020-04-28 MED ORDER — OXYCODONE HCL 5 MG/5ML PO SOLN: 5.0000 mg | Freq: Once | ORAL | Status: DC | PRN

## 2020-04-28 MED ORDER — 0.9 % SODIUM CHLORIDE (POUR BTL) OPTIME
TOPICAL | Status: DC | PRN
  Administered 2020-04-28: 1000 mL

## 2020-04-28 MED ORDER — CHLORHEXIDINE GLUCONATE 0.12 % MT SOLN
15.0000 mL | Freq: Once | OROMUCOSAL | Status: AC
  Administered 2020-04-28: 15 mL via OROMUCOSAL

## 2020-04-28 MED ORDER — FENTANYL CITRATE (PF) 100 MCG/2ML IJ SOLN
INTRAMUSCULAR | Status: DC | PRN
  Administered 2020-04-28 (×2): 50 ug via INTRAVENOUS
  Administered 2020-04-28: 100 ug via INTRAVENOUS

## 2020-04-28 MED ORDER — HYDROMORPHONE HCL 1 MG/ML IJ SOLN: 0.2500 mg | INTRAMUSCULAR | Status: DC | PRN

## 2020-04-28 MED ORDER — MIDAZOLAM HCL 5 MG/5ML IJ SOLN
INTRAMUSCULAR | Status: DC | PRN
  Administered 2020-04-28: 2 mg via INTRAVENOUS

## 2020-04-28 MED ORDER — KETAMINE HCL 50 MG/ML IJ SOLN
INTRAMUSCULAR | Status: DC | PRN
  Administered 2020-04-28: 25 mg via INTRAMUSCULAR

## 2020-04-28 MED ORDER — SODIUM CHLORIDE 0.45 % IV SOLN: INTRAVENOUS | Status: DC

## 2020-04-28 MED ORDER — PROMETHAZINE HCL 25 MG/ML IJ SOLN
6.2500 mg | INTRAMUSCULAR | Status: DC | PRN
  Administered 2020-04-28: 12.5 mg via INTRAVENOUS

## 2020-04-28 MED ORDER — SUCCINYLCHOLINE CHLORIDE 200 MG/10ML IV SOSY
PREFILLED_SYRINGE | INTRAVENOUS | Status: AC
  Filled 2020-04-28: qty 10

## 2020-04-28 MED ORDER — ONDANSETRON HCL 4 MG/2ML IJ SOLN
4.0000 mg | Freq: Four times a day (QID) | INTRAMUSCULAR | Status: DC | PRN
  Administered 2020-04-28: 4 mg via INTRAVENOUS
  Filled 2020-04-28: qty 2

## 2020-04-28 MED ORDER — TRAMADOL HCL 50 MG PO TABS
50.0000 mg | ORAL_TABLET | Freq: Four times a day (QID) | ORAL | Status: DC | PRN
  Administered 2020-04-28: 50 mg via ORAL
  Filled 2020-04-28: qty 1

## 2020-04-28 MED ORDER — SCOPOLAMINE 1 MG/3DAYS TD PT72
1.0000 | MEDICATED_PATCH | TRANSDERMAL | Status: DC
  Administered 2020-04-28: 1.5 mg via TRANSDERMAL
  Filled 2020-04-28: qty 1

## 2020-04-28 MED ORDER — ACETAMINOPHEN 500 MG PO TABS
1000.0000 mg | ORAL_TABLET | Freq: Once | ORAL | Status: AC
  Administered 2020-04-28: 1000 mg via ORAL
  Filled 2020-04-28: qty 2

## 2020-04-28 MED ORDER — KETAMINE HCL 10 MG/ML IJ SOLN
INTRAMUSCULAR | Status: AC
  Filled 2020-04-28: qty 1

## 2020-04-28 MED ORDER — ACETAMINOPHEN 325 MG PO TABS
650.0000 mg | ORAL_TABLET | Freq: Four times a day (QID) | ORAL | Status: DC | PRN
  Administered 2020-04-29: 650 mg via ORAL
  Filled 2020-04-28: qty 2

## 2020-04-28 MED ORDER — MIDAZOLAM HCL 2 MG/2ML IJ SOLN
INTRAMUSCULAR | Status: AC
  Filled 2020-04-28: qty 2

## 2020-04-28 MED ORDER — DEXAMETHASONE SODIUM PHOSPHATE 10 MG/ML IJ SOLN
INTRAMUSCULAR | Status: AC
  Filled 2020-04-28: qty 1

## 2020-04-28 MED ORDER — MENTHOL 3 MG MT LOZG: 1.0000 | LOZENGE | OROMUCOSAL | Status: DC | PRN

## 2020-04-28 MED ORDER — ONDANSETRON HCL 4 MG/2ML IJ SOLN
INTRAMUSCULAR | Status: AC
  Filled 2020-04-28: qty 2

## 2020-04-28 MED ORDER — OXYCODONE HCL 5 MG PO TABS
5.0000 mg | ORAL_TABLET | ORAL | Status: DC | PRN
  Filled 2020-04-28: qty 2

## 2020-04-28 MED ORDER — ACETAMINOPHEN 650 MG RE SUPP: 650.0000 mg | Freq: Four times a day (QID) | RECTAL | Status: DC | PRN

## 2020-04-28 MED ORDER — LIDOCAINE HCL (CARDIAC) PF 100 MG/5ML IV SOSY
PREFILLED_SYRINGE | INTRAVENOUS | Status: DC | PRN
  Administered 2020-04-28: 40 mg via INTRAVENOUS

## 2020-04-28 MED ORDER — MEPERIDINE HCL 50 MG/ML IJ SOLN: 6.2500 mg | INTRAMUSCULAR | Status: DC | PRN

## 2020-04-28 MED ORDER — HYDROMORPHONE HCL 1 MG/ML IJ SOLN: 1.0000 mg | INTRAMUSCULAR | Status: DC | PRN

## 2020-04-28 MED ORDER — CHLORHEXIDINE GLUCONATE CLOTH 2 % EX PADS: 6.0000 | MEDICATED_PAD | Freq: Once | CUTANEOUS | Status: DC

## 2020-04-28 MED ORDER — FENTANYL CITRATE (PF) 250 MCG/5ML IJ SOLN
INTRAMUSCULAR | Status: AC
  Filled 2020-04-28: qty 5

## 2020-04-28 MED ORDER — PROPOFOL 10 MG/ML IV BOLUS
INTRAVENOUS | Status: DC | PRN
  Administered 2020-04-28: 120 mg via INTRAVENOUS

## 2020-04-28 MED ORDER — ONDANSETRON HCL 4 MG/2ML IJ SOLN
INTRAMUSCULAR | Status: DC | PRN
  Administered 2020-04-28: 4 mg via INTRAVENOUS

## 2020-04-28 MED ORDER — PROMETHAZINE HCL 25 MG/ML IJ SOLN
INTRAMUSCULAR | Status: AC
  Filled 2020-04-28: qty 1

## 2020-04-28 MED ORDER — LIDOCAINE HCL (PF) 2 % IJ SOLN
INTRAMUSCULAR | Status: AC
  Filled 2020-04-28: qty 5

## 2020-04-28 MED ORDER — FLUOXETINE HCL 20 MG PO CAPS
20.0000 mg | ORAL_CAPSULE | Freq: Every day | ORAL | Status: DC
  Administered 2020-04-29: 20 mg via ORAL
  Filled 2020-04-28: qty 1

## 2020-04-28 MED ORDER — ONDANSETRON 4 MG PO TBDP: 4.0000 mg | ORAL_TABLET | Freq: Four times a day (QID) | ORAL | Status: DC | PRN

## 2020-04-28 MED ORDER — CIPROFLOXACIN IN D5W 400 MG/200ML IV SOLN
400.0000 mg | INTRAVENOUS | Status: AC
  Administered 2020-04-28: 400 mg via INTRAVENOUS
  Filled 2020-04-28: qty 200

## 2020-04-28 MED ORDER — BUPIVACAINE HCL (PF) 0.5 % IJ SOLN
INTRAMUSCULAR | Status: AC
  Filled 2020-04-28: qty 30

## 2020-04-28 MED ORDER — BUPIVACAINE HCL (PF) 0.5 % IJ SOLN
INTRAMUSCULAR | Status: DC | PRN
  Administered 2020-04-28: 10 mL

## 2020-04-28 MED ORDER — ROCURONIUM BROMIDE 100 MG/10ML IV SOLN
INTRAVENOUS | Status: DC | PRN
  Administered 2020-04-28: 30 mg via INTRAVENOUS
  Administered 2020-04-28: 10 mg via INTRAVENOUS
  Administered 2020-04-28 (×2): 20 mg via INTRAVENOUS

## 2020-04-28 MED ORDER — ROCURONIUM BROMIDE 10 MG/ML (PF) SYRINGE
PREFILLED_SYRINGE | INTRAVENOUS | Status: AC
  Filled 2020-04-28: qty 10

## 2020-04-28 MED ORDER — DIPHENHYDRAMINE HCL 50 MG/ML IJ SOLN
INTRAMUSCULAR | Status: AC
  Filled 2020-04-28: qty 1

## 2020-04-28 MED ORDER — DIPHENHYDRAMINE HCL 50 MG/ML IJ SOLN
INTRAMUSCULAR | Status: DC | PRN
  Administered 2020-04-28: 12.5 mg via INTRAVENOUS

## 2020-04-28 MED ORDER — PHENOL 1.4 % MT LIQD
1.0000 | OROMUCOSAL | Status: DC | PRN
  Administered 2020-04-28: 1 via OROMUCOSAL
  Filled 2020-04-28: qty 177

## 2020-04-28 SURGICAL SUPPLY — 33 items
ADH SKN CLS APL DERMABOND .7 (GAUZE/BANDAGES/DRESSINGS) ×1
APL PRP STRL LF DISP 70% ISPRP (MISCELLANEOUS) ×1
ATTRACTOMAT 16X20 MAGNETIC DRP (DRAPES) ×2 IMPLANT
BLADE SURG 15 STRL LF DISP TIS (BLADE) ×1 IMPLANT
BLADE SURG 15 STRL SS (BLADE) ×2
CHLORAPREP W/TINT 26 (MISCELLANEOUS) ×2 IMPLANT
CLIP VESOCCLUDE MED 6/CT (CLIP) ×4 IMPLANT
CLIP VESOCCLUDE SM WIDE 6/CT (CLIP) ×8 IMPLANT
COVER SURGICAL LIGHT HANDLE (MISCELLANEOUS) ×2 IMPLANT
COVER WAND RF STERILE (DRAPES) ×2 IMPLANT
DERMABOND ADVANCED (GAUZE/BANDAGES/DRESSINGS) ×1
DERMABOND ADVANCED .7 DNX12 (GAUZE/BANDAGES/DRESSINGS) ×1 IMPLANT
DRAPE LAPAROTOMY T 98X78 PEDS (DRAPES) ×2 IMPLANT
DRAPE UTILITY XL STRL (DRAPES) ×2 IMPLANT
ELECT REM PT RETURN 15FT ADLT (MISCELLANEOUS) ×2 IMPLANT
GAUZE 4X4 16PLY RFD (DISPOSABLE) ×2 IMPLANT
GLOVE SURG ORTHO 8.0 STRL STRW (GLOVE) ×2 IMPLANT
GOWN STRL REUS W/TWL XL LVL3 (GOWN DISPOSABLE) ×6 IMPLANT
HEMOSTAT SURGICEL 2X4 FIBR (HEMOSTASIS) ×2 IMPLANT
ILLUMINATOR WAVEGUIDE N/F (MISCELLANEOUS) IMPLANT
KIT BASIN OR (CUSTOM PROCEDURE TRAY) ×2 IMPLANT
KIT TURNOVER KIT A (KITS) IMPLANT
NEEDLE HYPO 25X1 1.5 SAFETY (NEEDLE) ×2 IMPLANT
PACK BASIC VI WITH GOWN DISP (CUSTOM PROCEDURE TRAY) ×2 IMPLANT
PENCIL SMOKE EVACUATOR (MISCELLANEOUS) ×2 IMPLANT
SHEARS HARMONIC 9CM CVD (BLADE) ×2 IMPLANT
SUT MNCRL AB 4-0 PS2 18 (SUTURE) ×2 IMPLANT
SUT VIC AB 3-0 SH 18 (SUTURE) ×2 IMPLANT
SYR BULB IRRIG 60ML STRL (SYRINGE) ×2 IMPLANT
SYR CONTROL 10ML LL (SYRINGE) ×2 IMPLANT
TOWEL OR 17X26 10 PK STRL BLUE (TOWEL DISPOSABLE) ×2 IMPLANT
TOWEL OR NON WOVEN STRL DISP B (DISPOSABLE) ×2 IMPLANT
TUBING CONNECTING 10 (TUBING) ×2 IMPLANT

## 2020-04-28 NOTE — Interval H&P Note (Signed)
History and Physical Interval Note:  04/28/2020 7:01 AM  Cynthia [name]  has presented today for surgery, with the diagnosis of PRIMARY HYPERPARATHYROIDISM.  The various methods of treatment have been discussed with the patient and family. After consideration of risks, benefits and other options for treatment, the patient has consented to    Procedure(s): RIGHT INFERIOR PARATHYROIDECTOMY (Right) as a surgical intervention.    The patient's history has been reviewed, patient examined, no change in status, stable for surgery.  I have reviewed the patient's chart and labs.  Questions were answered to the patient's satisfaction.    Armandina Gemma, MD [facility] Office: [place]

## 2020-04-28 NOTE — Discharge Instructions (Signed)
Parathyroidectomy, Care After This sheet gives you information about how to care for yourself after your procedure. Your health care provider may also give you more specific instructions. If you have problems or questions, contact your health care provider. What can I expect after the procedure? After the procedure, it is common to have:  Mild pain in the neck or upper body, especially when swallowing.  A swollen neck.  A sore throat.  A weak or hoarse voice.  Slight tingling or numbness around your mouth, or in your fingers or toes. This may last for a day or two after surgery. This condition is caused by low levels of calcium. You may be given calcium supplements to treat it. Follow these instructions at home:  Medicines  Take over-the-counter and prescription medicines only as told by your health care provider.  Do not drive or use heavy machinery while taking prescription pain medicine.  Do not take medicines that contain aspirin and ibuprofen until your health care provider says that you can. These medicines can increase your risk of bleeding. Eating and drinking  Follow instructions from your health care provider about eating or drinking restrictions. You may need to have only liquids and soft foods for a day after the procedure.  To prevent or treat constipation while you are taking prescription pain medicine, your health care provider may recommend that you: ? Drink enough fluid to keep your urine pale yellow. ? Take over-the-counter or prescription medicines. ? Eat foods that are high in fiber, such as fresh fruits and vegetables, whole grains, and beans. ? Limit foods that are high in fat and processed sugars, such as fried and sweet foods. Incision care  Follow instructions from your health care provider about how to take care of your incision. Make sure you: ? Wash your hands with soap and water before you change your bandage (dressing). If soap and water  are not available, use hand sanitizer. ? Change your dressing as told by your health care provider. ? Leave stitches (sutures), skin glue, or adhesive strips in place. These skin closures may need to stay in place for 2 weeks or longer. If adhesive strip edges start to loosen and curl up, you may trim the loose edges. Do not remove adhesive strips completely unless your health care provider tells you to do that.  Check your incision area every day for signs of infection. Check for: ? Redness, swelling, or pain. ? Fluid or blood. ? Warmth. ? Pus or a bad smell.  Do not take baths, swim, or use a hot tub until your health care provider approves. Activity  For the first 10 days after the procedure or as instructed by your health care provider: ? Do not lift anything that is heavier than 10 lb (4.5 kg). ? Do not jog, swim, or do other strenuous exercises. ? Do not play contact sports.  Avoid sitting for a long time without moving. Get up to take short walks every 1-2 hours. This is important to improve blood flow and breathing. Ask for help if you feel weak or unsteady.  Return to your normal activities as told by your health care provider. Ask your health care provider what activities are safe for you. General instructions  Do not use any products that contain nicotine or tobacco, such as cigarettes and e-cigarettes. These can delay healing after surgery. If you need help quitting, ask your health care provider.  Keep all follow-up visits as  told by your health care provider. This is important. Your health care provider needs to monitor the calcium level in your blood to make sure that it does not become low. Contact a health care provider if you:  Have a fever.  Have more redness, swelling, or pain around your incision area.  Have fluid or blood coming from your incision area.  Notice that your incision area feels warm to the touch.  Have pus or a bad smell coming from your incision  area.  Have trouble talking.  Have nausea or vomiting for more than 2 days. Get help right away if you:  Have trouble breathing.  Have trouble swallowing.  Develop a rash.  Develop a cough that gets worse.  Notice that your speech changes, or you have hoarseness that gets worse.  Develop numbness, tingling, or muscle spasms in the arms, hands, feet, or face. Summary  For a day or two after the procedure, you may have tingling or numbness around your mouth, or in your fingers or toes. Temporary hoarseness may also occur.  Follow instructions from your health care provider about how to take care of your incision. Watch for signs of infection.  Keep all follow-up visits as told by your health care provider. This is important. Your health care provider needs to monitor the calcium level in your blood to make sure that it does not become low.  Get help right away if you develop difficulty breathing, or numbness, tingling, or muscle spasms in the arms, hands, feet, or face. This information is not intended to replace advice given to you by your health care provider. Make sure you discuss any questions you have with your health care provider. Document Revised: 04/19/2017 Document Reviewed: 03/12/2017 Elsevier Patient Education  2020 [place] Anesthesia, Adult, Care After This sheet gives you information about how to care for yourself after your procedure. Your health care provider may also give you more specific instructions. If you have problems or questions, contact your health care provider. What can I expect after the procedure? After the procedure, the following side effects are common:  Pain or discomfort at the IV site.  Nausea.  Vomiting.  Sore throat.  Trouble concentrating.  Feeling cold or chills.  Weak or tired.  Sleepiness and fatigue.  Soreness and body aches. These side effects can affect parts of the body that were not involved in  surgery. Follow these instructions at home:  For at least 24 hours after the procedure:  Have a responsible adult stay with you. It is important to have someone help care for you until you are awake and alert.  Rest as needed.  Do not: ? Participate in activities in which you could fall or become injured. ? Drive. ? Use heavy machinery. ? Drink alcohol. ? Take sleeping pills or medicines that cause drowsiness. ? Make important decisions or sign legal documents. ? Take care of children on your own. Eating and drinking  Follow any instructions from your health care provider about eating or drinking restrictions.  When you feel hungry, start by eating small amounts of foods that are soft and easy to digest (bland), such as toast. Gradually return to your regular diet.  Drink enough fluid to keep your urine pale yellow.  If you vomit, rehydrate by drinking water, juice, or clear broth. General instructions  If you have sleep apnea, surgery and certain medicines can increase your risk for breathing problems. Follow instructions  from your health care provider about wearing your sleep device: ? Anytime you are sleeping, including during daytime naps. ? While taking prescription pain medicines, sleeping medicines, or medicines that make you drowsy.  Return to your normal activities as told by your health care provider. Ask your health care provider what activities are safe for you.  Take over-the-counter and prescription medicines only as told by your health care provider.  If you smoke, do not smoke without supervision.  Keep all follow-up visits as told by your health care provider. This is important. Contact a health care provider if:  You have nausea or vomiting that does not get better with medicine.  You cannot eat or drink without vomiting.  You have pain that does not get better with medicine.  You are unable to pass urine.  You develop a skin rash.  You have a  fever.  You have redness around your IV site that gets worse. Get help right away if:  You have difficulty breathing.  You have chest pain.  You have blood in your urine or stool, or you vomit blood. Summary  After the procedure, it is common to have a sore throat or nausea. It is also common to feel tired.  Have a responsible adult stay with you for the first 24 hours after general anesthesia. It is important to have someone help care for you until you are awake and alert.  When you feel hungry, start by eating small amounts of foods that are soft and easy to digest (bland), such as toast. Gradually return to your regular diet.  Drink enough fluid to keep your urine pale yellow.  Return to your normal activities as told by your health care provider. Ask your health care provider what activities are safe for you. This information is not intended to replace advice given to you by your health care provider. Make sure you discuss any questions you have with your health care provider. Document Revised: 05/10/2017 Document Reviewed: 12/21/2016 Elsevier Patient Education  [place].

## 2020-04-28 NOTE — Op Note (Signed)
Operative Note  Pre-operative Diagnosis:  Primary hyperparathyroidism   Post-operative Diagnosis:  same  Surgeon:  Armandina Gemma, MD  Assistant:  none   Procedure:  Right parathyroidectomy, right thyroid lobectomy and isthmusectomy  Anesthesia:  general  Estimated Blood Loss:  25 cc  Drains: none         Specimen: to pathology for frozen and permanent  Indications:  Patient is referred by Dr. Philemon Kingdom for surgical evaluation and management of primary hyperparathyroidism. Patient is followed in our practice after bariatric surgery by Dr. Johnathan Hausen. Patient was noted to have elevated serum calcium levels. She was seen and evaluated by Dr. Cruzita Lederer. On physical examination she was noted to have a palpable thyroid nodule. She underwent ultrasound on September 09, 2019. This demonstrated a solitary mid right thyroid nodule measuring 4.6 cm in size. It was felt to be moderately suspicious and biopsy was recommended. Fine-needle aspiration biopsy on Sep 24, 2019 showed a benign follicular nodule without evidence of malignancy. TSH level is normal at 2.25. Evaluation for hypercalcemia showed several serum calcium levels greater than 11 with her most recent level in April 2021 being 11.5. Intact PTH level has ranged from 116-207. Vitamin D was corrected to normal level of 46. 24-hour urine collection for calcium was obtained and was elevated at 400.  Nuclear medicine parathyroid scan localized a right inferior parathyroid adenoma.  Patient now comes to surgery for neck exploration and parathyroidectomy.  Procedure:  The patient was seen in the pre-op holding area. The risks, benefits, complications, treatment options, and expected outcomes were previously discussed with the patient. The patient agreed with the proposed plan and has signed the informed consent form.  The patient was brought to the operating room by the surgical team, identified as [place] and the procedure  verified. A "time out" was completed and the above information confirmed.  Following administration of general anesthesia, the patient was positioned and then prepped and draped in the usual aseptic fashion.  After ascertaining that an adequate level of anesthesia been achieved, an inferior right neck incision is made with a #15 blade.  Dissection was carried through subcutaneous tissues and platysma.  Skin flaps are developed circumferentially and a Weitlander retractor placed for exposure.  Strap muscles are incised in the midline and reflected to the right.  The inferior pole of the thyroid is mobilized.  Exploration along the posterior surface of the thyroid and tracheoesophageal groove fails to reveal any evidence of adenoma.  Dissection is carried posteriorly along the esophagus and back to the precervical fascia.  There is a mass posterior to the thyroid lobe.  This had been identified on thyroid ultrasound is a 4.6 cm thyroid nodule.  Fine-needle aspiration biopsy had been performed and was benign.  A decision was made to expose this area more thoroughly with a larger cervical incision.  Therefore the incision is extended across the midline into a small Kocher incision.  Subplatysmal flaps are then developed cephalad and caudad and a Mahorner self-retaining retractors placed for exposure.  Strap muscles are incised in the midline from the thyroid notch to the sternal notch.  The entire right thyroid lobe is then exposed.  There is a large mass which is essentially is attached to the posterior aspect of what would otherwise be a normal-appearing thyroid lobe.  Decision is made to proceed with thyroid lobectomy.  Superior pole vessels are dissected out and divided between small and medium ligaclips with the harmonic scalpel.  Gland  is rolled anteriorly with the 4.6 cm mass attached to the posterior aspect of the thyroid.  This is gently mobilized.  Vascular structures are divided between small ligaclips  with the harmonic scalpel.  Recurrent laryngeal nerve is identified and preserved along its length.  The mass and right lobe are rolled anteriorly onto the trachea.  The branches of the inferior thyroid artery are divided between small ligaclips with the harmonic scalpel.  Ligament of Gwenlyn Found is released with the electrocautery and the gland is mobilized onto the trachea.  Isthmus is mobilized across the midline.  Thyroid parenchyma is divided with the harmonic scalpel at the junction of the isthmus and left thyroid lobe.  Inspection shows what appears to be a normal right thyroid lobe with a large firm heterogeneous mass attached to the posterior aspect of the lobe.  The inferior pole has what appears to be parathyroid tissue.  This is marked with a suture.  Specimen is submitted to pathology where frozen section confirms hypercellular parathyroid tissue.  Dr. Vicente Males did additional levels up into the large 4.6 cm mass.  All of this appears to be hypercellular parathyroid tissue.  Neck is irrigated with warm saline.  Good hemostasis is achieved throughout the operative field.  Fibrillar is placed throughout the operative field.  Strap muscles are reapproximated in the midline with interrupted 3-0 Vicryl sutures.  Platysma was closed with interrupted 3-0 Vicryl sutures.  Skin is closed with a running 4-0 Monocryl subcuticular suture.  Skin is anesthetized with local Marcaine anesthetic.  Wound is washed and dried and Dermabond is applied as dressing.  Patient is awakened from anesthesia and transported to the recovery room.  The patient tolerated the procedure well.   Armandina Gemma, MD [facility] Office: [phone]

## 2020-04-28 NOTE — Transfer of Care (Signed)
Immediate Anesthesia Transfer of Care Note  Patient: Cynthia [name]  Procedure(s) Performed: RIGHT  PARATHYROIDECTOMY (Right Neck) RIGHT THYROID LOBECTOMY (Right Neck)  Patient Location: PACU  Anesthesia Type:General  Level of Consciousness: sedated  Airway & Oxygen Therapy: Patient Spontanous Breathing and Patient connected to face mask oxygen  Post-op Assessment: Report given to RN and Post -op Vital signs reviewed and stable  Post vital signs: Reviewed and stable  Last Vitals:  Vitals Value Taken Time  BP 124/73 04/28/20 1016  Temp 36.7 C 04/28/20 1016  Pulse 69 04/28/20 1019  Resp 22 04/28/20 1019  SpO2 96 % 04/28/20 1019  Vitals shown include unvalidated device data.  Last Pain:  Vitals:   04/28/20 0545  TempSrc: Oral  PainSc: 0-No pain         Complications: No complications documented.

## 2020-04-28 NOTE — Anesthesia Procedure Notes (Signed)
Procedure Name: Intubation Date/Time: 04/28/2020 7:37 AM Performed by: Elaina Pattee, CRNA Pre-anesthesia Checklist: Patient identified Patient Re-evaluated:Patient Re-evaluated prior to induction Oxygen Delivery Method: Circle system utilized Preoxygenation: Pre-oxygenation with 100% oxygen Induction Type: IV induction Ventilation: Mask ventilation without difficulty Laryngoscope Size: Glidescope and 3 Grade View: Grade I Tube type: Oral Tube size: 7.0 mm Number of attempts: 1 Airway Equipment and Method: Stylet and Oral airway Placement Confirmation: ETT inserted through vocal cords under direct vision,  positive ETCO2 and breath sounds checked- equal and bilateral Tube secured with: Tape Dental Injury: Teeth and Oropharynx as per pre-operative assessment and Injury to lip

## 2020-04-28 NOTE — Anesthesia Postprocedure Evaluation (Signed)
Anesthesia Post Note  Patient: Cynthia [name]  Procedure(s) Performed: RIGHT  PARATHYROIDECTOMY (Right Neck) RIGHT THYROID LOBECTOMY (Right Neck)     Patient location during evaluation: PACU Anesthesia Type: General Level of consciousness: awake and alert, patient cooperative and oriented Pain management: pain level controlled Vital Signs Assessment: post-procedure vital signs reviewed and stable Respiratory status: spontaneous breathing, nonlabored ventilation and respiratory function stable Cardiovascular status: blood pressure returned to baseline and stable Postop Assessment: no apparent nausea or vomiting Anesthetic complications: no   No complications documented.  Last Vitals:  Vitals:   04/28/20 1347 04/28/20 1437  BP: 130/78 124/68  Pulse: (!) 56 (!) 50  Resp: 18   Temp: 36.6 C 36.6 C  SpO2: 100% 100%    Last Pain:  Vitals:   04/28/20 1437  TempSrc: Oral  PainSc:                  Taelyr Jantz,E. Lujean Ebright

## 2020-04-29 ENCOUNTER — Other Ambulatory Visit (HOSPITAL_COMMUNITY): Payer: Self-pay | Admitting: Surgery

## 2020-04-29 ENCOUNTER — Encounter (HOSPITAL_COMMUNITY): Payer: Self-pay | Admitting: Surgery

## 2020-04-29 DIAGNOSIS — E213 Hyperparathyroidism, unspecified: Secondary | ICD-10-CM | POA: Diagnosis not present

## 2020-04-29 LAB — BASIC METABOLIC PANEL
Anion gap: 10 (ref 5–15)
BUN: 15 mg/dL (ref 6–20)
CO2: 24 mmol/L (ref 22–32)
Calcium: 9.4 mg/dL (ref 8.9–10.3)
Chloride: 107 mmol/L (ref 98–111)
Creatinine, Ser: 0.88 mg/dL (ref 0.44–1.00)
GFR, Estimated: >60 mL/min (ref >60)
Glucose, Bld: 101 mg/dL — ABNORMAL HIGH (ref 70–99)
Potassium: 3.6 mmol/L (ref 3.5–5.1)
Sodium: 141 mmol/L (ref 135–145)

## 2020-04-29 MED ORDER — TRAMADOL HCL 50 MG PO TABS
50.0000 mg | ORAL_TABLET | Freq: Four times a day (QID) | ORAL | 0 refills | Status: DC | PRN
Start: 2020-04-29 — End: 2020-04-29

## 2020-04-29 MED FILL — traMADol HCL 50 MG TABS: 50 | 2 days supply | Qty: 15 | Fill #0

## 2020-04-29 NOTE — Discharge Summary (Signed)
Physician Discharge Summary [facility]  Patient ID: Cynthia [name] MRN: [id number] DOB/AGE: 1966-05-05 54 y.o.  Admit date: 04/28/2020  Discharge date: 04/29/2020  Discharge Diagnoses:  Principal Problem:   Hyperparathyroidism, primary [facility]) Active Problems:   Hypercalcemia   Primary hyperparathyroidism [facility])   Discharged Condition: good  Hospital Course: Patient was admitted for observation following thyroid and parathyroid surgery.  Post op course was uncomplicated.  Pain was well controlled.  Tolerated diet.  Post op calcium level on morning following surgery was 9.4 mg/dl.  Patient was prepared for discharge home on POD#1.  Consults: None  Treatments: surgery: right thyroid lobectomy and parathyroidectomy  Discharge Exam: Blood pressure 118/73, pulse 60, temperature 98 F (36.7 C), temperature source Oral, resp. rate 18, weight 132.9 kg, last menstrual period 04/24/2014, SpO2 98 %. HEENT - clear Neck - wound dry and intact with Dermabond; mild STS; voice norma Chest - clear bilaterally Cor - RRR  Disposition: Home  Discharge Instructions    Diet - low sodium heart healthy   Complete by: As directed    Discharge instructions   Complete by: As directed    [facility]  THYROID & PARATHYROID SURGERY:  POST-OP INSTRUCTIONS  Always review your discharge instruction sheet from the facility where your surgery was performed.  A prescription for pain medication may be given to you upon discharge.  Take your pain medication as prescribed.  If narcotic pain medicine is not needed, then you may take acetaminophen (Tylenol) or ibuprofen (Advil) as needed.  Take your usually prescribed medications unless otherwise directed.  If you need a refill on your pain medication, please contact our office during regular business hours.  Prescriptions cannot be processed by our office after 5 pm or on weekends.  Start with a light diet  upon arrival home, such as soup and crackers or toast.  Be sure to drink plenty of fluids daily.  Resume your normal diet the day after surgery.  Most patients will experience some swelling and bruising on the chest and neck area.  Ice packs will help.  Swelling and bruising can take several days to resolve.   It is common to experience some constipation after surgery.  Increasing fluid intake and taking a stool softener (Colace) will usually help or prevent this problem.  A mild laxative (Milk of Magnesia or Miralax) should be taken according to package directions if there has been no bowel movement after 48 hours.  You have steri-strips and a gauze dressing over your incision.  You may remove the gauze bandage on the second day after surgery, and you may shower at that time.  Leave your steri-strips (small skin tapes) in place directly over the incision.  These strips should remain on the skin for 5-7 days and then be removed.  You may get them wet in the shower and pat them dry.  You may resume regular (light) daily activities beginning the next day (such as daily self-care, walking, climbing stairs) gradually increasing activities as tolerated.  You may have sexual intercourse when it is comfortable.  Refrain from any heavy lifting or straining until approved by your doctor.  You may drive when you no longer are taking prescription pain medication, you can comfortably wear a seatbelt, and you can safely maneuver your car and apply brakes.  You should see your doctor in the office for a follow-up appointment approximately three weeks after your surgery.  Make sure that you call  for this appointment within a day or two after you arrive home to insure a convenient appointment time.  WHEN TO CALL YOUR DOCTOR: -- Fever greater than 101.5 -- Inability to urinate -- Nausea and/or vomiting - persistent -- Extreme swelling or bruising -- Continued bleeding from incision -- Increased pain, redness, or  drainage from the incision -- Difficulty swallowing or breathing -- Muscle cramping or spasms -- Numbness or tingling in hands or around lips  The clinic staff is available to answer your questions during regular business hours.  Please don't hesitate to call and ask to speak to one of the nurses if you have concerns.  Armandina Gemma, MD [facility] Office: [phone]   Increase activity slowly   Complete by: As directed    No dressing needed   Complete by: As directed      Allergies as of 04/29/2020      Reactions   Latex Rash   Where touched and will spread.   Penicillin G Itching      Medication List    TAKE these medications   albuterol 108 (90 Base) MCG/ACT inhaler Commonly known as: Ventolin HFA Inhale 2 puffs into the lungs every 4 (four) hours as needed for wheezing or shortness of breath (and cough x 3 days, then prn). What changed: reasons to take this   AMBULATORY NON FORMULARY MEDICATION Knee-high, medium compression, graduated compression stockings. Apply to bilateral lower extremities daily   aspirin EC 81 MG tablet Take 81 mg by mouth daily.   celecoxib 100 MG capsule Commonly known as: CELEBREX 1 tab PO bid or 2 tabs PO QD What changed:   how much to take  how to take this  when to take this  additional instructions   cephALEXin 500 MG capsule Commonly known as: KEFLEX Take 1 capsule (500 mg total) by mouth 3 (three) times daily.   FLUoxetine 20 MG capsule Commonly known as: PROZAC Take 1 capsule (20 mg total) by mouth daily.   gabapentin 300 MG capsule Commonly known as: NEURONTIN Take 2 capsules (600 mg total) by mouth 3 (three) times daily.   NONFORMULARY OR COMPOUNDED ITEM Knee high medium compression stockings. Wear daily. Remove at bedtime   T.E.D. Below Knee/X-Large Misc Apply to lower extremities daily. Remove at bedtime   terbinafine 250 MG tablet Commonly known as: LamISIL Take 1 tablet (250 mg total)  by mouth daily.   traMADol 50 MG tablet Commonly known as: ULTRAM Take 1-2 tablets (50-100 mg total) by mouth every 6 (six) hours as needed for moderate pain.   vitamin B-12 1000 MCG tablet Commonly known as: CYANOCOBALAMIN Take 1,000 mcg by mouth daily.   Vitamin D3 50 MCG (2000 UT) Tabs Take 6,000 Units by mouth See admin instructions. Take 6000 units by mouth twice daily on Monday, Wednesday and Friday, take 6000 units daily on Tuesday, Thursday, Saturday and Sunday   Vitamin D 125 MCG (5000 UT) Caps Take 5,000 Units by mouth every other day.   zolpidem 5 MG tablet Commonly known as: AMBIEN Take 1 tablet (5 mg total) by mouth at bedtime as needed. For sleep            Discharge Care Instructions  (From admission, onward)         Start     Ordered   04/29/20 0000  No dressing needed        12 /10/21 0850           Sherren Mocha  Harlow Asa, [facility] Office: [phone]   Signed: Armandina Gemma 04/29/2020, 8:56 AM

## 2020-04-29 NOTE — Progress Notes (Signed)
D/C instructions given to patient. Patient had no questions. NT or writer will wheel patient out once she is dressed

## 2020-04-30 ENCOUNTER — Encounter (HOSPITAL_COMMUNITY): Payer: Self-pay | Admitting: *Deleted

## 2020-04-30 ENCOUNTER — Other Ambulatory Visit: Payer: Self-pay

## 2020-04-30 ENCOUNTER — Emergency Department (HOSPITAL_COMMUNITY)
Admission: EM | Admit: 2020-04-30 | Discharge: 2020-04-30 | Disposition: A | Discharge status: Home / Self Care | Payer: No Typology Code available for payment source | Attending: Emergency Medicine | Admitting: Emergency Medicine

## 2020-04-30 DIAGNOSIS — Z5321 Procedure and treatment not carried out due to patient leaving prior to being seen by health care provider: Secondary | ICD-10-CM | POA: Diagnosis not present

## 2020-04-30 DIAGNOSIS — R202 Paresthesia of skin: Secondary | ICD-10-CM | POA: Insufficient documentation

## 2020-04-30 NOTE — ED Notes (Signed)
I attenmpted twice to collect labs and was unsuccessful

## 2020-04-30 NOTE — ED Notes (Signed)
Pt. Left at 1712. Pt. Refuse to get updated vitals. Nurse aware.

## 2020-04-30 NOTE — ED Notes (Signed)
Unsuccessful lab draw x1.

## 2020-04-30 NOTE — ED Triage Notes (Signed)
Pt had Thyroid surgery Thursday, discharged yesterday, last night started to have tingling in face and body, called on call today, was told to come to ED for labs to be checked.

## 2020-05-02 LAB — SURGICAL PATHOLOGY

## 2020-05-31 MED FILL — FLUoxetine HCL 20 MG CAPS: 20 | 30 days supply | Qty: 30 | Fill #2

## 2020-06-16 IMAGING — DX DG KNEE COMPLETE 4+V*L*
4 series · 4 of 4 positions shown · non-contrast
Comparison: None.

CLINICAL DATA: 51-year-old female with chronic left knee pain. No
injury.

EXAM:
LEFT KNEE - COMPLETE 4+ VIEW

[knee lat]
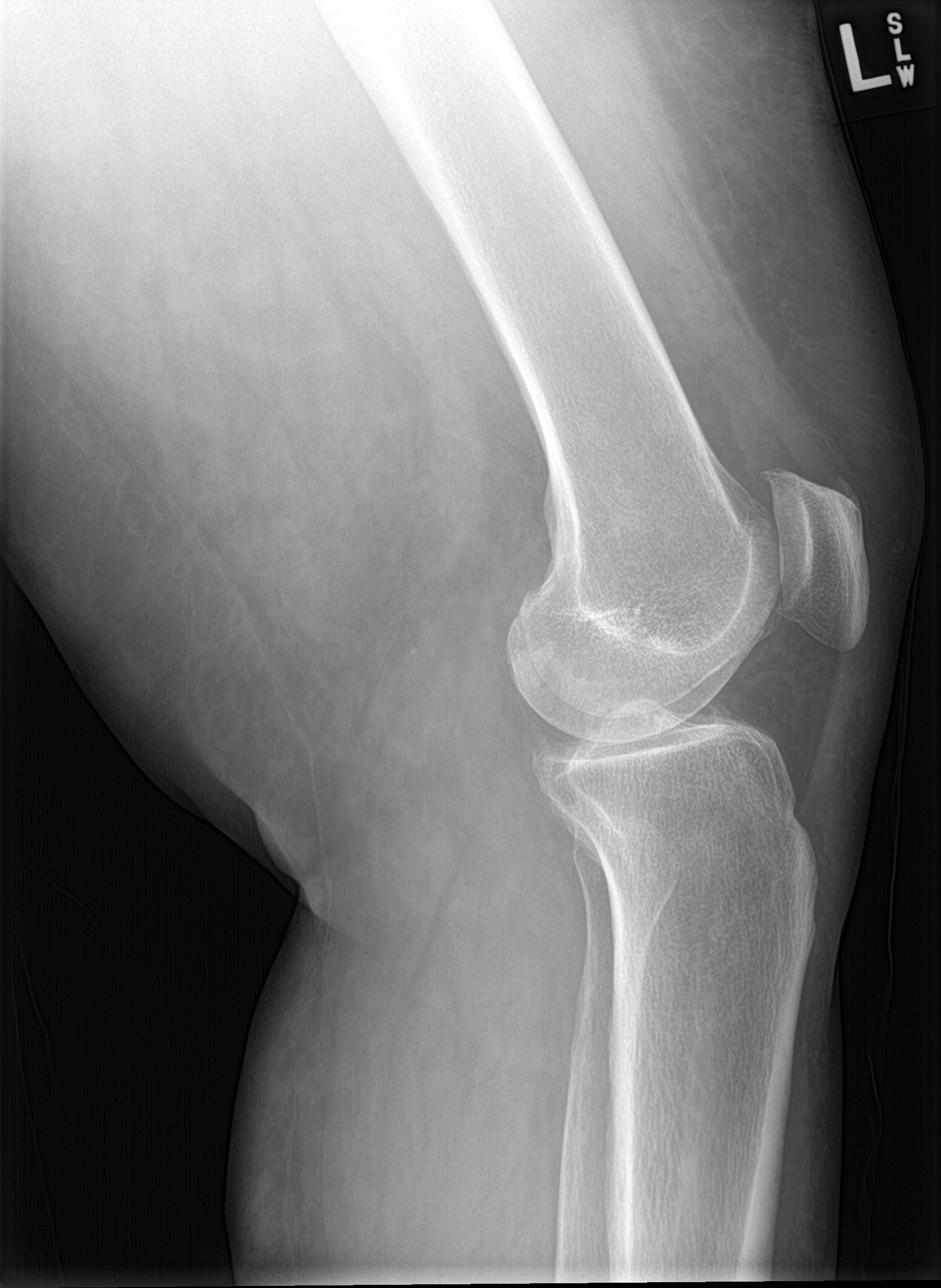

[knee sunrise]
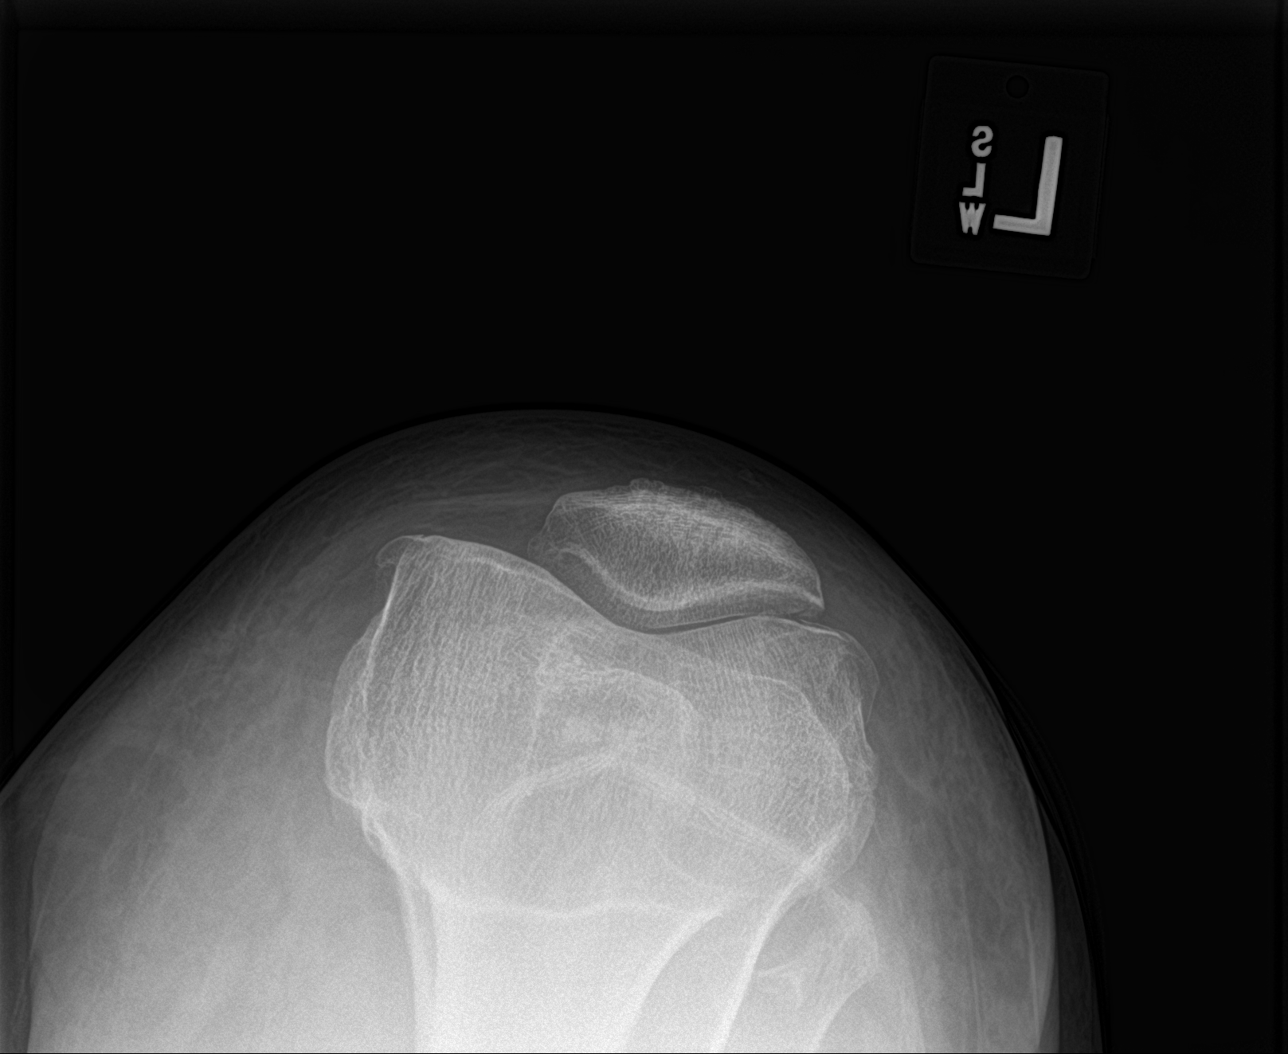

[knee ap bilat standing (1 of 2)]
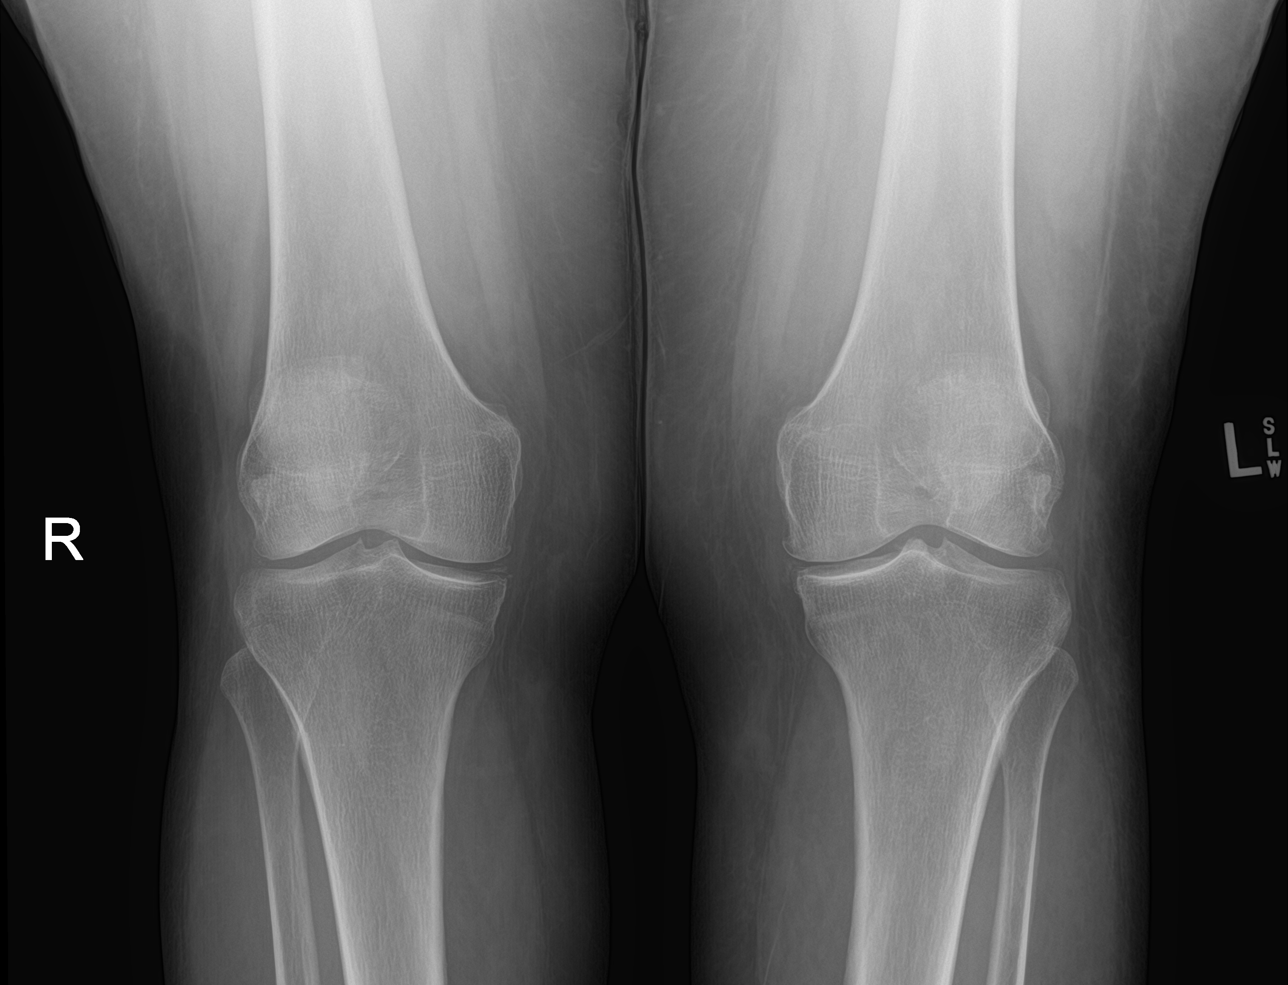

[knee ap bilat standing (2 of 2)]
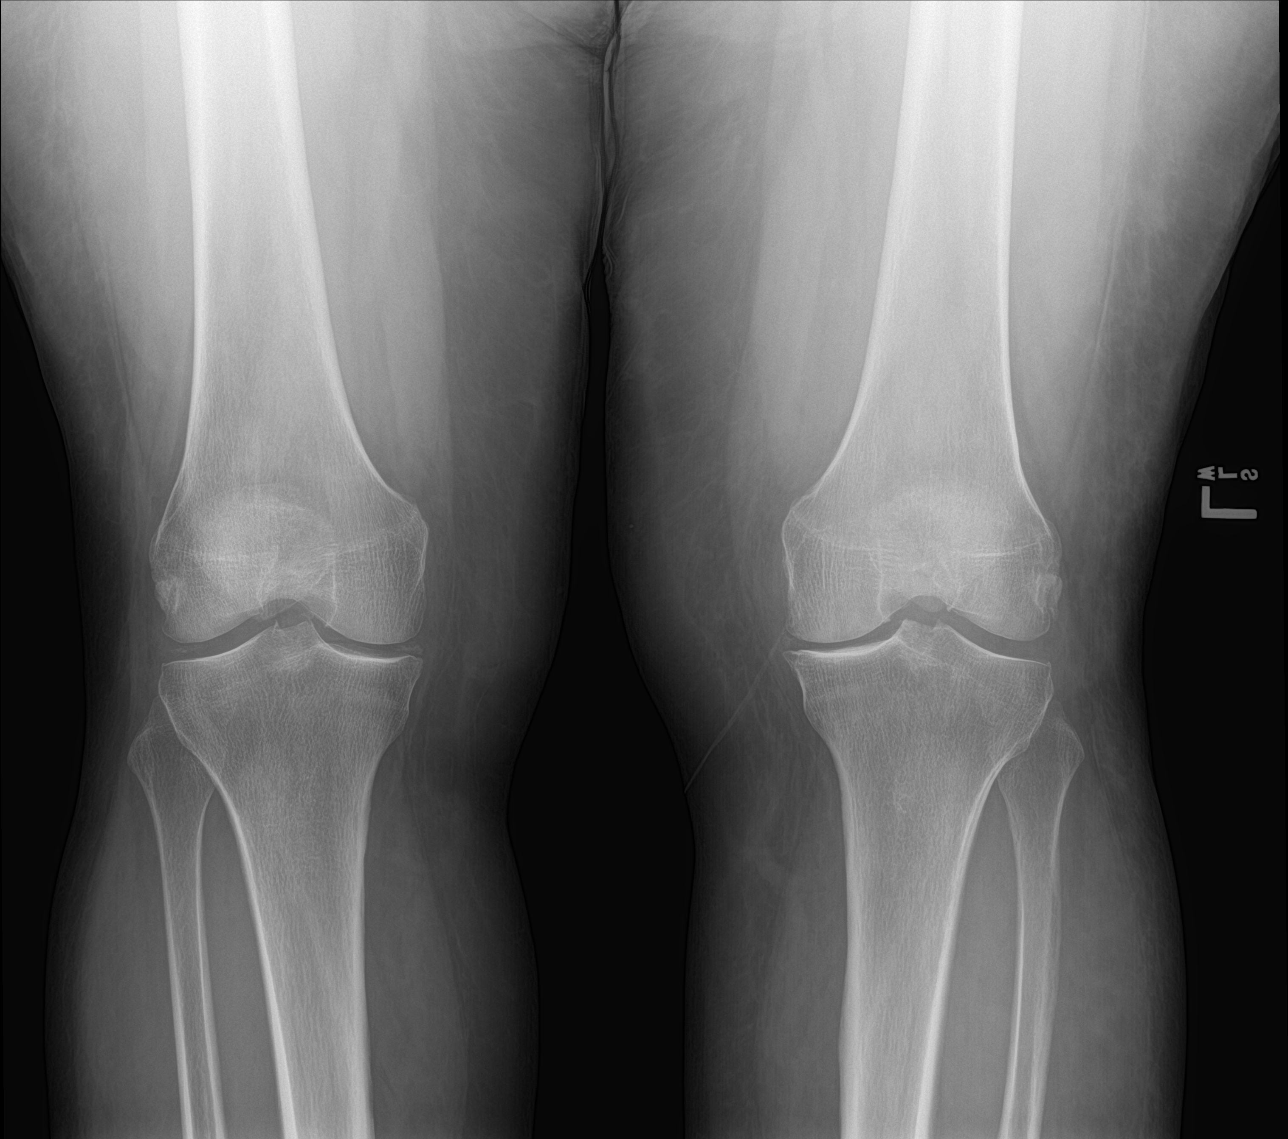

[4 of 4 positions shown; findings below may reference images not displayed]

FINDINGS: Mild medial tibiofemoral joint space and patellofemoral joint
degenerative changes.

Slightly dense medial meniscus may represent changes of
chondrocalcinosis.

No fracture or dislocation.

No significant joint effusion.
IMPRESSION: Mild left medial tibiofemoral joint space and patellofemoral joint
degenerative changes.

Slightly dense medial meniscus may represent changes of
chondrocalcinosis.

## 2020-06-21 ENCOUNTER — Encounter: Payer: Self-pay | Admitting: Internal Medicine

## 2020-06-22 ENCOUNTER — Other Ambulatory Visit: Payer: Self-pay

## 2020-06-24 ENCOUNTER — Other Ambulatory Visit: Payer: Self-pay

## 2020-06-24 ENCOUNTER — Encounter: Payer: Self-pay | Admitting: Internal Medicine

## 2020-06-24 ENCOUNTER — Ambulatory Visit: Payer: No Typology Code available for payment source | Admitting: Internal Medicine

## 2020-06-24 VITALS — BP 120/82 | HR 65 | Ht 62.0 in | Wt 286.2 lb

## 2020-06-24 DIAGNOSIS — E890 Postprocedural hypothyroidism: Secondary | ICD-10-CM

## 2020-06-24 DIAGNOSIS — E041 Nontoxic single thyroid nodule: Secondary | ICD-10-CM

## 2020-06-24 DIAGNOSIS — E559 Vitamin D deficiency, unspecified: Secondary | ICD-10-CM | POA: Diagnosis not present

## 2020-06-24 DIAGNOSIS — E213 Hyperparathyroidism, unspecified: Secondary | ICD-10-CM

## 2020-06-24 NOTE — Progress Notes (Signed)
Patient ID: [name], female   DOB: 01/30/1966, 55 y.o.   MRN: [id number]   This visit occurred during the SARS-CoV-2 public health emergency.  Safety protocols were in place, including screening questions prior to the visit, additional usage of staff PPE, and extensive cleaning of exam room while observing appropriate contact time as indicated for disinfecting solutions.   HPI  Cynthia [name] is a 55 y.o.-year-old female returning for follow-up for hypercalcemia/hyperparathyroidism and vitamin D deficiency.  Last visit 5 months ago.  Since last visit, she had right parathyroidectomy on right thyroid lobectomy by Dr. Harlow Asa. She had significant constipation, mental fog, and feeling poorly overall, with generalized aches and pains before the surgery, but these resolved afterwards!  Right after surgery, she had tingling in hands and center face, resolved with Tums, and she still has the tingling occasionally, but much milder.  Also, after surgery she had mm cramps in lower legs >> resolved with Tums. She now has only occasional muscle cramps. She takes Tums approximately 3 times a week.  Reviewed and addended history: She was diagnosed with hypercalcemia in 2012 and she was found to have a very high PTH in 11/2017.  This was confirmed in 10/2018.  Since then, we have been trying to improve her vitamin D level to further investigate and treat her hyperparathyroidism.    Please pointed towards primary hyperparathyroidism so I referred her to surgery at last visit. She saw Dr. Harlow Asa >> further repeat imaging investigation for her hyperparathyroidism is planned.  Reviewed pertinent results: Postop: Calcium 8.7, PTH 33 Lab Results  Component Value Date   PTH 116 (H) 09/03/2019   PTH Comment 09/03/2019   PTH 207 (H) 10/28/2018   PTH 316 (H) 11/27/2017   CALCIUM 9.4 04/29/2020   CALCIUM 10.8 (H) 04/18/2020   CALCIUM 11.6 (H) 02/24/2020   CALCIUM 11.6 (H) 02/09/2020   CALCIUM  11.5 (H) 09/03/2019   CALCIUM 11.8 (H) 08/11/2019   CALCIUM 11.1 (H) 10/28/2018   CALCIUM 11.1 (H) 10/28/2018   CALCIUM 11.6 (H) 11/27/2017   CALCIUM 10.9 (H) 11/11/2017   Her vitamin D level finally normalized in 08/2019 so we could further investigate her hyperparathyroidism.  Thyroid ultrasound (09/09/2019): Parenchymal Echotexture: Mildly heterogenous Isthmus: 0.5 cm Right lobe: 6.0 x 3.0 x 3.1 cm Left lobe: 4.1 x 1.5 x 1.3 cm _________________________________________________________  Estimated total number of nodules >/= 1 cm: 1 Number of spongiform nodules >/=  2 cm not described below (TR1): 0 Number of mixed cystic and solid nodules >/= 1.5 cm not described below (TR2): 0 _________________________________________________________  Nodule # 1: Location: Right; Mid Maximum size: 4.6 cm; Other 2 dimensions: 3.2 x 2.4 cm Composition: solid/almost completely solid (2) Echogenicity: isoechoic (1) Echogenic foci: peripheral calcifications (2) and macrocalcifications (1 ACR TI-RADS total points: 6. ACR TI-RADS risk category: TR4 (4-6 points). ACR TI-RADS recommendations: **Given size (>/= 1.5 cm) and appearance, fine needle aspiration of this moderately suspicious nodule should be considered based on TI-RADS criteria. _______________________________________________________  IMPRESSION: Heterogeneous and slightly ill-defined 4.6 cm TI-RADS category 4 nodule occupying the majority of the right mid gland meets criteria to consider fine-needle aspiration biopsy. Biopsy is recommended.  Thyroid Bx (09/24/2019): Benign:  Clinical History: Right Mid 4.6cm; Other 2 dimensions: 3.2 x 2.4 cm  solid/almost completely solid isoechoic TI-RADS - 6  Specimen Submitted: A. THYROID, RMP, FINE NEEDLE ASPIRATION:   FINAL MICROSCOPIC DIAGNOSIS:  - Consistent with benign follicular nodule ([place] category II)   SPECIMEN ADEQUACY:  Satisfactory for evaluation   A 24-hour urine  calcium level was high: Component     Latest Ref Rng & Units 09/16/2019  Creatinine, 24H Ur     0.50 - 2.15 g/24 h 1.23  Calcium, 24H Urine     mg/24 h 400 (H)   Her calcitriol level was high, while magnesium and phosphorus levels were normal: Component     Latest Ref Rng & Units 09/03/2019          Vitamin D 1, 25 (OH) Total     18 - 72 pg/mL 115 (H)  Vitamin D3 1, 25 (OH)     pg/mL 115  Vitamin D2 1, 25 (OH)     pg/mL <8  Magnesium     1.5 - 2.5 mg/dL 2.1  Phosphorus     2.3 - 4.6 mg/dL 3.0   No fractures, falls, or history of osteoporosis.  She has a history of kidney stone in 2018.  I referred the patient to surgery-Dr. Harlow Asa.  Technetium sestamibi scan (02/03/2020): Right inferior parathyroid adenoma  Right inferior parathyroidectomy and right thyroid lobectomy (04/28/2020): FINAL MICROSCOPIC DIAGNOSIS:  A. THYROID, RIGHT LOBE AND ISTHMUS, THYROIDECTOMY:  - Hypercellular parathyroid with fibrosis and calcification consistent  with adenoma (27 gm).  - Benign thyroid.   No CKD: Lab Results  Component Value Date   BUN 15 04/29/2020   BUN 13 04/18/2020   CREATININE 0.88 04/29/2020   CREATININE 0.88 04/18/2020   Previously on HCTZ, now on Lasix  +History of vitamin D deficiency: Lab Results  Component Value Date   VD25OH 46.0 09/03/2019   VD25OH 22.85 (L) 04/30/2019   VD25OH 23.14 (L) 03/11/2019   VD25OH 21 (L) 10/28/2018   VD25OH 17.47 (L) 02/26/2018   In 02/2018: She was on 1000 units vitamin D daily from multivitamins.  I advised her to increase this to 3000 units by adding another 2000 units vitamin D daily.  However, at last visit, she was still taking 1000 units daily.  I advised her to increase to 3000 units since her vitamin D level was still low.  In 02/2019, we increased to 5000 units.  On this, vitamin D level was still low in 04/2019.  At that time, we increase her vitamin D supplement to 5000 units alternating with 10,000 units every other day and  her subsequent vitamin D level returned normal. No FH of hypercalcemia, pituitary tumors, ?thyroid cancer, MEN sd's, or osteoporosis.  Both her parents had kidney stones.  Pt. also has a history of LE lymphedema L>R.  She is going to the lymphedema clinic in [place].  Pt has a h/o lap band 2010 >> initially lost approximately 100 pounds, then gained 30 back after she got married.  She is considering a gastric sleeve done in the future.  No history of hyper or hypothyroidism: Lab Results  Component Value Date   TSH 1.90 02/26/2018   ROS: Constitutional: no weight gain/+ weight loss, no fatigue, no subjective hyperthermia, no subjective hypothermia Eyes: no blurry vision, no xerophthalmia ENT: no sore throat, + see HPI Cardiovascular: no CP/no SOB/no palpitations/no leg swelling Respiratory: no cough/no SOB/no wheezing Gastrointestinal: no N/no V/no D/no C/no acid reflux Musculoskeletal: no muscle aches/+ joint aches Skin: no rashes, no hair loss Neurological: no tremors/no numbness/no tingling/no dizziness  I reviewed pt's medications, allergies, PMH, social hx, family hx, and changes were documented in the history of present illness. Otherwise, unchanged from my initial visit note.  Past Medical History:  Diagnosis  Date  . Anemia    as a child  . Asthma   . Chronic venous insufficiency 09/29/2017  . Deep venous thrombosis of lower extremity ([place]) 11/17/2015   due to [facility] per pt  . Depression   . GERD (gastroesophageal reflux disease)   . History of DVT (deep vein thrombosis)   . History of kidney stones   . Primary hyperparathyroidism ([place]) 11/28/2017  . Sleep apnea    mild no mask  . Sleep difficulties   . Thyroid disease    Past Surgical History:  Procedure Laterality Date  . CHOLECYSTECTOMY    . COLONOSCOPY  2014  . GASTRIC RESTRICTION SURGERY     lap band  . HERNIA REPAIR    . PARATHYROIDECTOMY Right 04/28/2020   Procedure: RIGHT  PARATHYROIDECTOMY;  Surgeon:  Armandina Gemma, MD;  Location: [facility];  Service: General;  Laterality: Right;  . THYROID LOBECTOMY Right 04/28/2020   Procedure: RIGHT THYROID LOBECTOMY;  Surgeon: Armandina Gemma, MD;  Location: [facility];  Service: General;  Laterality: Right;   Social History   Socioeconomic History  . Marital status: Married    Spouse name: Not on file  . Number of children: 2  . Years of education: Not on file  . Highest education level: Not on file  Occupational History  .  Radiology tech  Tobacco Use  . Smoking status: Never Smoker  . Smokeless tobacco: Never Used  Substance and Sexual Activity  . Alcohol use:  Yes, socially, 4-5 times yearly, liquor/wine  . Drug use: No  . Sexual activity: Yes    Birth control/protection: Post-menopausal   Current Outpatient Medications on File Prior to Visit  Medication Sig Dispense Refill  . albuterol (VENTOLIN HFA) 108 (90 Base) MCG/ACT inhaler Inhale 2 puffs into the lungs every 4 (four) hours as needed for wheezing or shortness of breath (and cough x 3 days, then prn). (Patient taking differently: Inhale 2 puffs into the lungs every 4 (four) hours as needed for wheezing or shortness of breath.) 1 Inhaler 0  . AMBULATORY NON FORMULARY MEDICATION Knee-high, medium compression, graduated compression stockings. Apply to bilateral lower extremities daily 1 each 0  . aspirin EC 81 MG tablet Take 81 mg by mouth daily.    . celecoxib (CELEBREX) 100 MG capsule 1 tab PO bid or 2 tabs PO QD (Patient taking differently: Take 100 mg by mouth 2 (two) times daily.) 60 capsule 2  . Cholecalciferol (VITAMIN D) 125 MCG (5000 UT) CAPS Take 5,000 Units by mouth every other day.    . Elastic Bandages & Supports (T.E.D. BELOW KNEE/X-LARGE) MISC Apply to lower extremities daily. Remove at bedtime 2 each 0  . FLUoxetine (PROZAC) 20 MG capsule Take 1 capsule (20 mg total) by mouth daily. 30 capsule 3  . gabapentin (NEURONTIN) 300 MG capsule Take 2 capsules (600 mg total) by mouth 3  (three) times daily. 120 capsule 3  . NONFORMULARY OR COMPOUNDED ITEM Knee high medium compression stockings. Wear daily. Remove at bedtime 2 each 0  . terbinafine (LAMISIL) 250 MG tablet Take 1 tablet (250 mg total) by mouth daily. 90 tablet 0  . traMADol (ULTRAM) 50 MG tablet Take 1-2 tablets (50-100 mg total) by mouth every 6 (six) hours as needed for moderate pain. 15 tablet 0  . vitamin B-12 (CYANOCOBALAMIN) 1000 MCG tablet Take 1,000 mcg by mouth daily.    Marland Kitchen zolpidem (AMBIEN) 5 MG tablet Take 1 tablet (5 mg total) by mouth at bedtime  as needed. For sleep 30 tablet 3  . cephALEXin (KEFLEX) 500 MG capsule Take 1 capsule (500 mg total) by mouth 3 (three) times daily. (Patient not taking: No sig reported) 21 capsule 0  . Cholecalciferol (VITAMIN D3) 50 MCG (2000 UT) TABS Take 6,000 Units by mouth See admin instructions. Take 6000 units by mouth twice daily on Monday, Wednesday and Friday, take 6000 units daily on Tuesday, Thursday, Saturday and Sunday (Patient not taking: Reported on 06/24/2020)     No current facility-administered medications on file prior to visit.   Allergies  Allergen Reactions  . Latex Rash    Where touched and will spread.  Marland Kitchen Penicillin G Itching   Family History  Problem Relation Age of Onset  . Stroke Mother   . Hypertension Mother   . Heart disease Father   . Heart attack Father   . Alcohol abuse Father   . Prostate cancer Father   . Uterine cancer Maternal Grandmother     PE: BP 120/82   Pulse 65   Ht 5\' 2"  (1.575 m)   Wt 286 lb 3.2 oz (129.8 kg)   LMP 04/24/2014   SpO2 98%   BMI 52.35 kg/m  Wt Readings from Last 3 Encounters:  06/24/20 286 lb 3.2 oz (129.8 kg)  04/30/20 297 lb (134.7 kg)  04/28/20 293 lb (132.9 kg)   Constitutional: overweight, in NAD Eyes: PERRLA, EOMI, no exophthalmos ENT: moist mucous membranes, no thyromegaly, no cervical lymphadenopathy Cardiovascular: RRR, No MRG, + B LE edma Respiratory: CTA B Gastrointestinal: abdomen  soft, NT, ND, BS+ Musculoskeletal: no deformities, strength intact in all 4 Skin: moist, warm, no rashes Neurological: no tremor with outstretched hands, DTR normal in all 4  Assessment: 1. Hypercalcemia/hyperparathyroidism  2.  Vitamin D deficiency  3.  Right thyroid fullness  Plan: Patient with history of significant hypercalcemia, with the highest blood sugar of 11.8 and also very high parathyroid hormone at 316 for a calcium of 11.6.  When I saw the patient in the past, her vitamin D level was too low so we could not proceed with surgery right away.  We started her on supplementation and vitamin D slowly improved on 10,000 units alternating with 5000 units every other day. -We confirm primary hyperparathyroidism after 24-hour urine calcium was elevated, and calcitriol was also elevated.  Magnesium and phosphorus levels were normal. -We checked a thyroid ultrasound that showed a possible parathyroid adenoma and also right thyroid nodule.  We biopsied the nodule with benign results. -I referred her to Dr. Harlow Asa who checked a Tc sestamibi scan which was positive for right inferior parathyroid adenoma. -The surgery was initially planned for 01/2020 but on 02/10/2020 she triggered a code stroke after she presented with left-sided paresthesia and weakness.  A CT scan of the head and also an MRI of the brain were negative. -She had right parathyroidectomy and right thyroid lobectomy on 04/28/2020.  Pathology showed hypercellular parathyroid with fibrosis and calcifications consistent with adenoma (27 g).  This nodule measured 3.5 x 3 x 2.7 cm. -Calcium dropped from 11.6 before surgery to 8.7 after surgery.  PTH dropped from 207  before surgery to 33. -The next day after surgery, on 04/30/2020, she presented to the emergency room with hypercalcemia symptoms (tingling and muscle cramps), but labs could not be drawn at that time and she left.  She was started on Tums. Tums were stopped afterwards.  However, she is now using these as needed, approximately 3 times a  week. -At this visit, she describes that she still occasionally has tingling in her hands and midface and also muscle cramps but all of these are milder compared to before. She does describe tingling in her thighs and legs when urinating (only) but I believe that this is related to the rim of the commode pressing on her thighs. Overall, she feels much better compared to before the surgery: Her generalized aches and pains, constipation, mental fog, all improved. -We will check a vitamin D, ionized calcium, PTH today -We discussed that if the calcium level is low, I would suggest to start Tums on a daily basis, however, if it is normal, we can continue these just on an as-needed basis - I will see the patient back in 3 months.  2.  Vitamin D deficiency -Latest vitamin D level was normal -We will continue supplementation: 10,000 units alternating with 5000 units qod -Recheck vitamin D level today  3.  Right thyroid nodule -We biopsied the nodule and this was benign -She had a right hemithyroidectomy since then and the pathology was benign -We will recheck her TFTs today to see if she needs replacement with levothyroxine -We discussed that if the TSH is elevated, we may need to start LT4 and I advised her how to take this correctly: every day, with water, at least 30 minutes before breakfast, separated by at least 4 hours from: - acid reflux medications - calcium - iron - multivitamins  Orders Placed This Encounter  Procedures  . TSH  . T4, free  . T3, free  . Calcium, ionized  . Parathyroid hormone, intact (no Ca)  . VITAMIN D 25 Hydroxy (Vit-D Deficiency, Fractures)   Component     Latest Ref Rng & Units 06/24/2020  PTH, Intact     15 - 65 pg/mL 36  TSH     0.450 - 4.500 uIU/mL 3.930  Vitamin D, 25-Hydroxy     30.0 - 100.0 ng/mL 24.9 (L)  Calcium Ionized     4.5 - 5.6 mg/dL 4.7  T4,Free(Direct)     0.82 - 1.77  ng/dL 1.17  Triiodothyronine,Free,Serum     2.0 - 4.4 pg/mL 2.8   Labs are at goal with the exception of a low vitamin D.  I will check with her if she is taking this consistently. Her calcium level is normal, as is her PTH.  For now, we will continue with Tums on a prn basis.  Philemon Kingdom, MD PhD [facility]

## 2020-06-24 NOTE — Patient Instructions (Signed)
Please stop at the lab.  Continue vitamin D at the same dose.  Please come back for a follow-up appointment in 3 months.

## 2020-06-26 ENCOUNTER — Other Ambulatory Visit (HOSPITAL_COMMUNITY): Payer: Self-pay

## 2020-06-26 LAB — CALCIUM, IONIZED: Calcium, Ion: 4.7 mg/dL (ref 4.5–5.6)

## 2020-06-26 LAB — T3, FREE: T3, Free: 2.8 pg/mL (ref 2.0–4.4)

## 2020-06-26 LAB — TSH: TSH: 3.93 u[IU]/mL (ref 0.450–4.500)

## 2020-06-26 LAB — VITAMIN D 25 HYDROXY (VIT D DEFICIENCY, FRACTURES): Vit D, 25-Hydroxy: 24.9 ng/mL — ABNORMAL LOW (ref 30.0–100.0)

## 2020-06-26 LAB — T4, FREE: Free T4: 1.17 ng/dL (ref 0.82–1.77)

## 2020-06-26 LAB — PARATHYROID HORMONE, INTACT (NO CA): PTH: 36 pg/mL (ref 15–65)

## 2020-06-27 MED FILL — ONDANSETRON ODT 4 MG TABLET: 4 | 4 days supply | Qty: 12 | Fill #0

## 2020-06-27 MED FILL — BUTALB-ACETAMIN-CAFF 50-325: 50-325-40 | 3 days supply | Qty: 12 | Fill #0

## 2020-07-05 MED FILL — FLUoxetine HCL 20 MG CAPS: 20 | 30 days supply | Qty: 30 | Fill #3

## 2020-07-19 ENCOUNTER — Ambulatory Visit: Payer: No Typology Code available for payment source | Admitting: Internal Medicine

## 2020-07-21 ENCOUNTER — Encounter: Payer: Self-pay | Admitting: Medical-Surgical

## 2020-07-21 ENCOUNTER — Other Ambulatory Visit: Payer: Self-pay | Admitting: Medical-Surgical

## 2020-07-21 ENCOUNTER — Ambulatory Visit (INDEPENDENT_AMBULATORY_CARE_PROVIDER_SITE_OTHER): Payer: No Typology Code available for payment source | Admitting: Medical-Surgical

## 2020-07-21 ENCOUNTER — Other Ambulatory Visit: Payer: Self-pay

## 2020-07-21 VITALS — BP 106/69 | HR 65 | Temp 99.1°F | Ht 66.0 in | Wt 281.6 lb

## 2020-07-21 DIAGNOSIS — G43109 Migraine with aura, not intractable, without status migrainosus: Secondary | ICD-10-CM

## 2020-07-21 DIAGNOSIS — Z7689 Persons encountering health services in other specified circumstances: Secondary | ICD-10-CM | POA: Diagnosis not present

## 2020-07-21 DIAGNOSIS — F439 Reaction to severe stress, unspecified: Secondary | ICD-10-CM

## 2020-07-21 DIAGNOSIS — M25551 Pain in right hip: Secondary | ICD-10-CM

## 2020-07-21 MED ORDER — CELECOXIB 100 MG PO CAPS: 100.0000 mg | ORAL_CAPSULE | Freq: Two times a day (BID) | ORAL | 2 refills | Status: DC

## 2020-07-21 MED ORDER — METOPROLOL TARTRATE 25 MG PO TABS: 25.0000 mg | ORAL_TABLET | Freq: Two times a day (BID) | ORAL | 1 refills | Status: DC

## 2020-07-21 MED ORDER — FLUOXETINE HCL 20 MG PO CAPS: 20.0000 mg | ORAL_CAPSULE | Freq: Every day | ORAL | 3 refills | Status: DC

## 2020-07-21 MED FILL — METOPROLOL TARTRATE 25 MG T: 25 | 30 days supply | Qty: 60 | Fill #0

## 2020-07-21 MED FILL — CELECOXIB 100 MG CAPS: 100 | 30 days supply | Qty: 60 | Fill #0

## 2020-07-21 NOTE — Progress Notes (Signed)
Subjective:    CC: establish care, migraines  HPI: Pleasant 55 year old female presenting today to establish care with a new PCP after [place] Mouton Early moved to a new location.  She is concerned about migraine headaches and was recently seen at the emergency room on 2/6.  They discharged her with Fioricet to use twice daily as needed. Has tried the Fioricet and notes some relief. Notes that she is having approximately 2 migraines a week that are accompanied by muffled sounds, photophobia, and if severe nausea/vomiting. Pain is preceded by visual aura that looks like stars in her peripheral vision and a heaviness in her posterior head and upper neck. Feels that these headaches are related to tension. Has never tried any preventative medications or other abortive agents. No neurological concerns or deficits noted when she has a migraine.   Needs refills on fluoxetine and celebrex today.   I reviewed the past medical history, family history, social history, surgical history, and allergies today and no changes were needed.  Please see the problem list section below in epic for further details.  Past Medical History: Past Medical History:  Diagnosis Date  . Anemia    as a child  . Asthma   . Chronic venous insufficiency 09/29/2017  . Deep venous thrombosis of lower extremity ([place]) 11/17/2015   due to [facility] per pt  . Depression   . GERD (gastroesophageal reflux disease)   . History of DVT (deep vein thrombosis)   . History of kidney stones   . Primary hyperparathyroidism ([place]) 11/28/2017  . Sleep apnea    mild no mask  . Sleep difficulties   . Thyroid disease    Past Surgical History: Past Surgical History:  Procedure Laterality Date  . CHOLECYSTECTOMY    . COLONOSCOPY  2014  . GASTRIC RESTRICTION SURGERY     lap band  . HERNIA REPAIR    . PARATHYROIDECTOMY Right 04/28/2020   Procedure: RIGHT  PARATHYROIDECTOMY;  Surgeon: Armandina Gemma, MD;  Location: [facility];  Service: General;   Laterality: Right;  . THYROID LOBECTOMY Right 04/28/2020   Procedure: RIGHT THYROID LOBECTOMY;  Surgeon: Armandina Gemma, MD;  Location: [facility];  Service: General;  Laterality: Right;   Social History: Social History   Socioeconomic History  . Marital status: Married    Spouse name: Not on file  . Number of children: Not on file  . Years of education: Not on file  . Highest education level: Not on file  Occupational History  . Not on file  Tobacco Use  . Smoking status: Never Smoker  . Smokeless tobacco: Never Used  Vaping Use  . Vaping Use: Never used  Substance and Sexual Activity  . Alcohol use: No    Comment: social  . Drug use: No  . Sexual activity: Yes    Birth control/protection: Post-menopausal  Other Topics Concern  . Not on file  Social History Narrative  . Not on file   Social Determinants of Health   Financial Resource Strain: Not on file  Food Insecurity: Not on file  Transportation Needs: Not on file  Physical Activity: Not on file  Stress: Not on file  Social Connections: Not on file   Family History: Family History  Problem Relation Age of Onset  . Stroke Mother   . Hypertension Mother   . Heart disease Father   . Heart attack Father   . Alcohol abuse Father   . Prostate cancer Father   . Uterine cancer Maternal  Grandmother    Allergies: Allergies  Allergen Reactions  . Latex Rash    Where touched and will spread.  Marland Kitchen Penicillin G Itching   Medications: See med rec.  Review of Systems: See HPI for pertinent positives and negatives.   Objective:    General: Well Developed, well nourished, and in no acute distress.  Neuro: Alert and oriented x3.  HEENT: Normocephalic, atraumatic.  Skin: Warm and dry. Cardiac: Regular rate and rhythm, no murmurs rubs or gallops, no lower extremity edema.  Respiratory: Clear to auscultation bilaterally. Not using accessory muscles, speaking in full sentences.  Impression and Recommendations:    1.  Encounter to establish care Reviewed available information and discussed healthcare concerns with patient.  2. Right hip pain Continue Celebrex 100 mg twice daily. - celecoxib (CELEBREX) 100 MG capsule; Take 1 capsule (100 mg total) by mouth 2 (two) times daily.  Dispense: 60 capsule; Refill: 2  3. Stress Continue fluoxetine 20 mg daily. - FLUoxetine (PROZAC) 20 MG capsule; Take 1 capsule (20 mg total) by mouth daily.  Dispense: 30 capsule; Refill: 3  4. Migraine with aura and without status migrainosus, not intractable Discussed preventative versus abortive medications.  She does have Fioricet at home for an abortive agent.  Since she endorses some periodic palpitations/increased heart rate, we will go ahead and start with metoprolol 25 mg twice daily for prevention.  Return for HA follow up in 3-4 weeks (virtual is fine). ___________________________________________ Clearnce Sorrel, DNP, APRN, FNP-BC [facility] Sports Medicine [place]

## 2020-07-29 MED FILL — FLUoxetine HCL 20 MG CAPS: 20 | 30 days supply | Qty: 30 | Fill #0

## 2020-08-18 ENCOUNTER — Encounter: Payer: Self-pay | Admitting: Medical-Surgical

## 2020-08-18 ENCOUNTER — Telehealth (INDEPENDENT_AMBULATORY_CARE_PROVIDER_SITE_OTHER): Payer: No Typology Code available for payment source | Admitting: Medical-Surgical

## 2020-08-18 DIAGNOSIS — G43109 Migraine with aura, not intractable, without status migrainosus: Secondary | ICD-10-CM | POA: Diagnosis not present

## 2020-08-18 NOTE — Progress Notes (Signed)
Virtual Visit via Video Note  I connected with Cynthia [name] on 08/18/20 at 10:30 AM EDT by a video enabled telemedicine application and verified that I am speaking with the correct person using two identifiers.   I discussed the limitations of evaluation and management by telemedicine and the availability of in person appointments. The patient expressed understanding and agreed to proceed.  Patient location: home Provider locations: office  Subjective:    CC: Headache follow-up  HPI: Pleasant 55 year old female presenting via [place] video visit for follow-up on headaches.  She was seen approximately 4 weeks ago to establish care and started on metoprolol 25 mg twice daily for migraine prophylaxis.  She started the medication at the prescribed dose but noted that she had feelings of sluggishness as well as occasional palpitations which were bothersome.  She decided to cut the pill in half and has been taking 12.5 mg twice daily as needed.  Notes that she does only take this approximately 3 times weekly on average but has noticed a significant decrease in her headache frequency.  Over the past 4 weeks she has only had 1 headache which was notably stress induced.  On the new dose of medication, she has had no noticeable side effects.  Past medical history, Surgical history, Family history not pertinant except as noted below, Social history, Allergies, and medications have been entered into the medical record, reviewed, and corrections made.   Review of Systems: See HPI for pertinent positives and negatives.   Objective:    General: Speaking clearly in complete sentences without any shortness of breath.  Alert and oriented x3.  Normal judgment. No apparent acute distress.  Impression and Recommendations:    1. Migraine with aura and without status migrainosus, not intractable Doing well on metoprolol for migraine prophylaxis.  Continue metoprolol 12.5 mg twice daily but okay to use  on an as-needed basis.  I discussed the assessment and treatment plan with the patient. The patient was provided an opportunity to ask questions and all were answered. The patient agreed with the plan and demonstrated an understanding of the instructions.   The patient was advised to call back or seek an in-person evaluation if the symptoms worsen or if the condition fails to improve as anticipated.  15 minutes of non-face-to-face time was provided during this encounter.  Return in about 3 months (around 11/17/2020) for Chronic condition follow-up.  Clearnce Sorrel, DNP, APRN, FNP-BC [place] [facility]

## 2020-09-26 ENCOUNTER — Ambulatory Visit (INDEPENDENT_AMBULATORY_CARE_PROVIDER_SITE_OTHER): Payer: No Typology Code available for payment source | Admitting: Internal Medicine

## 2020-09-26 ENCOUNTER — Other Ambulatory Visit: Payer: Self-pay

## 2020-09-26 ENCOUNTER — Encounter: Payer: Self-pay | Admitting: Internal Medicine

## 2020-09-26 VITALS — BP 128/80 | HR 59 | Ht 66.0 in | Wt 287.6 lb

## 2020-09-26 DIAGNOSIS — E559 Vitamin D deficiency, unspecified: Secondary | ICD-10-CM | POA: Diagnosis not present

## 2020-09-26 DIAGNOSIS — E890 Postprocedural hypothyroidism: Secondary | ICD-10-CM | POA: Diagnosis not present

## 2020-09-26 DIAGNOSIS — E213 Hyperparathyroidism, unspecified: Secondary | ICD-10-CM

## 2020-09-26 LAB — TSH: TSH: 5.64 u[IU]/mL — ABNORMAL HIGH (ref 0.35–4.50)

## 2020-09-26 LAB — T4, FREE: Free T4: 0.82 ng/dL (ref 0.60–1.60)

## 2020-09-26 LAB — CALCIUM: Calcium: 9.5 mg/dL (ref 8.4–10.5)

## 2020-09-26 LAB — T3, FREE: T3, Free: 3 pg/mL (ref 2.3–4.2)

## 2020-09-26 NOTE — Patient Instructions (Addendum)
Please stop at the lab.  Continue vitamin D at the same dose. Try to add vitamin K2.  Continue Tums as needed.  Please come back for a follow-up appointment in 6 months.

## 2020-09-26 NOTE — Progress Notes (Signed)
Patient ID: Cynthia [name], female   DOB: March 02, 1966, 55 y.o.   MRN: [id number]   This visit occurred during the SARS-CoV-2 public health emergency.  Safety protocols were in place, including screening questions prior to the visit, additional usage of staff PPE, and extensive cleaning of exam room while observing appropriate contact time as indicated for disinfecting solutions.   HPI  Cynthia [name] is a 55 y.o.-year-old female returning for follow-up for hypercalcemia/hyperparathyroidism and vitamin D deficiency.  Last visit 3 months ago.  Interim history: Patient had right parathyroidectomy and right thyroid lobectomy for a thyroid nodule in 04/2020 by Dr. Harlow [name].  She started to feel much better after the surgery, with resolved constipation, mental fog and generalized aches and pains. She had transient tingling in his hands and face, resolved with times, and at last visit she was still having some tingling but much milder.  She still has these every once in a while and takes a Tums tablet, after which the symptoms resolve. She also had occasional muscle cramps, resolved with Tums.  The cramps have now resolved. At this visit, she mentions hair loss, but otherwise, she feels well, without complaints.  Reviewed and addended history: She was diagnosed with hypercalcemia in 2012 and she was found to have a very high PTH in 11/2017.  This was confirmed in 10/2018.  It was difficult to improve her vitamin D level to further investigate and treat her primary hyperparathyroidism, but this was finally confirmed and she had right inferior parathyroidectomy on 04/28/2020.  See investigation below.  During imaging investigation for her hyperparathyroidism she was found to have a right thyroid nodule -right hemithyroidectomy was performed and the final pathology was benign.  Reviewed pertinent results: Component     Latest Ref Rng & Units 06/24/2020  Calcium Ionized     4.5 - 5.6 mg/dL 4.7    Postop: Calcium 8.7, PTH 33 Lab Results  Component Value Date   PTH 36 06/24/2020   PTH 116 (H) 09/03/2019   PTH Comment 09/03/2019   PTH 207 (H) 10/28/2018   PTH 316 (H) 11/27/2017   CALCIUM 9.4 04/29/2020   CALCIUM 10.8 (H) 04/18/2020   CALCIUM 11.6 (H) 02/24/2020   CALCIUM 11.6 (H) 02/09/2020   CALCIUM 11.5 (H) 09/03/2019   CALCIUM 11.8 (H) 08/11/2019   CALCIUM 11.1 (H) 10/28/2018   CALCIUM 11.1 (H) 10/28/2018   CALCIUM 11.6 (H) 11/27/2017   CALCIUM 10.9 (H) 11/11/2017   Her vitamin D level finally normalized in 08/2019 so we could further investigate her hyperparathyroidism.  Thyroid ultrasound (09/09/2019): Parenchymal Echotexture: Mildly heterogenous Isthmus: 0.5 cm Right lobe: 6.0 x 3.0 x 3.1 cm Left lobe: 4.1 x 1.5 x 1.3 cm _________________________________________________________  Estimated total number of nodules >/= 1 cm: 1 Number of spongiform nodules >/=  2 cm not described below (TR1): 0 Number of mixed cystic and solid nodules >/= 1.5 cm not described below (TR2): 0 _________________________________________________________  Nodule # 1: Location: Right; Mid Maximum size: 4.6 cm; Other 2 dimensions: 3.2 x 2.4 cm Composition: solid/almost completely solid (2) Echogenicity: isoechoic (1) Echogenic foci: peripheral calcifications (2) and macrocalcifications (1 ACR TI-RADS total points: 6. ACR TI-RADS risk category: TR4 (4-6 points). ACR TI-RADS recommendations: **Given size (>/= 1.5 cm) and appearance, fine needle aspiration of this moderately suspicious nodule should be considered based on TI-RADS criteria. _______________________________________________________  IMPRESSION: Heterogeneous and slightly ill-defined 4.6 cm TI-RADS category 4 nodule occupying the majority of the right mid gland meets criteria to  consider fine-needle aspiration biopsy. Biopsy is recommended.  Thyroid Bx (09/24/2019): Benign:  Clinical History: Right Mid 4.6cm;  Other 2 dimensions: 3.2 x 2.4 cm  solid/almost completely solid isoechoic TI-RADS - 6  Specimen Submitted: A. THYROID, RMP, FINE NEEDLE ASPIRATION:   FINAL MICROSCOPIC DIAGNOSIS:  - Consistent with benign follicular nodule ([facility] category II)   SPECIMEN ADEQUACY:  Satisfactory for evaluation   A 24-hour urine calcium level was high: Component     Latest Ref Rng & Units 09/16/2019  Creatinine, 24H Ur     0.50 - 2.15 g/24 h 1.23  Calcium, 24H Urine     mg/24 h 400 (H)   Her calcitriol level was high, while magnesium and phosphorus levels were normal: Component     Latest Ref Rng & Units 09/03/2019          Vitamin D 1, 25 (OH) Total     18 - 72 pg/mL 115 (H)  Vitamin D3 1, 25 (OH)     pg/mL 115  Vitamin D2 1, 25 (OH)     pg/mL <8  Magnesium     1.5 - 2.5 mg/dL 2.1  Phosphorus     2.3 - 4.6 mg/dL 3.0   Technetium sestamibi scan (02/03/2020): Right inferior parathyroid adenoma  Right inferior parathyroidectomy and right thyroid lobectomy (04/28/2020): FINAL MICROSCOPIC DIAGNOSIS:  A. THYROID, RIGHT LOBE AND ISTHMUS, THYROIDECTOMY:  - Hypercellular parathyroid with fibrosis and calcification consistent  with adenoma (27 gm).  - Benign thyroid.   The surgery was initially planned for 01/2020 but on 02/10/2020 she triggered a code stroke after she presented with left-sided paresthesia and weakness.  A CT scan of the head and also an MRI of the brain were negative.  The next day after surgery, on 04/30/2020, she presented to the emergency room with hypocalcemia symptoms: Tingling and muscle cramps but labs could not be drawn immediately so she left.  She was started on Tums >> now Prn.  No CKD: Lab Results  Component Value Date   BUN 15 04/29/2020   BUN 13 04/18/2020   CREATININE 0.88 04/29/2020   CREATININE 0.88 04/18/2020   Previously on HCTZ, now on Lasix. She has a history of kidney stone in 2018.  +History of vitamin D deficiency: Lab Results  Component Value  Date   VD25OH 24.9 (L) 06/24/2020   VD25OH 46.0 09/03/2019   VD25OH 22.85 (L) 04/30/2019   VD25OH 23.14 (L) 03/11/2019   VD25OH 21 (L) 10/28/2018   VD25OH 17.47 (L) 02/26/2018   In 02/2018: She was on 1000 units vitamin D daily from multivitamins.  I advised her to increase this to 3000 units by adding another 2000 units vitamin D daily.  However, at last visit, she was still taking 1000 units daily.  I advised her to increase to 3000 units since her vitamin D level was still low.  In 02/2019, we increased to 5000 units.  On this, vitamin D level was still low in 04/2019.  At that time, we increase her vitamin D supplement to 5000 units alternating with 10,000 units every other day and her subsequent vitamin D level returned normal, but in 06/2020, this was again low.  No FH of hypercalcemia, pituitary tumors, ?thyroid cancer, MEN sd's, or osteoporosis.  Both her parents had kidney stones.  No fractures, falls, or history of osteoporosis.  Pt. also has a history of LE lymphedema L>R.  She is going to the lymphedema clinic in [place].  Pt has a h/o lap  band 2010 >> initially lost approximately 100 pounds, then gained 30 back after she got married.  She is considering a gastric sleeve done in the future.  No history of hyper or hypothyroidism: Lab Results  Component Value Date   TSH 3.930 06/24/2020   ROS: Constitutional: no weight gain/no weight loss, no fatigue, no subjective hyperthermia, no subjective hypothermia Eyes: no blurry vision, no xerophthalmia ENT: no sore throat, + see HPI Cardiovascular: no CP/no SOB/no palpitations/+ leg swelling Respiratory: no cough/no SOB/no wheezing Gastrointestinal: no N/no V/no D/resolved C/no acid reflux Musculoskeletal: no muscle aches/+ joint aches Skin: no rashes, + hair loss Neurological: no tremors/no numbness/no tingling/no dizziness  I reviewed pt's medications, allergies, PMH, social hx, family hx, and changes were documented in the  history of present illness. Otherwise, unchanged from my initial visit note.  Past Medical History:  Diagnosis Date  . Anemia    as a child  . Asthma   . Chronic venous insufficiency 09/29/2017  . Deep venous thrombosis of lower extremity ([place]) 11/17/2015   due to [facility] per pt  . Depression   . GERD (gastroesophageal reflux disease)   . History of DVT (deep vein thrombosis)   . History of kidney stones   . Primary hyperparathyroidism ([place]) 11/28/2017  . Sleep apnea    mild no mask  . Sleep difficulties   . Thyroid disease    Past Surgical History:  Procedure Laterality Date  . CHOLECYSTECTOMY    . COLONOSCOPY  2014  . GASTRIC RESTRICTION SURGERY     lap band  . HERNIA REPAIR    . PARATHYROIDECTOMY Right 04/28/2020   Procedure: RIGHT  PARATHYROIDECTOMY;  Surgeon: Armandina Gemma, MD;  Location: [facility];  Service: General;  Laterality: Right;  . THYROID LOBECTOMY Right 04/28/2020   Procedure: RIGHT THYROID LOBECTOMY;  Surgeon: Armandina Gemma, MD;  Location: [facility];  Service: General;  Laterality: Right;   Social History   Socioeconomic History  . Marital status: Married    Spouse name: Not on file  . Number of children: 2  . Years of education: Not on file  . Highest education level: Not on file  Occupational History  .  Radiology tech  Tobacco Use  . Smoking status: Never Smoker  . Smokeless tobacco: Never Used  Substance and Sexual Activity  . Alcohol use:  Yes, socially, 4-5 times yearly, liquor/wine  . Drug use: No  . Sexual activity: Yes    Birth control/protection: Post-menopausal   Current Outpatient Medications on File Prior to Visit  Medication Sig Dispense Refill  . albuterol (VENTOLIN HFA) 108 (90 Base) MCG/ACT inhaler Inhale 2 puffs into the lungs every 4 (four) hours as needed for wheezing or shortness of breath (and cough x 3 days, then prn). (Patient taking differently: Inhale 2 puffs into the lungs every 4 (four) hours as needed for wheezing or shortness of  breath.) 1 Inhaler 0  . AMBULATORY NON FORMULARY MEDICATION Knee-high, medium compression, graduated compression stockings. Apply to bilateral lower extremities daily 1 each 0  . aspirin EC 81 MG tablet Take 81 mg by mouth daily.    . butalbital-acetaminophen-caffeine (FIORICET) 50-325-40 MG tablet Take 1 tablet by mouth 2 (two) times daily as needed.    . celecoxib (CELEBREX) 100 MG capsule TAKE 1 CAPSULE BY MOUTH 2 TIMES DAILY. 60 capsule 2  . Cholecalciferol (VITAMIN D) 125 MCG (5000 UT) CAPS Take 5,000 Units by mouth every other day.    Regino Schultze Bandages & Supports (  T.E.D. BELOW KNEE/X-LARGE) MISC Apply to lower extremities daily. Remove at bedtime 2 each 0  . FLUoxetine (PROZAC) 20 MG capsule TAKE 1 CAPSULE BY MOUTH DAILY. 30 capsule 3  . metoprolol tartrate (LOPRESSOR) 25 MG tablet TAKE 1 TABLET BY MOUTH 2 TIMES DAILY. 60 tablet 1  . NONFORMULARY OR COMPOUNDED ITEM Knee high medium compression stockings. Wear daily. Remove at bedtime 2 each 0  . ondansetron (ZOFRAN-ODT) 4 MG disintegrating tablet DISSOLVE 1 TABLET BY MOUTH EVERY 8 HOURS AS NEEDED FOR UP TO 7 DAYS 12 tablet 0  . polyethylene glycol-electrolytes (NULYTELY) 420 g solution USE AS DIRECTED 4000 mL 0  . terbinafine (LAMISIL) 250 MG tablet Take 1 tablet (250 mg total) by mouth daily. 90 tablet 0  . traMADol (ULTRAM) 50 MG tablet TAKE 1-2 TABLETS BY MOUTH EVERY 6 HOURS AS NEEDED FOR MODERATE PAIN. 15 tablet 0  . vitamin B-12 (CYANOCOBALAMIN) 1000 MCG tablet Take 1,000 mcg by mouth daily.    Marland Kitchen zolpidem (AMBIEN) 5 MG tablet Take 1 tablet (5 mg total) by mouth at bedtime as needed. For sleep 30 tablet 3   No current facility-administered medications on file prior to visit.   Allergies  Allergen Reactions  . Latex Rash    Where touched and will spread.  Marland Kitchen Penicillin G Itching   Family History  Problem Relation Age of Onset  . Stroke Mother   . Hypertension Mother   . Heart disease Father   . Heart attack Father   . Alcohol  abuse Father   . Prostate cancer Father   . Uterine cancer Maternal Grandmother     PE: BP 128/80 (BP Location: Right Arm, Patient Position: Sitting, Cuff Size: Normal)   Pulse (!) 59   Ht 5\' 6"  (1.676 m)   Wt 287 lb 9.6 oz (130.5 kg)   SpO2 97%   BMI 46.42 kg/m  Wt Readings from Last 3 Encounters:  09/26/20 287 lb 9.6 oz (130.5 kg)  07/21/20 281 lb 9.6 oz (127.7 kg)  06/24/20 286 lb 3.2 oz (129.8 kg)   Constitutional: overweight, in NAD Eyes: PERRLA, EOMI, no exophthalmos ENT: moist mucous membranes, no thyromegaly, no cervical lymphadenopathy Cardiovascular: RRR, No MRG, + B LE edma Respiratory: CTA B Gastrointestinal: abdomen soft, NT, ND, BS+ Musculoskeletal: no deformities, strength intact in all 4 Skin: moist, warm, no rashes Neurological: no tremor with outstretched hands, DTR normal in all 4  Assessment: 1. Hypercalcemia/hyperparathyroidism  2.  Vitamin D deficiency  3.  Right thyroid fullness  [name]: Patient with history of significant hypercalcemia with the highest blood sugar of 11.8 and also very high parathyroid hormone, at 316, for calcium of 11.6.  Her vitamin D level was very low in the past so we could not proceed right away with surgery, however, her vitamin D level normalized last year on supplementation with high doses of vitamin D so she finally had parathyroid surgery last year: Right parathyroidectomy and she also had right thyroid lobectomy at the same time on 04/28/2020.  Pathology showed hypercellular parathyroid with fibrosis and calcifications consistent with adenoma (27 g).  This nodule measures 3.5 x 3 x 2.7 cm.  Calcium dropped from 11.6 before surgery, to 8.7 after surgery.  PTH dropped from 207 before surgery to 33.  The next day after surgery, on 04/30/2020, she presented to the emergency room with hypocalcemia symptoms: Tingling and muscle cramps but labs could not be drawn immediately so she left.  She was started on daily Tums, which  she was only  using them 3 times a week at last visit.  Symptoms were better, with only occasional tingling in her hands and in his mid face and also some muscle cramps but milder compared to previously.  Her ionized calcium was normal at last visit so I advised her to continue Tums on an as-needed basis. Overall, she felt much better after the surgery: Her generalized aches and pains, constipation, mental fog all improved. -At today's visit, she only describes occasional numbness and tingling, resolved with Tums, but she does not need to take these very frequently -we will check a vitamin D and calcium -We discussed that if the calcium level is low, to take Tums daily, but for now she can continue to take it as needed - I will see the patient back in 6 months  2.  Vitamin D deficiency -Latest vitamin D level was again low -We continued supplementation with 10,000 units alternating with 5000 units every other day -At this visit, I also suggested to add vitamin K2 to enhance vitamin D absorption -Recheck level today  3.  Right thyroid nodule -We biopsied the nodule and this was benign -She had a right hemithyroidectomy at the time of her parathyroidectomy and the final pathology was also benign -At last visit we checked her TSH and this was normal -We will recheck her TFTs today  Component     Latest Ref Rng & Units 09/26/2020          Calcium     8.4 - 10.5 mg/dL 9.5  TSH     0.35 - 4.50 uIU/mL 5.64 (H)  Vitamin D, 25-Hydroxy     30.0 - 100.0 ng/mL 41.2  T4,Free(Direct)     0.60 - 1.60 ng/dL 0.82  Triiodothyronine,Free,Serum     2.3 - 4.2 pg/mL 3.0  Vitamin D level is now normal.  We will still suggest to add a K2 vitamin. TSH is slightly high while free thyroid hormones are normal.  At this point, I would not necessarily suggest to start levothyroxine but I would like to recheck her thyroid tests again in 2 months.  Philemon Kingdom, MD PhD [facility]

## 2020-09-27 LAB — VITAMIN D 25 HYDROXY (VIT D DEFICIENCY, FRACTURES): Vit D, 25-Hydroxy: 41.2 ng/mL (ref 30.0–100.0)

## 2020-10-02 MED FILL — Fluoxetine HCl Cap 20 MG: ORAL | 30 days supply | Qty: 30 | Fill #0 | Status: AC

## 2020-10-03 ENCOUNTER — Other Ambulatory Visit (HOSPITAL_COMMUNITY): Payer: Self-pay

## 2020-11-01 ENCOUNTER — Other Ambulatory Visit (HOSPITAL_COMMUNITY): Payer: Self-pay

## 2020-11-01 MED FILL — Celecoxib Cap 100 MG: ORAL | 30 days supply | Qty: 60 | Fill #0 | Status: AC

## 2020-11-01 MED FILL — Fluoxetine HCl Cap 20 MG: ORAL | 30 days supply | Qty: 30 | Fill #1 | Status: AC

## 2020-11-24 ENCOUNTER — Other Ambulatory Visit: Payer: Self-pay

## 2020-11-24 ENCOUNTER — Other Ambulatory Visit (INDEPENDENT_AMBULATORY_CARE_PROVIDER_SITE_OTHER): Payer: No Typology Code available for payment source

## 2020-11-24 DIAGNOSIS — E890 Postprocedural hypothyroidism: Secondary | ICD-10-CM

## 2020-11-24 LAB — TSH: TSH: 3.07 u[IU]/mL (ref 0.35–5.50)

## 2020-11-24 LAB — T4, FREE: Free T4: 0.78 ng/dL (ref 0.60–1.60)

## 2020-11-24 LAB — T3, FREE: T3, Free: 3.3 pg/mL (ref 2.3–4.2)

## 2020-12-08 ENCOUNTER — Encounter: Payer: No Typology Code available for payment source | Admitting: Medical-Surgical

## 2020-12-12 ENCOUNTER — Other Ambulatory Visit: Payer: Self-pay

## 2020-12-12 ENCOUNTER — Encounter: Payer: Self-pay | Admitting: Medical-Surgical

## 2020-12-12 ENCOUNTER — Other Ambulatory Visit (HOSPITAL_COMMUNITY): Payer: Self-pay

## 2020-12-12 ENCOUNTER — Ambulatory Visit (INDEPENDENT_AMBULATORY_CARE_PROVIDER_SITE_OTHER): Payer: No Typology Code available for payment source | Admitting: Medical-Surgical

## 2020-12-12 VITALS — BP 133/80 | HR 61 | Resp 20 | Ht 66.0 in | Wt 276.0 lb

## 2020-12-12 DIAGNOSIS — Z23 Encounter for immunization: Secondary | ICD-10-CM | POA: Diagnosis not present

## 2020-12-12 DIAGNOSIS — Z833 Family history of diabetes mellitus: Secondary | ICD-10-CM

## 2020-12-12 DIAGNOSIS — F439 Reaction to severe stress, unspecified: Secondary | ICD-10-CM

## 2020-12-12 DIAGNOSIS — Z111 Encounter for screening for respiratory tuberculosis: Secondary | ICD-10-CM

## 2020-12-12 DIAGNOSIS — Z0000 Encounter for general adult medical examination without abnormal findings: Secondary | ICD-10-CM

## 2020-12-12 MED ORDER — OZEMPIC (0.25 OR 0.5 MG/DOSE) 2 MG/1.5ML ~~LOC~~ SOPN
0.2500 mg | PEN_INJECTOR | SUBCUTANEOUS | 0 refills | Status: DC
  Filled 2020-12-12: qty 1.5, 56d supply, fill #0

## 2020-12-12 MED ORDER — FLUOXETINE HCL 20 MG PO CAPS
ORAL_CAPSULE | Freq: Every day | ORAL | 3 refills | Status: DC
  Filled 2020-12-12: qty 90, 90d supply, fill #0
  Filled 2021-05-23: qty 90, 90d supply, fill #1

## 2020-12-12 NOTE — Progress Notes (Signed)
HPI: Cynthia [name] is a 55 y.o. female who  has a past medical history of Anemia, Asthma, Chronic venous insufficiency (09/29/2017), Deep venous thrombosis of lower extremity (HCC) (11/17/2015), Depression, GERD (gastroesophageal reflux disease), History of DVT (deep vein thrombosis), History of kidney stones, Primary hyperparathyroidism ([place]) (11/28/2017), Sleep apnea, Sleep difficulties, and Thyroid disease.  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 Feeling much better since having her parathyroid surgery.   Past medical, surgical, social and family history reviewed:  Patient Active Problem List   Diagnosis Date Noted   Primary hyperparathyroidism ([place]) 04/28/2020   Right thyroid nodule 02/28/2020   Depressive disorder 03/18/2019   Venous insufficiency of both lower extremities 11/11/2018   Class 3 severe obesity due to excess calories with serious comorbidity and body mass index (BMI) of 45.0 to 49.9 in adult ([place]) 11/11/2018   Encounter for weight management 11/11/2018   Shortness of breath 10/23/2018   Chronic cough 10/23/2018   Bradycardia with 51-60 beats per minute 10/09/2018   Laryngospasms 10/09/2018   Dyspepsia 10/09/2018   Symptom of wheezing 10/09/2018   Persistent cough for 3 weeks or longer 10/09/2018   Bronchospasm, acute 05/27/2018   Hyperparathyroidism, primary ([place]) 11/28/2017   Microcytic anemia 11/13/2017   Low mean corpuscular volume (MCV) 11/13/2017   Hypercalcemia 11/13/2017   Sacroiliac joint dysfunction of both sides 10/21/2017   Lumbar degenerative disc  disease 10/21/2017   Abnormal weight gain 10/21/2017   Lymphedema 09/29/2017   Primary osteoarthritis of left knee 09/19/2017   Peripheral edema 09/19/2017   Morbid obesity with BMI of 40.0-44.9, adult ([place]) 09/18/2017   Urinary incontinence 09/18/2017   History of DVT (deep vein thrombosis)    Lesion of breast 06/18/2017   LGSIL on Pap smear of cervix 11/22/2015   Migraine 11/17/2015   Vitamin D deficiency 11/17/2015   Constipation-normal barium enema Sept 2013 02/06/2012   Lapband APL + HH repair (1 suture) 01/28/2012    Past Surgical History:  Procedure Laterality Date   CHOLECYSTECTOMY     COLONOSCOPY  2014   GASTRIC RESTRICTION SURGERY     lap band   HERNIA REPAIR     PARATHYROIDECTOMY Right 04/28/2020   Procedure: RIGHT  PARATHYROIDECTOMY;  Surgeon: Armandina Gemma, MD;  Location: [facility];  Service: General;  Laterality: Right;   THYROID LOBECTOMY Right 04/28/2020   Procedure: RIGHT THYROID LOBECTOMY;  Surgeon: Armandina Gemma, MD;  Location: [facility];  Service: General;  Laterality: Right;    Social History   Tobacco Use   Smoking status: Never   Smokeless tobacco: Never  Substance Use Topics   Alcohol use: No    Comment: social    Family History  Problem Relation Age of Onset   Stroke Mother    Hypertension Mother    Heart disease Father    Heart attack Father    Alcohol abuse Father    Prostate cancer Father    Uterine cancer Maternal Grandmother      Current medication list and allergy/intolerance information reviewed:    Current Outpatient Medications  Medication Sig Dispense Refill   albuterol (VENTOLIN HFA) 108 (90 Base) MCG/ACT inhaler  Inhale 2 puffs into the lungs every 4 (four) hours as needed for wheezing or shortness of breath (and cough x 3 days, then prn). (Patient taking differently: Inhale 2 puffs into the lungs every 4 (four) hours as needed for wheezing or shortness of breath.) 1 Inhaler 0   AMBULATORY NON FORMULARY MEDICATION Knee-high, medium  compression, graduated compression stockings. Apply to bilateral lower extremities daily 1 each 0   aspirin EC 81 MG tablet Take 81 mg by mouth daily.     celecoxib (CELEBREX) 100 MG capsule TAKE 1 CAPSULE BY MOUTH 2 TIMES DAILY. 60 capsule 2   Cholecalciferol (VITAMIN D) 125 MCG (5000 UT) CAPS Take 5,000 Units by mouth every other day.     Docusate Sodium (COLACE PO) Take 100 mg by mouth daily.     Elastic Bandages & Supports (T.E.D. BELOW KNEE/X-LARGE) MISC Apply to lower extremities daily. Remove at bedtime 2 each 0   metoprolol tartrate (LOPRESSOR) 25 MG tablet TAKE 1 TABLET BY MOUTH 2 TIMES DAILY. (Patient taking differently: Take 12.5 mg by mouth 2 (two) times daily.) 60 tablet 1   NONFORMULARY OR COMPOUNDED ITEM Knee high medium compression stockings. Wear daily. Remove at bedtime 2 each 0   Semaglutide,0.25 or 0.'5MG'$ /DOS, (OZEMPIC, 0.25 OR 0.5 MG/DOSE,) 2 MG/1.5ML SOPN Inject 0.25 mg into the skin once a week. 1.5 mL 0   vitamin B-12 (CYANOCOBALAMIN) 1000 MCG tablet Take 1,000 mcg by mouth daily.     zolpidem (AMBIEN) 5 MG tablet Take 1 tablet (5 mg total) by mouth at bedtime as needed. For sleep 30 tablet 3   FLUoxetine (PROZAC) 20 MG capsule TAKE 1 CAPSULE BY MOUTH DAILY. 90 capsule 3   No current facility-administered medications for this visit.    Allergies  Allergen Reactions   Latex Rash    Where touched and will spread.   Penicillin G Itching      Review of Systems: Constitutional:  No  fever, no chills, No recent illness, No unintentional weight changes. No significant fatigue.  HEENT: No  headache, no vision change, no hearing change, No sore throat, No  sinus pressure Cardiac: No  chest pain, No  pressure, No palpitations, No  Orthopnea Respiratory:  No  shortness of breath. No  Cough Gastrointestinal: No  abdominal pain, No  nausea, No  vomiting,  No  blood in stool, No  diarrhea, No  constipation  Musculoskeletal: No new myalgia/arthralgia Skin: No  Rash, No other  wounds/concerning lesions Genitourinary: No  incontinence, No  abnormal genital bleeding, No abnormal genital discharge Hem/Onc: No  easy bruising/bleeding, No  abnormal lymph node Endocrine: No cold intolerance,  No heat intolerance. No polyuria/polydipsia/polyphagia  Neurologic: No  weakness, No  dizziness, No  slurred speech/focal weakness/facial droop Psychiatric: No  concerns with depression, No  concerns with anxiety, No sleep problems, No mood problems  Exam:  BP 133/80 (BP Location: Left Arm, Patient Position: Sitting, Cuff Size: Large)   Pulse 61   Resp 20   Ht '5\' 6"'$  (1.676 m)   Wt 276 lb (125.2 kg)   SpO2 98%   BMI 44.55 kg/m  Constitutional: VS see above. General Appearance: alert, well-developed, well-nourished, NAD Eyes: Normal lids and conjunctive, non-icteric sclera Ears, Nose, Mouth, Throat: MMM, Normal external inspection ears/nares/mouth/lips/gums. TM normal bilaterally.   Neck: No masses, trachea midline. No thyroid enlargement. No tenderness/mass appreciated. No lymphadenopathy Respiratory: Normal respiratory effort. no wheeze, no rhonchi, no rales Cardiovascular: S1/S2 normal, no murmur, no rub/gallop auscultated. RRR. +  lower extremity edema. Pedal pulse II/IV bilaterally PT. No carotid bruit or JVD. No abdominal aortic bruit. Gastrointestinal: Nontender, no masses. No hepatomegaly, no splenomegaly. No hernia appreciated. Bowel sounds normal. Rectal exam deferred.  Musculoskeletal: Gait normal. No clubbing/cyanosis of digits.  Neurological: Normal balance/coordination. No tremor. No cranial nerve deficit on limited exam. Motor and sensation intact and symmetric. Cerebellar reflexes intact.  Skin: warm, dry, intact. No rash/ulcer. No concerning nevi or subq nodules on limited exam.   Psychiatric: Normal judgment/insight. Normal mood and affect. Oriented x3.    ASSESSMENT/PLAN:   1. Annual physical exam Checking CBC, CMP, and Lipid panel.  - CBC - Lipid panel -  COMPLETE METABOLIC PANEL WITH GFR  2. Stress Continue Fluoxetine '20mg'$  daily. Refills sent to pharmacy.  - FLUoxetine (PROZAC) 20 MG capsule; TAKE 1 CAPSULE BY MOUTH DAILY.  Dispense: 90 capsule; Refill: 3  3. Morbid obesity (HCC) Checking Hemoglobin A1c. Discussed weight loss options. Starting Ozempic 0.'25mg'$  weekly.  - Hemoglobin A1c  4. Family history of diabetes mellitus Checking Hemoglobin A1c. - Hemoglobin A1c  5. Screening-pulmonary TB Checking Quantiferon gold for TB screening for school. - QuantiFERON-TB Gold Plus   Orders Placed This Encounter  Procedures   HM PAP SMEAR   CBC   Lipid panel   COMPLETE METABOLIC PANEL WITH GFR   Hemoglobin A1c   QuantiFERON-TB Gold Plus    Meds ordered this encounter  Medications   Semaglutide,0.25 or 0.'5MG'$ /DOS, (OZEMPIC, 0.25 OR 0.5 MG/DOSE,) 2 MG/1.5ML SOPN    Sig: Inject 0.25 mg into the skin once a week.    Dispense:  1.5 mL    Refill:  0    Sending in place of [place]. Please substitute Ozempic for Wegovy at the $25 per month cost.    Order Specific Question:   Supervising Provider    Answer:   Emeterio Reeve [id number]   FLUoxetine (PROZAC) 20 MG capsule    Sig: TAKE 1 CAPSULE BY MOUTH DAILY.    Dispense:  90 capsule    Refill:  3    Order Specific Question:   Supervising Provider    Answer:   Emeterio Reeve [id number]    There are no Patient Instructions on file for this visit.  Follow-up plan: Return in about 6 months (around 06/14/2021) for chronic disease follow up.  Clearnce Sorrel, DNP, APRN, FNP-BC [facility]

## 2020-12-15 LAB — QUANTIFERON-TB GOLD PLUS
Mitogen-NIL: >10 IU/mL
NIL: 0.02 IU/mL
QuantiFERON-TB Gold Plus: NEGATIVE
TB1-NIL: 0 IU/mL
TB2-NIL: 0 IU/mL

## 2020-12-15 LAB — CBC
HCT: 40.2 % (ref 35.0–45.0)
Hemoglobin: 12.7 g/dL (ref 11.7–15.5)
MCH: 25.1 pg — ABNORMAL LOW (ref 27.0–33.0)
MCHC: 31.6 g/dL — ABNORMAL LOW (ref 32.0–36.0)
MCV: 79.6 fL — ABNORMAL LOW (ref 80.0–100.0)
MPV: 11.1 fL (ref 7.5–12.5)
Platelets: 216 10*3/uL (ref 140–400)
RBC: 5.05 10*6/uL (ref 3.80–5.10)
RDW: 14.4 % (ref 11.0–15.0)
WBC: 4.5 10*3/uL (ref 3.8–10.8)

## 2020-12-15 LAB — COMPLETE METABOLIC PANEL WITH GFR
AG Ratio: 1.6 (calc) (ref 1.0–2.5)
ALT: 12 U/L (ref 6–29)
AST: 14 U/L (ref 10–35)
Albumin: 4 g/dL (ref 3.6–5.1)
Alkaline phosphatase (APISO): 52 U/L (ref 37–153)
BUN: 11 mg/dL (ref 7–25)
CO2: 30 mmol/L (ref 20–32)
Calcium: 9 mg/dL (ref 8.6–10.4)
Chloride: 107 mmol/L (ref 98–110)
Creat: 0.75 mg/dL (ref 0.50–1.03)
Globulin: 2.5 g/dL (calc) (ref 1.9–3.7)
Glucose, Bld: 90 mg/dL (ref 65–99)
Potassium: 3.8 mmol/L (ref 3.5–5.3)
Sodium: 144 mmol/L (ref 135–146)
Total Bilirubin: 0.2 mg/dL (ref 0.2–1.2)
Total Protein: 6.5 g/dL (ref 6.1–8.1)
eGFR: 95 mL/min/{1.73_m2} (ref >=60)

## 2020-12-15 LAB — HEMOGLOBIN A1C
Hgb A1c MFr Bld: 5.6 % of total Hgb (ref <5.7)
Mean Plasma Glucose: 114 mg/dL
eAG (mmol/L): 6.3 mmol/L

## 2020-12-15 LAB — IRON, TOTAL/TOTAL IRON BINDING CAP
%SAT: 22 % (calc) (ref 16–45)
Iron: 69 ug/dL (ref 45–160)
TIBC: 309 mcg/dL (calc) (ref 250–450)

## 2020-12-15 LAB — LIPID PANEL
Cholesterol: 169 mg/dL (ref <200)
HDL: 42 mg/dL — ABNORMAL LOW (ref >=50)
LDL Cholesterol (Calc): 108 mg/dL (calc) — ABNORMAL HIGH
Non-HDL Cholesterol (Calc): 127 mg/dL (calc) (ref <130)
Total CHOL/HDL Ratio: 4 (calc) (ref <5.0)
Triglycerides: 97 mg/dL (ref <150)

## 2021-01-25 ENCOUNTER — Other Ambulatory Visit: Payer: Self-pay | Admitting: Medical-Surgical

## 2021-01-25 ENCOUNTER — Other Ambulatory Visit (HOSPITAL_COMMUNITY): Payer: Self-pay

## 2021-01-25 MED FILL — Semaglutide Soln Pen-inj 0.25 or 0.5 MG/DOSE (2 MG/1.5ML): SUBCUTANEOUS | 56 days supply | Qty: 1.5 | Fill #0 | Status: AC

## 2021-02-06 DIAGNOSIS — Z7189 Other specified counseling: Secondary | ICD-10-CM | POA: Diagnosis not present

## 2021-02-06 DIAGNOSIS — Z23 Encounter for immunization: Secondary | ICD-10-CM | POA: Diagnosis not present

## 2021-02-21 ENCOUNTER — Other Ambulatory Visit: Payer: Self-pay

## 2021-02-21 ENCOUNTER — Encounter (HOSPITAL_COMMUNITY): Payer: Self-pay

## 2021-02-21 ENCOUNTER — Other Ambulatory Visit (HOSPITAL_COMMUNITY): Payer: Self-pay

## 2021-02-21 ENCOUNTER — Ambulatory Visit (HOSPITAL_COMMUNITY)
Admission: EM | Admit: 2021-02-21 | Discharge: 2021-02-21 | Disposition: A | Discharge status: Home / Self Care | Payer: No Typology Code available for payment source | Attending: Family Medicine | Admitting: Family Medicine

## 2021-02-21 DIAGNOSIS — R52 Pain, unspecified: Secondary | ICD-10-CM | POA: Insufficient documentation

## 2021-02-21 DIAGNOSIS — R110 Nausea: Secondary | ICD-10-CM | POA: Insufficient documentation

## 2021-02-21 DIAGNOSIS — Z880 Allergy status to penicillin: Secondary | ICD-10-CM | POA: Diagnosis not present

## 2021-02-21 DIAGNOSIS — Z20822 Contact with and (suspected) exposure to covid-19: Secondary | ICD-10-CM | POA: Diagnosis not present

## 2021-02-21 DIAGNOSIS — R5383 Other fatigue: Secondary | ICD-10-CM | POA: Insufficient documentation

## 2021-02-21 DIAGNOSIS — R197 Diarrhea, unspecified: Secondary | ICD-10-CM | POA: Diagnosis present

## 2021-02-21 LAB — CBC WITH DIFFERENTIAL/PLATELET
Abs Immature Granulocytes: 0.01 10*3/uL (ref 0.00–0.07)
Basophils Absolute: 0 10*3/uL (ref 0.0–0.1)
Basophils Relative: 1 %
Eosinophils Absolute: 0.3 10*3/uL (ref 0.0–0.5)
Eosinophils Relative: 5 %
HCT: 43.7 % (ref 36.0–46.0)
Hemoglobin: 13.4 g/dL (ref 12.0–15.0)
Immature Granulocytes: 0 %
Lymphocytes Relative: 22 %
Lymphs Abs: 1.1 10*3/uL (ref 0.7–4.0)
MCH: 25 pg — ABNORMAL LOW (ref 26.0–34.0)
MCHC: 30.7 g/dL (ref 30.0–36.0)
MCV: 81.7 fL (ref 80.0–100.0)
Monocytes Absolute: 0.4 10*3/uL (ref 0.1–1.0)
Monocytes Relative: 7 %
Neutro Abs: 3.3 10*3/uL (ref 1.7–7.7)
Neutrophils Relative %: 65 %
Platelets: 247 10*3/uL (ref 150–400)
RBC: 5.35 MIL/uL — ABNORMAL HIGH (ref 3.87–5.11)
RDW: 14.8 % (ref 11.5–15.5)
WBC: 5.1 10*3/uL (ref 4.0–10.5)
nRBC: 0 % (ref 0.0–0.2)

## 2021-02-21 LAB — LIPASE, BLOOD: Lipase: 41 U/L (ref 11–51)

## 2021-02-21 LAB — POCT URINALYSIS DIPSTICK, ED / UC
Bilirubin Urine: NEGATIVE
Glucose, UA: NEGATIVE mg/dL
Hgb urine dipstick: NEGATIVE
Ketones, ur: NEGATIVE mg/dL
Nitrite: NEGATIVE
Protein, ur: NEGATIVE mg/dL
Specific Gravity, Urine: 1.02 (ref 1.005–1.030)
Urobilinogen, UA: 2 mg/dL — ABNORMAL HIGH (ref 0.0–1.0)
pH: 8.5 — ABNORMAL HIGH (ref 5.0–8.0)

## 2021-02-21 LAB — COMPREHENSIVE METABOLIC PANEL
ALT: 15 U/L (ref 0–44)
AST: 17 U/L (ref 15–41)
Albumin: 3.8 g/dL (ref 3.5–5.0)
Alkaline Phosphatase: 51 U/L (ref 38–126)
Anion gap: 9 (ref 5–15)
BUN: 10 mg/dL (ref 6–20)
CO2: 30 mmol/L (ref 22–32)
Calcium: 9 mg/dL (ref 8.9–10.3)
Chloride: 104 mmol/L (ref 98–111)
Creatinine, Ser: 0.83 mg/dL (ref 0.44–1.00)
GFR, Estimated: >60 mL/min (ref >60)
Glucose, Bld: 86 mg/dL (ref 70–99)
Potassium: 4.1 mmol/L (ref 3.5–5.1)
Sodium: 143 mmol/L (ref 135–145)
Total Bilirubin: 0.4 mg/dL (ref 0.3–1.2)
Total Protein: 7.2 g/dL (ref 6.5–8.1)

## 2021-02-21 MED ORDER — ONDANSETRON 4 MG PO TBDP
4.0000 mg | ORAL_TABLET | Freq: Three times a day (TID) | ORAL | 0 refills | Status: DC | PRN
  Filled 2021-02-21: qty 20, 7d supply, fill #0

## 2021-02-21 NOTE — ED Provider Notes (Signed)
[place]    CSN: [id number] Arrival date & time: 02/21/21  1355      History   Chief Complaint Chief Complaint  Patient presents with   Diarrhea    HPI Cynthia [name] is a 55 y.o. female.   Patient presenting today with 3-day history of frequent loose stools, fatigue, body aches, weakness, rhinorrhea.  Started to also have some nausea earlier today but no vomiting.  Denies fevers, chills, dysuria, hematuria, urinary frequency, cough, chest pain, shortness of breath.  So far not trying anything over-the-counter for symptoms.  No known sick contacts, new foods, new medications, recent travel.  No chronic lower GI issues none.   Past Medical History:  Diagnosis Date   Anemia    as a child   Asthma    Chronic venous insufficiency 09/29/2017   Deep venous thrombosis of lower extremity ([place]) 11/17/2015   due to [facility] per pt   Depression    GERD (gastroesophageal reflux disease)    History of DVT (deep vein thrombosis)    History of kidney stones    Primary hyperparathyroidism ([place]) 11/28/2017   Sleep apnea    mild no mask   Sleep difficulties    Thyroid disease     Patient Active Problem List   Diagnosis Date Noted   Primary hyperparathyroidism ([place]) 04/28/2020   Right thyroid nodule 02/28/2020   Depressive disorder 03/18/2019   Venous insufficiency of both lower extremities 11/11/2018   Class 3 severe obesity due to excess calories with serious comorbidity and body mass index (BMI) of 45.0 to 49.9 in adult ([place]) 11/11/2018   Encounter for weight management 11/11/2018   Shortness of breath 10/23/2018   Chronic cough 10/23/2018   Bradycardia with 51-60 beats per minute 10/09/2018   Laryngospasms 10/09/2018   Dyspepsia 10/09/2018   Symptom of wheezing 10/09/2018   Persistent cough for 3 weeks or longer 10/09/2018   Bronchospasm, acute 05/27/2018   Hyperparathyroidism, primary ([place]) 11/28/2017   Microcytic anemia 11/13/2017   Low mean corpuscular  volume (MCV) 11/13/2017   Hypercalcemia 11/13/2017   Sacroiliac joint dysfunction of both sides 10/21/2017   Lumbar degenerative disc disease 10/21/2017   Abnormal weight gain 10/21/2017   Lymphedema 09/29/2017   Primary osteoarthritis of left knee 09/19/2017   Peripheral edema 09/19/2017   Morbid obesity with BMI of 40.0-44.9, adult ([place]) 09/18/2017   Urinary incontinence 09/18/2017   History of DVT (deep vein thrombosis)    Lesion of breast 06/18/2017   LGSIL on Pap smear of cervix 11/22/2015   Migraine 11/17/2015   Vitamin D deficiency 11/17/2015   Constipation-normal barium enema Sept 2013 02/06/2012   Lapband APL + HH repair (1 suture) 01/28/2012    Past Surgical History:  Procedure Laterality Date   CHOLECYSTECTOMY     COLONOSCOPY  2014   GASTRIC RESTRICTION SURGERY     lap band   HERNIA REPAIR     PARATHYROIDECTOMY Right 04/28/2020   Procedure: RIGHT  PARATHYROIDECTOMY;  Surgeon: Armandina Gemma, MD;  Location: [facility];  Service: General;  Laterality: Right;   THYROID LOBECTOMY Right 04/28/2020   Procedure: RIGHT THYROID LOBECTOMY;  Surgeon: Armandina Gemma, MD;  Location: [facility];  Service: General;  Laterality: Right;    OB History   No obstetric history on file.      Home Medications    Prior to Admission medications   Medication Sig Start Date End Date Taking? Authorizing Provider  ondansetron (ZOFRAN ODT) 4 MG disintegrating tablet Dissolve  1 tablet (4 mg total) by mouth every 8 (eight) hours as needed for nausea or vomiting. 02/21/21  Yes Volney American, PA-C  albuterol (VENTOLIN HFA) 108 (90 Base) MCG/ACT inhaler Inhale 2 puffs into the lungs every 4 (four) hours as needed for wheezing or shortness of breath (and cough x 3 days, then prn). Patient taking differently: Inhale 2 puffs into the lungs every 4 (four) hours as needed for wheezing or shortness of breath. 05/09/18   Rodriguez-Southworth, Sunday Spillers, PA-C  AMBULATORY NON FORMULARY MEDICATION Knee-high,  medium compression, graduated compression stockings. Apply to bilateral lower extremities daily 11/04/17   Trixie Dredge, PA-C  aspirin EC 81 MG tablet Take 81 mg by mouth daily.    [provider]  celecoxib (CELEBREX) 100 MG capsule TAKE 1 CAPSULE BY MOUTH 2 TIMES DAILY. 07/21/20 07/21/21  Samuel Bouche, NP  Cholecalciferol (VITAMIN D) 125 MCG (5000 UT) CAPS Take 5,000 Units by mouth every other day. 07/11/18   [provider]  Docusate Sodium (COLACE PO) Take 100 mg by mouth daily. 05/21/20   [provider]  Elastic Bandages & Supports (T.E.D. BELOW KNEE/X-LARGE) MISC Apply to lower extremities daily. Remove at bedtime 02/17/18   Trixie Dredge, PA-C  FLUoxetine (PROZAC) 20 MG capsule TAKE 1 CAPSULE BY MOUTH DAILY. 12/12/20 12/12/21  Samuel Bouche, NP  metoprolol tartrate (LOPRESSOR) 25 MG tablet TAKE 1 TABLET BY MOUTH 2 TIMES DAILY. Patient taking differently: Take 12.5 mg by mouth 2 (two) times daily. 07/21/20 07/21/21  Samuel Bouche, NP  NONFORMULARY OR COMPOUNDED ITEM Knee high medium compression stockings. Wear daily. Remove at bedtime 08/06/19   Early, Coralee Pesa, NP  Semaglutide,0.25 or 0.5MG /DOS, (OZEMPIC, 0.25 OR 0.5 MG/DOSE,) 2 MG/1.5ML SOPN Inject 0.25 mg into the skin once a week. 01/25/21   Samuel Bouche, NP  vitamin B-12 (CYANOCOBALAMIN) 1000 MCG tablet Take 1,000 mcg by mouth daily.    [provider]  zolpidem (AMBIEN) 5 MG tablet Take 1 tablet (5 mg total) by mouth at bedtime as needed. For sleep 12/07/19   Early, Coralee Pesa, NP    Family History Family History  Problem Relation Age of Onset   Stroke Mother    Hypertension Mother    Heart disease Father    Heart attack Father    Alcohol abuse Father    Prostate cancer Father    Uterine cancer Maternal Grandmother     Social History Social History   Tobacco Use   Smoking status: Never   Smokeless tobacco: Never  Vaping Use   Vaping Use: Never used  Substance Use Topics   Alcohol  use: No    Comment: social   Drug use: No     Allergies   Latex and Penicillin g   Review of Systems Review of Systems Per HPI  Physical Exam Triage Vital Signs ED Triage Vitals  Enc Vitals Group     BP 02/21/21 1526 (!) 168/88     Pulse Rate 02/21/21 1526 (!) 58     Resp 02/21/21 1526 18     Temp 02/21/21 1526 98.2 F (36.8 C)     Temp Source 02/21/21 1526 Oral     SpO2 02/21/21 1526 98 %     Weight --      Height --      Head Circumference --      Peak Flow --      Pain Score 02/21/21 1527 0     Pain Loc --  Pain Edu? --      Excl. in [place]? --    No data found.  Updated Vital Signs BP (!) 168/88 (BP Location: Left Arm)   Pulse (!) 58   Temp 98.2 F (36.8 C) (Oral)   Resp 18   LMP 04/24/2014   SpO2 98%   Visual Acuity Right Eye Distance:   Left Eye Distance:   Bilateral Distance:    Right Eye Near:   Left Eye Near:    Bilateral Near:     Physical Exam Vitals and nursing note reviewed.  Constitutional:      Appearance: Normal appearance. She is not ill-appearing.  HENT:     Head: Atraumatic.     Nose: Rhinorrhea present.     Mouth/Throat:     Mouth: Mucous membranes are moist.  Eyes:     Extraocular Movements: Extraocular movements intact.     Conjunctiva/sclera: Conjunctivae normal.  Cardiovascular:     Rate and Rhythm: Normal rate and regular rhythm.     Heart sounds: Normal heart sounds.  Pulmonary:     Effort: Pulmonary effort is normal.     Breath sounds: Normal breath sounds. No wheezing or rales.  Abdominal:     General: Bowel sounds are normal. There is no distension.     Palpations: Abdomen is soft.     Tenderness: There is abdominal tenderness. There is no right CVA tenderness, left CVA tenderness or guarding.     Comments: Very minimal tenderness to palpation right upper and lower abdomen without distention or guarding  Musculoskeletal:        General: Normal range of motion.     Cervical back: Normal range of motion and neck  supple.  Skin:    General: Skin is warm and dry.  Neurological:     Mental Status: She is alert and oriented to person, place, and time.  Psychiatric:        Mood and Affect: Mood normal.        Thought Content: Thought content normal.        Judgment: Judgment normal.     UC Treatments / Results  Labs (all labs ordered are listed, but only abnormal results are displayed) Labs Reviewed  SARS CORONAVIRUS 2 (TAT 6-24 HRS)  CBC WITH DIFFERENTIAL/PLATELET  COMPREHENSIVE METABOLIC PANEL  LIPASE, BLOOD  POCT URINALYSIS DIPSTICK, ED / UC   EKG  Radiology No results found.  Procedures Procedures (including critical care time)  Medications Ordered in UC Medications - No data to display  Initial Impression / Assessment and Plan / UC Course  I have reviewed the triage vital signs and the nursing notes.  Pertinent labs & imaging results that were available during my care of the patient were reviewed by me and considered in my medical decision making (see chart for details).     Suspect viral cause, will rule out COVID and obtain basic labs for safety check.  Discussed electrolyte repletion, brat diet, Zofran as needed for nausea.  Work note given.  Return for acutely worsening symptoms.  Final Clinical Impressions(s) / UC Diagnoses   Final diagnoses:  Diarrhea in adult patient  Nausea  Fatigue, unspecified type  Body aches   Discharge Instructions   None    ED Prescriptions     Medication Sig Dispense Auth. Provider   ondansetron (ZOFRAN ODT) 4 MG disintegrating tablet Dissolve 1 tablet (4 mg total) by mouth every 8 (eight) hours as needed for nausea or vomiting. 20 tablet  Volney American, [place]      PDMP not reviewed this encounter.   Volney American, [place] 02/21/21 1616

## 2021-02-21 NOTE — ED Triage Notes (Signed)
Pt c/o soft loose stools and fatigue since Sunday. States developed shakiness to both hands today. States diarrhea x4 today.

## 2021-02-22 ENCOUNTER — Encounter: Payer: Self-pay | Admitting: Medical-Surgical

## 2021-02-22 ENCOUNTER — Ambulatory Visit (INDEPENDENT_AMBULATORY_CARE_PROVIDER_SITE_OTHER): Payer: No Typology Code available for payment source | Admitting: Medical-Surgical

## 2021-02-22 VITALS — BP 119/73 | HR 55 | Resp 20 | Ht 66.0 in | Wt 276.0 lb

## 2021-02-22 DIAGNOSIS — B349 Viral infection, unspecified: Secondary | ICD-10-CM | POA: Diagnosis not present

## 2021-02-22 LAB — URINE CULTURE

## 2021-02-22 LAB — SARS CORONAVIRUS 2 (TAT 6-24 HRS): SARS Coronavirus 2: NEGATIVE

## 2021-02-22 NOTE — Progress Notes (Signed)
  HPI with pertinent ROS:   CC: Urgent care follow-up  HPI: Pleasant 55 year old female presenting today for evaluation after a visit to urgent care yesterday for diarrhea, nausea, excessive fatigue, and body aches.  On evaluation in urgent care, there were no red flags or emergent symptoms and it was suspected that she had a viral illness.  She was sent home with a prescription for Zofran for nausea and instructions for supportive care. Today, she reports that her stools are starting to firm up but she is still having intermittent nausea. Woke up in the middle of the night with significant nausea. She never picked up the Zofran from the pharmacy so she drank a little ginger ale which helped. Her biggest complaint today is overwhelming fatigue. She is having difficulty with eating and drinking due to her poor appetite and nausea so has just been sipping fluids periodically. No fevers, chills, chest pain, palpitations, shortness of breath, dizziness, and syncope.   I reviewed the past medical history, family history, social history, surgical history, and allergies today and no changes were needed.  Please see the problem list section below in epic for further details.   Physical exam:   General: Well Developed, well nourished, and in no acute distress.  Neuro: Alert and oriented x3.  HEENT: Normocephalic, atraumatic.  Skin: Warm and dry. Cardiac: Regular rate and rhythm, no murmurs rubs or gallops, no lower extremity edema.  Respiratory: Clear to auscultation bilaterally. Not using accessory muscles, speaking in full sentences.  Impression and Recommendations:    1. Viral illness Unfortunately, her symptoms do present consistent with a viral illness.  As such, this will just have to run its course.  Discussed symptomatic treatment at home with frequent small meals and snacks as well as fluid replacement with water, sports drinks, or even Pedialyte.  Reviewed emergency symptoms to seek urgent  care or emergency room evaluation for.  Work note provided to allow for several more days of rest and return to work on Monday.  Encourage patient to get Zofran from the pharmacy to help with her nausea.  Can also look into over-the-counter options such as Emetrol if desired.  Advised patient to let me know if the Zofran is not helpful and I will be glad to send in a low-dose Phenergan for her.  Return if symptoms worsen or fail to improve. ___________________________________________ Clearnce Sorrel, DNP, APRN, FNP-BC [facility]

## 2021-03-29 ENCOUNTER — Encounter: Payer: Self-pay | Admitting: Internal Medicine

## 2021-03-29 ENCOUNTER — Ambulatory Visit (INDEPENDENT_AMBULATORY_CARE_PROVIDER_SITE_OTHER): Payer: No Typology Code available for payment source | Admitting: Internal Medicine

## 2021-03-29 ENCOUNTER — Other Ambulatory Visit: Payer: Self-pay

## 2021-03-29 VITALS — BP 130/82 | HR 57 | Ht 66.0 in | Wt 276.4 lb

## 2021-03-29 DIAGNOSIS — E890 Postprocedural hypothyroidism: Secondary | ICD-10-CM | POA: Diagnosis not present

## 2021-03-29 DIAGNOSIS — E559 Vitamin D deficiency, unspecified: Secondary | ICD-10-CM | POA: Diagnosis not present

## 2021-03-29 DIAGNOSIS — E213 Hyperparathyroidism, unspecified: Secondary | ICD-10-CM

## 2021-03-29 LAB — TSH: TSH: 3.89 u[IU]/mL (ref 0.35–5.50)

## 2021-03-29 LAB — T4, FREE: Free T4: 0.89 ng/dL (ref 0.60–1.60)

## 2021-03-29 LAB — T3, FREE: T3, Free: 3.2 pg/mL (ref 2.3–4.2)

## 2021-03-29 LAB — VITAMIN D 25 HYDROXY (VIT D DEFICIENCY, FRACTURES): VITD: 40.93 ng/mL (ref 30.00–100.00)

## 2021-03-29 NOTE — Patient Instructions (Signed)
Please stop at the lab.  Continue vitamin D at the same dose and also vitamin K2.  Continue Tums as needed.  Please come back for a follow-up appointment in 1 year.

## 2021-03-29 NOTE — Progress Notes (Signed)
Patient ID: Cynthia [name], female   DOB: 06/27/1965, 55 y.o.   MRN: [id number]   This visit occurred during the SARS-CoV-2 public health emergency.  Safety protocols were in place, including screening questions prior to the visit, additional usage of staff PPE, and extensive cleaning of exam room while observing appropriate contact time as indicated for disinfecting solutions.   HPI  Cynthia [name] is a 55 y.o.-year-old female returning for follow-up for hypercalcemia/hyperparathyroidism and vitamin D deficiency.  Last visit 6 months ago.  Interim history: Patient had right parathyroidectomy and right thyroid lobectomy for a thyroid nodule in 04/2020 by Dr. Harlow Asa.  She started to feel much better after surgery. She had numbness and tingling in the hands and face and also occasional muscle cramps, but these have now all resolved. She continues to take Tums as needed. At last visit, she describes hair loss >> resolved. She has occasional constipation - much improved than before. She recently had the flu. Has pressure in neck afterwards. No choking/dysphagia.  Reviewed history: She was diagnosed with hypercalcemia in 2012 and she was found to have a very high PTH in 11/2017.  This was confirmed in 10/2018.  It was difficult to improve her vitamin D level to further investigate and treat her primary hyperparathyroidism, but this was finally confirmed and she had right inferior parathyroidectomy on 04/28/2020.  See investigation below.  During imaging investigation for her hyperparathyroidism she was found to have a right thyroid nodule -right hemithyroidectomy was performed and the final pathology was benign.  Reviewed pertinent results: Lab Results  Component Value Date   PTH 36 06/24/2020   PTH 116 (H) 09/03/2019   PTH Comment 09/03/2019   PTH 207 (H) 10/28/2018   PTH 316 (H) 11/27/2017   CALCIUM 9.0 02/21/2021   CALCIUM 9.0 12/12/2020   CALCIUM 9.5 09/26/2020   CALCIUM 9.4  04/29/2020   CALCIUM 10.8 (H) 04/18/2020   CALCIUM 11.6 (H) 02/24/2020   CALCIUM 11.6 (H) 02/09/2020   CALCIUM 11.5 (H) 09/03/2019   CALCIUM 11.8 (H) 08/11/2019   CALCIUM 11.1 (H) 10/28/2018   CALCIUM 11.1 (H) 10/28/2018   Component     Latest Ref Rng & Units 06/24/2020  Calcium Ionized     4.5 - 5.6 mg/dL 4.7  Postop: Calcium 8.7, PTH 33  Her vitamin D level finally normalized in 08/2019 so we could further investigate her hyperparathyroidism.  Thyroid ultrasound (09/09/2019): Parenchymal Echotexture: Mildly heterogenous Isthmus: 0.5 cm Right lobe: 6.0 x 3.0 x 3.1 cm Left lobe: 4.1 x 1.5 x 1.3 cm _________________________________________________________   Estimated total number of nodules >/= 1 cm: 1 Number of spongiform nodules >/=  2 cm not described below (TR1): 0 Number of mixed cystic and solid nodules >/= 1.5 cm not described below (TR2): 0 _________________________________________________________   Nodule # 1: Location: Right; Mid Maximum size: 4.6 cm; Other 2 dimensions: 3.2 x 2.4 cm Composition: solid/almost completely solid (2) Echogenicity: isoechoic (1) Echogenic foci: peripheral calcifications (2) and macrocalcifications (1 ACR TI-RADS total points: 6. ACR TI-RADS risk category: TR4 (4-6 points). ACR TI-RADS recommendations: **Given size (>/= 1.5 cm) and appearance, fine needle aspiration of this moderately suspicious nodule should be considered based on TI-RADS criteria.  _______________________________________________________   IMPRESSION: Heterogeneous and slightly ill-defined 4.6 cm TI-RADS category 4 nodule occupying the majority of the right mid gland meets criteria to consider fine-needle aspiration biopsy. Biopsy is recommended.  Thyroid Bx (09/24/2019): Benign:  Clinical History: Right Mid 4.6cm;  Other 2 dimensions:  3.2 x 2.4 cm  solid/almost completely solid isoechoic TI-RADS - 6  Specimen Submitted:  A. THYROID, RMP, FINE NEEDLE ASPIRATION:    FINAL MICROSCOPIC DIAGNOSIS:  - Consistent with benign follicular nodule ([place] category II)   SPECIMEN ADEQUACY:  Satisfactory for evaluation   A 24-hour urine calcium level was high: Component     Latest Ref Rng & Units 09/16/2019  Creatinine, 24H Ur     0.50 - 2.15 g/24 h 1.23  Calcium, 24H Urine     mg/24 h 400 (H)   Her calcitriol level was high, while magnesium and phosphorus levels were normal: Component     Latest Ref Rng & Units 09/03/2019          Vitamin D 1, 25 (OH) Total     18 - 72 pg/mL 115 (H)  Vitamin D3 1, 25 (OH)     pg/mL 115  Vitamin D2 1, 25 (OH)     pg/mL <8  Magnesium     1.5 - 2.5 mg/dL 2.1  Phosphorus     2.3 - 4.6 mg/dL 3.0   Technetium sestamibi scan (02/03/2020): Right inferior parathyroid adenoma  Right inferior parathyroidectomy and right thyroid lobectomy (04/28/2020): FINAL MICROSCOPIC DIAGNOSIS:  A. THYROID, RIGHT LOBE AND ISTHMUS, THYROIDECTOMY:  - Hypercellular parathyroid with fibrosis and calcification consistent  with adenoma (27 gm).  - Benign thyroid.   The surgery was initially planned for 01/2020 but on 02/10/2020 she triggered a code stroke after she presented with left-sided paresthesia and weakness.  A CT scan of the head and also an MRI of the brain were negative. The next day after surgery, on 04/30/2020, she presented to the emergency room with hypocalcemia symptoms: Tingling and muscle cramps but labs could not be drawn immediately so she left.  She was started on Tums >> now Prn. Symptoms resolved since then. Previously on HCTZ, now on Lasix. She has a history of kidney stone in 2018.  Vitamin D deficiency:  Reviewed vitamin D levels: Lab Results  Component Value Date   VD25OH 41.2 09/26/2020   VD25OH 24.9 (L) 06/24/2020   VD25OH 46.0 09/03/2019   VD25OH 22.85 (L) 04/30/2019   VD25OH 23.14 (L) 03/11/2019   VD25OH 21 (L) 10/28/2018   VD25OH 17.47 (L) 02/26/2018   We had significant difficulty in the past  normalizing her vitamin D.  She is currently on 5000 alternating with 10,000 units every other day cholecalciferol along with vitamin K2.  No FH of hypercalcemia, pituitary tumors, ?thyroid cancer, MEN sd's, or osteoporosis.  Both her parents had kidney stones.  No fractures, falls, or history of osteoporosis.  Pt. also has a history of LE lymphedema L>R.  She is going to the lymphedema clinic in [place]. Pt has a h/o lap band 2010 >> initially lost approximately 100 pounds, then gained 30 back after she got married.  She is considering a gastric sleeve done in the future.  She is now status post right hemithyroidectomy:    Initially, TSH was high, but it normalized at last check without intervention: Lab Results  Component Value Date   TSH 3.07 11/24/2020   TSH 5.64 (H) 09/26/2020   TSH 3.930 06/24/2020   TSH 1.90 02/26/2018   ROS: + see HPI Musculoskeletal: no muscle aches/resolved joint aches  I reviewed pt's medications, allergies, PMH, social hx, family hx, and changes were documented in the history of present illness. Otherwise, unchanged from my initial visit note.  Past Medical History:  Diagnosis Date  Anemia    as a child   Asthma    Chronic venous insufficiency 09/29/2017   Deep venous thrombosis of lower extremity ([place]) 11/17/2015   due to [facility] per pt   Depression    GERD (gastroesophageal reflux disease)    History of DVT (deep vein thrombosis)    History of kidney stones    Primary hyperparathyroidism ([place]) 11/28/2017   Sleep apnea    mild no mask   Sleep difficulties    Thyroid disease    Past Surgical History:  Procedure Laterality Date   CHOLECYSTECTOMY     COLONOSCOPY  2014   GASTRIC RESTRICTION SURGERY     lap band   HERNIA REPAIR     PARATHYROIDECTOMY Right 04/28/2020   Procedure: RIGHT  PARATHYROIDECTOMY;  Surgeon: Armandina Gemma, MD;  Location: [facility];  Service: General;  Laterality: Right;   THYROID LOBECTOMY Right 04/28/2020   Procedure: RIGHT  THYROID LOBECTOMY;  Surgeon: Armandina Gemma, MD;  Location: [facility];  Service: General;  Laterality: Right;   Social History   Socioeconomic History   Marital status: Married    Spouse name: Not on file   Number of children: 2   Years of education: Not on file   Highest education level: Not on file  Occupational History    Radiology tech  Tobacco Use   Smoking status: Never Smoker   Smokeless tobacco: Never Used  Substance and Sexual Activity   Alcohol use:  Yes, socially, 4-5 times yearly, liquor/wine   Drug use: No   Sexual activity: Yes    Birth control/protection: Post-menopausal   Current Outpatient Medications on File Prior to Visit  Medication Sig Dispense Refill   albuterol (VENTOLIN HFA) 108 (90 Base) MCG/ACT inhaler Inhale 2 puffs into the lungs every 4 (four) hours as needed for wheezing or shortness of breath (and cough x 3 days, then prn). (Patient taking differently: Inhale 2 puffs into the lungs every 4 (four) hours as needed for wheezing or shortness of breath.) 1 Inhaler 0   AMBULATORY NON FORMULARY MEDICATION Knee-high, medium compression, graduated compression stockings. Apply to bilateral lower extremities daily 1 each 0   aspirin EC 81 MG tablet Take 81 mg by mouth daily.     celecoxib (CELEBREX) 100 MG capsule TAKE 1 CAPSULE BY MOUTH 2 TIMES DAILY. 60 capsule 2   Cholecalciferol (VITAMIN D) 125 MCG (5000 UT) CAPS Take 5,000 Units by mouth every other day.     Docusate Sodium (COLACE PO) Take 100 mg by mouth daily.     Elastic Bandages & Supports (T.E.D. BELOW KNEE/X-LARGE) MISC Apply to lower extremities daily. Remove at bedtime 2 each 0   FLUoxetine (PROZAC) 20 MG capsule TAKE 1 CAPSULE BY MOUTH DAILY. 90 capsule 3   metoprolol tartrate (LOPRESSOR) 25 MG tablet TAKE 1 TABLET BY MOUTH 2 TIMES DAILY. (Patient taking differently: Take 12.5 mg by mouth 2 (two) times daily.) 60 tablet 1   NONFORMULARY OR COMPOUNDED ITEM Knee high medium compression stockings. Wear  daily. Remove at bedtime 2 each 0   ondansetron (ZOFRAN ODT) 4 MG disintegrating tablet Dissolve 1 tablet by mouth every 8 hours as needed for nausea or vomiting. 20 tablet 0   Semaglutide,0.25 or 0.5MG /DOS, (OZEMPIC, 0.25 OR 0.5 MG/DOSE,) 2 MG/1.5ML SOPN Inject 0.25 mg into the skin once a week. 1.5 mL 0   vitamin B-12 (CYANOCOBALAMIN) 1000 MCG tablet Take 1,000 mcg by mouth daily.     zolpidem (AMBIEN) 5 MG tablet Take  1 tablet (5 mg total) by mouth at bedtime as needed. For sleep 30 tablet 3   No current facility-administered medications on file prior to visit.   Allergies  Allergen Reactions   Latex Rash and Other (See Comments)    Where touched and will spread.   Penicillin G Itching   Family History  Problem Relation Age of Onset   Stroke Mother    Hypertension Mother    Heart disease Father    Heart attack Father    Alcohol abuse Father    Prostate cancer Father    Uterine cancer Maternal Grandmother     PE: BP 130/82 (BP Location: Right Arm, Patient Position: Sitting, Cuff Size: Normal)   Pulse (!) 57   Ht 5\' 6"  (1.676 m)   Wt 276 lb 6.4 oz (125.4 kg)   LMP 04/24/2014   SpO2 98%   BMI 44.61 kg/m  Wt Readings from Last 3 Encounters:  03/29/21 276 lb 6.4 oz (125.4 kg)  02/22/21 276 lb (125.2 kg)  12/12/20 276 lb (125.2 kg)   Constitutional: overweight, in NAD Eyes: PERRLA, EOMI, no exophthalmos ENT: moist mucous membranes, no thyromegaly, no cervical lymphadenopathy Cardiovascular: RRR, No MRG, + B LE edema Respiratory: CTA B Gastrointestinal: abdomen soft, NT, ND, BS+ Musculoskeletal: no deformities, strength intact in all 4 Skin: moist, warm, no rashes Neurological: no tremor with outstretched hands, DTR normal in all 4  Assessment: 1. Hypercalcemia/hyperparathyroidism  2.  Vitamin D deficiency  3.  Right thyroid fullness  [name]: Patient with history of significant hypercalcemia with the highest blood sugar of 11.8 and also very high parathyroid  hormone, at 316, for calcium of 11.6.  Her vitamin D level was very low in the past so we could not proceed right away with surgery, however, her vitamin D level normalized last year on supplementation with high doses of vitamin D so she finally had parathyroid surgery last year: Right parathyroidectomy and she also had right thyroid lobectomy at the same time on 04/28/2020.  Pathology showed hypercellular parathyroid with fibrosis and calcifications consistent with adenoma (27 g).  This nodule measures 3.5 x 3 x 2.7 cm.  Calcium dropped from 11.6 before surgery, to 8.7 after surgery.  PTH dropped from 207 before surgery to 33.  The next day after surgery, on 04/30/2020, she presented to the emergency room with hypocalcemia symptoms: Tingling and muscle cramps but labs could not be drawn immediately so she left.  She was started on daily Tums, which she was only using them 3 times a week at last visit.  Afterwards, symptoms improved, with only occasional tingling in her hands and in his mid face and also some muscle cramps but milder compared to previously.  Her latest ionized calcium was normal so we are continuing her on prn Tums -Most recent calcium level was reviewed from 02/2021 and this was normal, at 9.0.  We will not repeat this today. -She continues to feel much better after surgery: Her generalized aches and pains, constipation, and mental fog improved -At today's visit, she does not have numbness and tingling or acral cramping -We will continue on vitamin D supplementation (see below) and calcium (Tums) as needed - rarely now - I will see the patient back in 1 year  2.  Vitamin D deficiency -At last visit, the level was normal, at 41.2.  At that time, we did vitamin K2. -She continues on vitamin D 10,000 units alternating with 5000 units every other day -We will  recheck the level today  3.  Status post right hemithyroidectomy for right thyroid nodule -She had right hemithyroidectomy at the  time of her parathyroidectomy and the final pathology was benign -Latest TSH was normal in 11/2020 -We will repeat the labs today  Component     Latest Ref Rng & Units 03/29/2021  VITD     30.00 - 100.00 ng/mL 40.93  TSH     0.35 - 5.50 uIU/mL 3.89  T4,Free(Direct)     0.60 - 1.60 ng/dL 0.89  Triiodothyronine,Free,Serum     2.3 - 4.2 pg/mL 3.2  Labs are all normal.  Philemon Kingdom, MD PhD [facility] Endocrinology

## 2021-05-23 ENCOUNTER — Other Ambulatory Visit (HOSPITAL_COMMUNITY): Payer: Self-pay

## 2021-06-10 MED FILL — Celecoxib Cap 100 MG: ORAL | 30 days supply | Qty: 60 | Fill #1 | Status: AC

## 2021-06-12 ENCOUNTER — Other Ambulatory Visit (HOSPITAL_COMMUNITY): Payer: Self-pay

## 2021-06-14 ENCOUNTER — Ambulatory Visit: Payer: No Typology Code available for payment source | Admitting: Medical-Surgical

## 2021-06-20 ENCOUNTER — Other Ambulatory Visit (HOSPITAL_COMMUNITY): Payer: Self-pay

## 2021-06-20 ENCOUNTER — Other Ambulatory Visit: Payer: Self-pay | Admitting: Medical-Surgical

## 2021-06-20 MED ORDER — OZEMPIC (0.25 OR 0.5 MG/DOSE) 2 MG/1.5ML ~~LOC~~ SOPN
0.2500 mg | PEN_INJECTOR | SUBCUTANEOUS | 0 refills | Status: DC
  Filled 2021-06-20 – 2021-07-10 (×2): qty 1.5, 56d supply, fill #0

## 2021-06-28 ENCOUNTER — Other Ambulatory Visit (HOSPITAL_COMMUNITY): Payer: Self-pay

## 2021-07-10 ENCOUNTER — Other Ambulatory Visit (HOSPITAL_COMMUNITY): Payer: Self-pay

## 2021-08-01 ENCOUNTER — Ambulatory Visit (INDEPENDENT_AMBULATORY_CARE_PROVIDER_SITE_OTHER): Payer: No Typology Code available for payment source | Admitting: Medical-Surgical

## 2021-08-01 ENCOUNTER — Other Ambulatory Visit: Payer: Self-pay

## 2021-08-01 ENCOUNTER — Other Ambulatory Visit (HOSPITAL_COMMUNITY): Payer: Self-pay

## 2021-08-01 ENCOUNTER — Encounter: Payer: Self-pay | Admitting: Medical-Surgical

## 2021-08-01 VITALS — BP 108/71 | HR 71 | Resp 16 | Ht 66.0 in | Wt 293.0 lb

## 2021-08-01 DIAGNOSIS — Z23 Encounter for immunization: Secondary | ICD-10-CM

## 2021-08-01 DIAGNOSIS — R609 Edema, unspecified: Secondary | ICD-10-CM

## 2021-08-01 DIAGNOSIS — F32A Depression, unspecified: Secondary | ICD-10-CM | POA: Diagnosis not present

## 2021-08-01 DIAGNOSIS — R350 Frequency of micturition: Secondary | ICD-10-CM | POA: Diagnosis not present

## 2021-08-01 DIAGNOSIS — M545 Low back pain, unspecified: Secondary | ICD-10-CM

## 2021-08-01 DIAGNOSIS — Z6841 Body Mass Index (BMI) 40.0 and over, adult: Secondary | ICD-10-CM

## 2021-08-01 LAB — POCT URINALYSIS DIP (CLINITEK)
Bilirubin, UA: NEGATIVE
Blood, UA: NEGATIVE
Glucose, UA: NEGATIVE mg/dL
Nitrite, UA: NEGATIVE
POC PROTEIN,UA: NEGATIVE
Spec Grav, UA: 1.025 (ref 1.010–1.025)
Urobilinogen, UA: 2 E.U./dL — AB
pH, UA: 6 (ref 5.0–8.0)

## 2021-08-01 MED ORDER — DULOXETINE HCL 30 MG PO CPEP
30.0000 mg | ORAL_CAPSULE | Freq: Every day | ORAL | 0 refills | Status: DC
  Filled 2021-08-01: qty 14, 14d supply, fill #0

## 2021-08-01 MED ORDER — SAXENDA 18 MG/3ML ~~LOC~~ SOPN: PEN_INJECTOR | SUBCUTANEOUS | 0 refills | Status: DC

## 2021-08-01 MED ORDER — DULOXETINE HCL 60 MG PO CPEP
60.0000 mg | ORAL_CAPSULE | Freq: Every day | ORAL | 3 refills | Status: DC
  Filled 2021-08-01: qty 90, 90d supply, fill #0
  Filled 2021-11-02: qty 90, 90d supply, fill #1
  Filled 2022-03-25: qty 90, 90d supply, fill #2

## 2021-08-01 NOTE — Patient Instructions (Signed)
Stop Fluoxetine.  ? ?Start Cymbalta at '30mg'$  daily for 2 weeks then increase to '60mg'$  daily.  ?

## 2021-08-01 NOTE — Progress Notes (Signed)
?  HPI with pertinent ROS:  ? ?CC: back pain, depression, fluid retention, weight loss ? ?HPI: ?Pleasant 56 year old female presenting today for the following: ? ?Back pain- along the right side at the lower thoracic/low back area for the past 3 weeks. Pain is sharp, does not radiate. Happens when getting up from a seated position, usually after sitting a while. Does not happen every time she gets up from sitting. No known injury. Has noted increased urinary frequency but no other UTI symptoms over the last 3 weeks as well. ? ?Edema- previous history of edema in the BLE but mostly affecting the left leg and ankle. Has compression socks/stockings as well as a mechanical compression device that is very helpful. Swelling has been worse over the last week. Following a low sodium diet.  ? ?Depression- taking Prozac '20mg'$  daily, tolerating well without side effects. Has never taken other medications and Prozac has worked well for her until she started school. Now she feels like it is not helping her stress levels.  ? ?Weight concerns- unable to tolerate Ozempic due to significant side effects. Is interested in other options for weight loss.   ? ?I reviewed the past medical history, family history, social history, surgical history, and allergies today and no changes were needed.  Please see the problem list section below in epic for further details. ? ?Physical exam:  ? ?General: Well Developed, well nourished, and in no acute distress.  ?Neuro: Alert and oriented x3.  ?HEENT: Normocephalic, atraumatic.  ?Skin: Warm and dry. ?Cardiac: Regular rate and rhythm, no murmurs rubs or gallops, no lower extremity edema.  ?Respiratory: Clear to auscultation bilaterally. Not using accessory muscles, speaking in full sentences. ? ?Impression and Recommendations:   ? ?1. Urinary frequency ?POCT UA  + small leukocytes, negative nitrites and blood. Sending for culture.  ?- POCT URINALYSIS DIP (CLINITEK) ?- Urine Culture ? ?2. Acute  right-sided low back pain without sciatica ?Suspect muscle spasm from poor position and/or posture when seated for long periods of time. Exam unremarkable. Discussed conservative treatment with heat, ice, Tylenol, Ibuprofen, massage, etc.  ? ?3. Class 3 severe obesity due to excess calories with serious comorbidity and body mass index (BMI) of 45.0 to 49.9 in adult [facility]) ?Discussed various options for weight loss. Ultimately, she needs to start working on lifestyle changes to incorporate regular intentional exercise and a low calorie heart healthy diet. After discussion of options, she would like to try Saxenda. Prescription sent with taper instructions.  ? ?4. Depressive disorder ?Discussed increasing fluoxetine vs switching to a different agent. Feel that an SNRI would help with sleep, mood, and generalized aches and pains. Patient agreeable to trial a different medication so sending in Cymbalta '30mg'$  daily for 14 days then increase to '60mg'$  daily.  ? ?5. Peripheral edema ?Discussed continued sodium restriction and use of compression socks. Recommend restarting regular use of her compression device since this showed great promise. Exercise and regular activity will help with fluid retention.  ? ?6. Immunization due ?Shingrix #2 given in office today. Next will be due in 2-6 months. Ok to schedule this as a Marine scientist visit.  ?- Varicella-zoster vaccine IM (Shingrix) ? ?Return in about 6 weeks (around 09/12/2021) for mood follow up. ?___________________________________________ ?Clearnce Sorrel, DNP, APRN, FNP-BC ?Primary Care and Sports Medicine ?[place] ?

## 2021-08-03 ENCOUNTER — Encounter: Payer: Self-pay | Admitting: Medical-Surgical

## 2021-08-03 LAB — URINE CULTURE
MICRO NUMBER:: 13131756
SPECIMEN QUALITY:: ADEQUATE

## 2021-08-17 ENCOUNTER — Encounter: Payer: No Typology Code available for payment source | Admitting: Medical-Surgical

## 2021-09-15 ENCOUNTER — Other Ambulatory Visit: Payer: Self-pay | Admitting: Medical-Surgical

## 2021-09-15 ENCOUNTER — Other Ambulatory Visit (HOSPITAL_COMMUNITY): Payer: Self-pay

## 2021-09-15 MED ORDER — UNIFINE PENTIPS 31G X 6 MM MISC
0 refills | Status: DC
  Filled 2021-10-12: qty 100, 90d supply, fill #0

## 2021-09-15 MED ORDER — SAXENDA 18 MG/3ML ~~LOC~~ SOPN
PEN_INJECTOR | SUBCUTANEOUS | 0 refills | Status: DC
  Filled 2021-09-15: qty 15, 30d supply, fill #0
  Filled 2021-12-03: qty 15, 30d supply, fill #1

## 2021-09-16 ENCOUNTER — Other Ambulatory Visit (HOSPITAL_COMMUNITY): Payer: Self-pay

## 2021-09-18 ENCOUNTER — Ambulatory Visit (INDEPENDENT_AMBULATORY_CARE_PROVIDER_SITE_OTHER): Payer: Self-pay | Admitting: Medical-Surgical

## 2021-09-18 DIAGNOSIS — Z91199 Patient's noncompliance with other medical treatment and regimen due to unspecified reason: Secondary | ICD-10-CM

## 2021-09-18 NOTE — Progress Notes (Signed)
   Complete physical exam  Patient: Cynthia [name]   DOB: 03/10/1998   56 y.o. Female  MRN: [id number]  Subjective:    No chief complaint on file.   Cynthia [name] is a 56 y.o. female who presents today for a complete physical exam. She reports consuming a {diet types:17450} diet. {types:19826} She generally feels {DESC; WELL/FAIRLY WELL/POORLY:18703}. She reports sleeping {DESC; WELL/FAIRLY WELL/POORLY:18703}. She {does/does not:200015} have additional problems to discuss today.    Most recent fall risk assessment:    11/15/2021   10:42 AM  Fall Risk   Falls in the past year? 0  Number falls in past yr: 0  Injury with Fall? 0  Risk for fall due to : No Fall Risks  Follow up Falls evaluation completed     Most recent depression screenings:    11/15/2021   10:42 AM 10/06/2020   10:46 AM  PHQ 2/9 Scores  PHQ - 2 Score 0 0  PHQ- 9 Score 5     {VISON DENTAL STD PSA (Optional):27386}  {History (Optional):23778}  Patient Care Team: Pastor Sgro, NP as PCP - General (Nurse Practitioner)   Outpatient Medications Prior to Visit  Medication Sig   fluticasone (FLONASE) 50 MCG/ACT nasal spray Place 2 sprays into both nostrils in the morning and at bedtime. After 7 days, reduce to once daily.   norgestimate-ethinyl estradiol (SPRINTEC 28) 0.25-35 MG-MCG tablet Take 1 tablet by mouth daily.   Nystatin POWD Apply liberally to affected area 2 times per day   spironolactone (ALDACTONE) 100 MG tablet Take 1 tablet (100 mg total) by mouth daily.   No facility-administered medications prior to visit.    ROS        Objective:     There were no vitals taken for this visit. {Vitals History (Optional):23777}  Physical Exam   No results found for any visits on 12/21/21. {Show previous labs (optional):23779}    Assessment & Plan:    Routine Health Maintenance and Physical Exam  Immunization History  Administered Date(s) Administered   DTaP 05/24/1999, 07/20/1999,  09/28/1999, 06/13/2000, 12/28/2003   Hepatitis A 10/24/2007, 10/29/2008   Hepatitis B 03/11/1999, 04/18/1999, 09/28/1999   HiB (PRP-OMP) 05/24/1999, 07/20/1999, 09/28/1999, 06/13/2000   IPV 05/24/1999, 07/20/1999, 03/18/2000, 12/28/2003   Influenza,inj,Quad PF,6+ Mos 01/29/2014   Influenza-Unspecified 04/30/2012   MMR 03/18/2001, 12/28/2003   Meningococcal Polysaccharide 10/29/2011   Pneumococcal Conjugate-13 06/13/2000   Pneumococcal-Unspecified 09/28/1999, 12/12/1999   Tdap 10/29/2011   Varicella 03/18/2000, 10/24/2007    Health Maintenance  Topic Date Due   HIV Screening  Never done   Hepatitis C Screening  Never done   INFLUENZA VACCINE  12/19/2021   PAP-Cervical Cytology Screening  12/21/2021 (Originally 03/09/2020)   PAP SMEAR-Modifier  12/21/2021 (Originally 03/09/2020)   TETANUS/TDAP  12/21/2021 (Originally 10/28/2021)   HPV VACCINES  Discontinued   COVID-19 Vaccine  Discontinued    Discussed health benefits of physical activity, and encouraged her to engage in regular exercise appropriate for her age and condition.  Problem List Items Addressed This Visit   None Visit Diagnoses     Annual physical exam    -  Primary   Cervical cancer screening       Need for Tdap vaccination          No follow-ups on file.     Josette Shimabukuro, NP   

## 2021-09-20 ENCOUNTER — Telehealth: Payer: Self-pay

## 2021-09-20 NOTE — Telephone Encounter (Addendum)
Initiated Prior authorization [id number] '18MG'$ /3ML pen-injectors ?Via: Covermymeds ?Case/Key: [id number] ?Status: approved  as of 09/20/21 ?Reason:The authorization is effective for a maximum of 4 fills from 09/21/2021 to 01/19/2022, ?Notified Pt via: Mychart  ?

## 2021-09-23 ENCOUNTER — Other Ambulatory Visit (HOSPITAL_COMMUNITY): Payer: Self-pay

## 2021-10-09 ENCOUNTER — Encounter: Payer: Self-pay | Admitting: Medical-Surgical

## 2021-10-12 ENCOUNTER — Other Ambulatory Visit (HOSPITAL_COMMUNITY): Payer: Self-pay

## 2021-10-13 ENCOUNTER — Encounter: Payer: Self-pay | Admitting: Neurology

## 2021-10-13 NOTE — Telephone Encounter (Signed)
Mychart message sent to patient.

## 2021-11-02 ENCOUNTER — Other Ambulatory Visit (HOSPITAL_COMMUNITY): Payer: Self-pay

## 2021-11-07 ENCOUNTER — Other Ambulatory Visit (HOSPITAL_COMMUNITY): Payer: Self-pay

## 2021-11-07 ENCOUNTER — Emergency Department (INDEPENDENT_AMBULATORY_CARE_PROVIDER_SITE_OTHER): Payer: No Typology Code available for payment source

## 2021-11-07 ENCOUNTER — Emergency Department (INDEPENDENT_AMBULATORY_CARE_PROVIDER_SITE_OTHER)
Admission: RE | Admit: 2021-11-07 | Discharge: 2021-11-07 | Disposition: A | Discharge status: Home / Self Care | Payer: No Typology Code available for payment source | Source: Ambulatory Visit | Attending: Family Medicine | Admitting: Family Medicine

## 2021-11-07 VITALS — BP 124/79 | HR 73 | Temp 99.1°F | Resp 18 | Ht 66.0 in | Wt 280.0 lb

## 2021-11-07 DIAGNOSIS — R059 Cough, unspecified: Secondary | ICD-10-CM

## 2021-11-07 DIAGNOSIS — J069 Acute upper respiratory infection, unspecified: Secondary | ICD-10-CM

## 2021-11-07 DIAGNOSIS — G4730 Sleep apnea, unspecified: Secondary | ICD-10-CM

## 2021-11-07 MED ORDER — AZITHROMYCIN 250 MG PO TABS: 250.0000 mg | ORAL_TABLET | Freq: Every day | ORAL | 0 refills | Status: DC

## 2021-11-07 MED ORDER — BENZONATATE 200 MG PO CAPS
200.0000 mg | ORAL_CAPSULE | Freq: Three times a day (TID) | ORAL | 0 refills | Status: DC | PRN
  Filled 2021-11-07: qty 21, 7d supply, fill #0

## 2021-11-07 NOTE — ED Provider Notes (Signed)
[facility]    CSN: [id number] Arrival date & time: 11/07/21  1011      History   Chief Complaint Chief Complaint  Patient presents with   Nasal Congestion    Nasal Congestion, Extreme cough, symptoms of flu or pneumonia.  Symptoms are much worse than a cold.  I was told that I stopped breathing during my sleep last night (11/06/21) & once this morning.  No fever, but chills are constant - Entered by patient   Appointment    HPI Cynthia [name] is a 56 y.o. female.   HPI See the patient's chief complaint documented above Patient has sinus congestion, postnasal drip, hoarse voice, and sore throat.  She has also had some coughing.  She has body aches and fatigue but has not really had much fever.  She is taking Robitussin with out a lot of relief. She is here at the request of her husband.  She states 2 times last night while she was sleeping he witnessed that she stopped breathing.  Patient states that she was diagnosed with obstructive sleep apnea many years ago at the time of her Lap-Band surgery.  She has never been treated for this and has never used a CPAP.  Her husband is never mentioned that he has observed her with difficulty breathing at night.  Patient states the cough is making it difficult for her to sleep at night.  She is tired.  Past Medical History:  Diagnosis Date   Anemia    as a child   Asthma    Chronic venous insufficiency 09/29/2017   Deep venous thrombosis of lower extremity ([place]) 11/17/2015   due to [facility] per pt   Depression    GERD (gastroesophageal reflux disease)    History of DVT (deep vein thrombosis)    History of kidney stones    Primary hyperparathyroidism ([place]) 11/28/2017   Sleep apnea    mild no mask   Sleep difficulties    Thyroid disease     Patient Active Problem List   Diagnosis Date Noted   Primary hyperparathyroidism ([place]) 04/28/2020   Right thyroid nodule 02/28/2020   Depressive disorder 03/18/2019   Venous  insufficiency of both lower extremities 11/11/2018   Class 3 severe obesity due to excess calories with serious comorbidity and body mass index (BMI) of 45.0 to 49.9 in adult ([place]) 11/11/2018   Shortness of breath 10/23/2018   Chronic cough 10/23/2018   Bradycardia with 51-60 beats per minute 10/09/2018   Laryngospasms 10/09/2018   Dyspepsia 10/09/2018   Bronchospasm, acute 05/27/2018   Microcytic anemia 11/13/2017   Hypercalcemia 11/13/2017   Sacroiliac joint dysfunction of both sides 10/21/2017   Lumbar degenerative disc disease 10/21/2017   Primary osteoarthritis of left knee 09/19/2017   Peripheral edema 09/19/2017   Morbid obesity with BMI of 40.0-44.9, adult ([place]) 09/18/2017   Urinary incontinence 09/18/2017   History of DVT (deep vein thrombosis)    LGSIL on Pap smear of cervix 11/22/2015   Migraine 11/17/2015   Vitamin D deficiency 11/17/2015   Lapband APL + HH repair (1 suture) 01/28/2012    Past Surgical History:  Procedure Laterality Date   CHOLECYSTECTOMY     COLONOSCOPY  2014   GASTRIC RESTRICTION SURGERY     lap band   HERNIA REPAIR     PARATHYROIDECTOMY Right 04/28/2020   Procedure: RIGHT  PARATHYROIDECTOMY;  Surgeon: Armandina Gemma, MD;  Location: [facility];  Service: General;  Laterality: Right;   THYROID  LOBECTOMY Right 04/28/2020   Procedure: RIGHT THYROID LOBECTOMY;  Surgeon: Armandina Gemma, MD;  Location: [facility];  Service: General;  Laterality: Right;    OB History   No obstetric history on file.      Home Medications    Prior to Admission medications   Medication Sig Start Date End Date Taking? Authorizing Provider  albuterol (VENTOLIN HFA) 108 (90 Base) MCG/ACT inhaler Inhale 2 puffs into the lungs every 4 (four) hours as needed for wheezing or shortness of breath (and cough x 3 days, then prn). Patient taking differently: Inhale 2 puffs into the lungs every 4 (four) hours as needed for wheezing or shortness of breath. 05/09/18  Yes [place],  Sandrea Matte  aspirin EC 81 MG tablet Take 81 mg by mouth daily.   Yes [provider]  azithromycin (ZITHROMAX) 250 MG tablet Take 1 tablet (250 mg total) by mouth daily. Take first 2 tablets together, then 1 every day until finished. 11/07/21  Yes Raylene Everts, MD  benzonatate (TESSALON) 200 MG capsule Take 1 capsule (200 mg total) by mouth 3 (three) times daily as needed for cough. 11/07/21  Yes Raylene Everts, MD  Cholecalciferol (VITAMIN D) 125 MCG (5000 UT) CAPS Take 5,000 Units by mouth every other day. 07/11/18  Yes [provider]  Docusate Sodium (COLACE PO) Take 100 mg by mouth daily. 05/21/20  Yes [provider]  DULoxetine (CYMBALTA) 60 MG capsule Take 1 capsule  by mouth daily. 08/01/21  Yes Samuel Bouche, NP  Elastic Bandages & Supports (T.E.D. BELOW KNEE/X-LARGE) MISC Apply to lower extremities daily. Remove at bedtime 02/17/18  Yes Trixie Dredge, [place]  Insulin Pen Needle (UNIFINE PENTIPS) 31G X 6 MM MISC USE ONCE A DAY TO INJECT SAXENDA 09/15/21  Yes Samuel Bouche, NP  Liraglutide -Weight Management (SAXENDA) 18 MG/3ML SOPN Inject 0.6 mg into the skin daily for 1 week, then increase by 0.6 mg weekly until reaching 3 mg injected into the skin daily daily 09/15/21  Yes Samuel Bouche, NP  NONFORMULARY OR COMPOUNDED ITEM Knee high medium compression stockings. Wear daily. Remove at bedtime 08/06/19  Yes Early, Coralee Pesa, NP  vitamin B-12 (CYANOCOBALAMIN) 1000 MCG tablet Take 1,000 mcg by mouth daily.   Yes [provider]  zolpidem (AMBIEN) 5 MG tablet Take 1 tablet (5 mg total) by mouth at bedtime as needed. For sleep 12/07/19  Yes Early, Coralee Pesa, NP  AMBULATORY NON FORMULARY MEDICATION Knee-high, medium compression, graduated compression stockings. Apply to bilateral lower extremities daily 11/04/17   Trixie Dredge, PA-C  aspirin 81 MG chewable tablet Chew 81 mg by mouth daily. 02/03/21   [provider]  DULoxetine  (CYMBALTA) 30 MG capsule Take 1 capsule  by mouth daily. 08/01/21   Samuel Bouche, NP    Family History Family History  Problem Relation Age of Onset   Stroke Mother    Hypertension Mother    Heart disease Father    Heart attack Father    Alcohol abuse Father    Prostate cancer Father    Uterine cancer Maternal Grandmother     Social History Social History   Tobacco Use   Smoking status: Never   Smokeless tobacco: Never  Vaping Use   Vaping Use: Never used  Substance Use Topics   Alcohol use: No    Comment: social   Drug use: No     Allergies   Ozempic (0.25 or 0.5 mg-dose) [semaglutide(0.25 or 0.'5mg'$ -dos)], Latex, and  Penicillin g   Review of Systems Review of Systems See HPI  Physical Exam Triage Vital Signs ED Triage Vitals  Enc Vitals Group     BP 11/07/21 1107 124/79     Pulse Rate 11/07/21 1107 73     Resp 11/07/21 1107 18     Temp 11/07/21 1107 99.1 F (37.3 C)     Temp Source 11/07/21 1107 Oral     SpO2 11/07/21 1107 97 %     Weight 11/07/21 1108 280 lb (127 kg)     Height 11/07/21 1108 '5\' 6"'$  (1.676 m)     Head Circumference --      Peak Flow --      Pain Score 11/07/21 1107 6     Pain Loc --      Pain Edu? --      Excl. in [place]? --    No data found.  Updated Vital Signs BP 124/79 (BP Location: Right Arm)   Pulse 73   Temp 99.1 F (37.3 C) (Oral)   Resp 18   Ht '5\' 6"'$  (1.676 m)   Wt 127 kg   LMP 04/24/2014   SpO2 97%   BMI 45.19 kg/m       Physical Exam Constitutional:      General: She is not in acute distress.    Appearance: She is well-developed. She is obese. She is ill-appearing.  HENT:     Head: Normocephalic and atraumatic.     Right Ear: Tympanic membrane and ear canal normal.     Left Ear: Ear canal normal.     Nose: Congestion and rhinorrhea present.     Mouth/Throat:     Pharynx: Posterior oropharyngeal erythema present.     Comments: Erythema of posterior pharynx.  Yellow postnasal drip noted.  Hoarse voice Eyes:      Conjunctiva/sclera: Conjunctivae normal.     Pupils: Pupils are equal, round, and reactive to light.  Cardiovascular:     Rate and Rhythm: Normal rate and regular rhythm.     Heart sounds: Normal heart sounds.  Pulmonary:     Effort: Pulmonary effort is normal. No respiratory distress.     Breath sounds: Rhonchi present.  Abdominal:     General: There is no distension.     Palpations: Abdomen is soft.  Musculoskeletal:        General: Normal range of motion.     Cervical back: Normal range of motion.  Lymphadenopathy:     Cervical: No cervical adenopathy.  Skin:    General: Skin is warm and dry.  Neurological:     Mental Status: She is alert.  Psychiatric:        Mood and Affect: Mood normal.        Behavior: Behavior normal.      UC Treatments / Results  Labs (all labs ordered are listed, but only abnormal results are displayed) Labs Reviewed - No data to display  EKG   Radiology DG Chest 2 View  Result Date: 11/07/2021 CLINICAL DATA:  Cough EXAM: CHEST - 2 VIEW COMPARISON:  Chest x-ray 11/28/2018 FINDINGS: Heart size and mediastinal contours are within normal limits. No suspicious pulmonary opacities identified. No pleural effusion or pneumothorax visualized. No acute osseous abnormality appreciated. IMPRESSION: No acute intrathoracic process identified. Electronically Signed   By: Ofilia Neas M.D.   On: 11/07/2021 11:54    Procedures Procedures (including critical care time)  Medications Ordered in UC Medications - No data to display  Initial Impression / Assessment and Plan / UC Course  I have reviewed the triage vital signs and the nursing notes.  Pertinent labs & imaging results that were available during my care of the patient were reviewed by me and considered in my medical decision making (see chart for details).     We did discuss that I large majority of upper respiratory infections are caused by viruses.  She does have significantly uncomfortable  symptoms and acute cough.  We will treat with Tessalon, fluids, and rest.  If she fails to improve in a couple days she will take a Z-Pak.  Follow-up with primary care is encouraged Final Clinical Impressions(s) / UC Diagnoses   Final diagnoses:  Viral URI with cough  Sleep apnea, unspecified type     Discharge Instructions      Make sure you are drinking lots of fluid Its run humidifier in your bedroom if you have 1 available Take the Tessalon 2-3 times a day as needed for cough May continue over-the-counter cough or cold medicines as needed If not improving over the next 2 to 3 days, consider azithromycin See your doctor if you fail to improve Please mention the sleep apnea to Dr. Charna Archer at your next visit.  She may want to do a new sleep study   ED Prescriptions     Medication Sig Dispense Auth. Provider   benzonatate (TESSALON) 200 MG capsule Take 1 capsule (200 mg total) by mouth 3 (three) times daily as needed for cough. 21 capsule Raylene Everts, MD   azithromycin (ZITHROMAX) 250 MG tablet Take 1 tablet (250 mg total) by mouth daily. Take first 2 tablets together, then 1 every day until finished. 6 tablet Raylene Everts, MD      PDMP not reviewed this encounter.   Raylene Everts, MD 11/07/21 [phone]

## 2021-11-07 NOTE — Discharge Instructions (Signed)
Make sure you are drinking lots of fluid Its run humidifier in your bedroom if you have 1 available Take the Tessalon 2-3 times a day as needed for cough May continue over-the-counter cough or cold medicines as needed If not improving over the next 2 to 3 days, consider azithromycin See your doctor if you fail to improve Please mention the sleep apnea to Dr. Charna Archer at your next visit.  She may want to do a new sleep study

## 2021-11-07 NOTE — ED Triage Notes (Signed)
Patient c/o productive cough, nasal drainage, afebrile, body chills x 1 week.  Patient has taken Robitussin w/o much relief.

## 2021-11-10 ENCOUNTER — Other Ambulatory Visit (HOSPITAL_COMMUNITY): Payer: Self-pay

## 2021-11-10 MED ORDER — AZITHROMYCIN 250 MG PO TABS
ORAL_TABLET | ORAL | 0 refills | Status: DC
  Filled 2021-11-10: qty 6, 5d supply, fill #0

## 2021-11-23 NOTE — Progress Notes (Signed)
Complete physical exam  Patient: Cynthia [name]   DOB: [birth year]   56 y.o. Female  MRN: [id number]  Subjective:    Chief Complaint  Patient presents with   Annual Exam    Quynh Mordecai Maes is a 56 y.o. female who presents today for a complete physical exam. She reports consuming a general diet. Home exercise routine includes walking and stationary biking. She generally feels fairly well. She reports sleeping well. She does have additional problems to discuss today.   Most recent fall risk assessment:    11/24/2021   11:02 AM  Fall Risk   Falls in the past year? 0  Number falls in past yr: 0  Injury with Fall? 0  Risk for fall due to : No Fall Risks  Follow up Falls evaluation completed    Most recent depression screenings:    11/24/2021   11:02 AM 08/01/2021    3:19 PM  PHQ 2/9 Scores  PHQ - 2 Score 1 6  PHQ- 9 Score  21    Vision:Within last year, Dental: No current dental problems and Receives regular dental care, and STD: The patient denies history of sexually transmitted disease.    Patient Care Team: Samuel Bouche, NP as PCP - General (Nurse Practitioner) Earnstine Regal, PA-C as Physician Assistant (Obstetrics and Gynecology) Silverio Decamp, MD as Consulting Physician (Sports Medicine) Johnathan Hausen, MD as Consulting Physician [facility]) Philemon Kingdom, MD as Consulting Physician (Endocrinology)   Outpatient Medications Prior to Visit  Medication Sig   AMBULATORY NON FORMULARY MEDICATION Knee-high, medium compression, graduated compression stockings. Apply to bilateral lower extremities daily   aspirin 81 MG chewable tablet Chew 81 mg by mouth daily.   aspirin EC 81 MG tablet Take 81 mg by mouth daily.   Cholecalciferol (VITAMIN D) 125 MCG (5000 UT) CAPS Take 5,000 Units by mouth every other day.   Docusate Sodium (COLACE PO) Take 100 mg by mouth daily.   DULoxetine (CYMBALTA) 60 MG capsule Take 1 capsule  by mouth daily.   Elastic  Bandages & Supports (T.E.D. BELOW KNEE/X-LARGE) MISC Apply to lower extremities daily. Remove at bedtime   Insulin Pen Needle (UNIFINE PENTIPS) 31G X 6 MM MISC USE ONCE A DAY TO INJECT SAXENDA   Liraglutide -Weight Management (SAXENDA) 18 MG/3ML SOPN Inject 0.6 mg into the skin daily for 1 week, then increase by 0.6 mg weekly until reaching 3 mg injected into the skin daily daily   NONFORMULARY OR COMPOUNDED ITEM Knee high medium compression stockings. Wear daily. Remove at bedtime   vitamin B-12 (CYANOCOBALAMIN) 1000 MCG tablet Take 1,000 mcg by mouth daily.   [DISCONTINUED] albuterol (VENTOLIN HFA) 108 (90 Base) MCG/ACT inhaler Inhale 2 puffs into the lungs every 4 (four) hours as needed for wheezing or shortness of breath (and cough x 3 days, then prn). (Patient taking differently: Inhale 2 puffs into the lungs every 4 (four) hours as needed for wheezing or shortness of breath.)   [DISCONTINUED] zolpidem (AMBIEN) 5 MG tablet Take 1 tablet (5 mg total) by mouth at bedtime as needed. For sleep   [DISCONTINUED] azithromycin (ZITHROMAX) 250 MG tablet Take 1 tablet (250 mg total) by mouth daily. Take first 2 tablets together, then 1 every day until finished. (Patient not taking: Reported on 11/24/2021)   [DISCONTINUED] azithromycin (ZITHROMAX) 250 MG tablet Take 2 tablets by mouth on day 1 then 1 tablet daily for 4 days (Patient not taking: Reported on 11/24/2021)   [DISCONTINUED] benzonatate (TESSALON) 200  MG capsule Take 1 capsule (200 mg total) by mouth 3 (three) times daily as needed for cough. (Patient not taking: Reported on 11/24/2021)   [DISCONTINUED] DULoxetine (CYMBALTA) 30 MG capsule Take 1 capsule  by mouth daily. (Patient not taking: Reported on 11/24/2021)   No facility-administered medications prior to visit.    Review of Systems  Constitutional:  Negative for chills, fever, malaise/fatigue and weight loss.  HENT:  Negative for congestion, ear pain, hearing loss, sinus pain and sore throat.    Eyes:  Negative for blurred vision, photophobia and pain.  Respiratory:  Negative for cough, shortness of breath and wheezing.   Cardiovascular:  Negative for chest pain, palpitations and leg swelling.  Gastrointestinal:  Negative for abdominal pain, constipation, diarrhea, heartburn, nausea and vomiting.  Genitourinary:  Positive for urgency. Negative for dysuria and frequency.  Musculoskeletal:  Negative for falls and neck pain.  Skin:  Negative for itching and rash.  Neurological:  Negative for dizziness, weakness and headaches.  Endo/Heme/Allergies:  Negative for polydipsia. Does not bruise/bleed easily.  Psychiatric/Behavioral:  Negative for depression, substance abuse and suicidal ideas. The patient is not nervous/anxious.      Objective:    BP 112/71 (BP Location: Right Arm, Cuff Size: Large)   Pulse 75   Resp 20   Ht '5\' 6"'$  (1.676 m)   Wt 297 lb (134.7 kg)   LMP 04/24/2014   SpO2 97%   BMI 47.94 kg/m    Physical Exam Constitutional:      General: She is not in acute distress.    Appearance: Normal appearance. She is obese. She is not ill-appearing.  HENT:     Head: Normocephalic and atraumatic.     Right Ear: Tympanic membrane normal.     Left Ear: Tympanic membrane normal.     Nose: Nose normal.     Mouth/Throat:     Mouth: Mucous membranes are moist.     Pharynx: No oropharyngeal exudate or posterior oropharyngeal erythema.  Eyes:     Extraocular Movements: Extraocular movements intact.     Conjunctiva/sclera: Conjunctivae normal.     Pupils: Pupils are equal, round, and reactive to light.  Neck:     Thyroid: No thyromegaly.     Vascular: No carotid bruit or JVD.     Trachea: Trachea normal.  Cardiovascular:     Rate and Rhythm: Normal rate and regular rhythm.     Pulses: Normal pulses.     Heart sounds: Normal heart sounds. No murmur heard.    No friction rub. No gallop.  Pulmonary:     Effort: Pulmonary effort is normal. No respiratory distress.      Breath sounds: Normal breath sounds. No wheezing.  Abdominal:     General: Bowel sounds are normal. There is no distension.     Palpations: Abdomen is soft.     Tenderness: There is no abdominal tenderness. There is no guarding.  Musculoskeletal:        General: Tenderness (LLE tenderness) present. Normal range of motion.     Cervical back: Normal range of motion and neck supple.     Left lower leg: Edema present.  Skin:    General: Skin is warm and dry.  Neurological:     Mental Status: She is alert and oriented to person, place, and time.     Cranial Nerves: No cranial nerve deficit.  Psychiatric:        Mood and Affect: Mood normal.  Behavior: Behavior normal.        Thought Content: Thought content normal.        Judgment: Judgment normal.   No results found for any visits on 11/24/21.     Assessment & Plan:    Routine Health Maintenance and Physical Exam  Immunization History  Administered Date(s) Administered   Influenza,inj,Quad PF,6+ Mos 02/14/2019   Influenza-Unspecified 02/20/2018, 02/14/2020, 02/13/2021   PFIZER(Purple Top)SARS-COV-2 Vaccination 07/10/2019, 07/31/2019, 04/27/2020   Tdap 09/18/2017   Zoster Recombinat (Shingrix) 12/12/2020, 08/01/2021    Health Maintenance  Topic Date Due   HIV Screening  Never done   Hepatitis C Screening  Never done   PAP SMEAR-Modifier  12/13/2021   MAMMOGRAM  12/12/2021 (Originally 12/31/2019)   COVID-19 Vaccine (4 - Pfizer series) 04/25/2022 (Originally 06/22/2020)   INFLUENZA VACCINE  12/19/2021   COLONOSCOPY (Pts 45-35yr Insurance coverage will need to be confirmed)  04/13/2025   TETANUS/TDAP  09/19/2027   Zoster Vaccines- Shingrix  Completed   HPV VACCINES  Aged Out    Discussed health benefits of physical activity, and encouraged her to engage in regular exercise appropriate for her age and condition.  1. Annual physical exam Checking labs as below. UTD on preventative care. Wellness information provided  with AVS.  - Lipid panel - COMPLETE METABOLIC PANEL WITH GFR - CBC with Differential/Platelet  2. Cervical cancer screening Sees OB/GYN.   3. Encounter for screening mammogram for malignant neoplasm of breast Managed by OB/GYN.   4. Right thyroid nodule Checking TSH.  - TSH  5. Morbid obesity (HCC) Checking A1c.  - Hemoglobin A1c  6. Edema of left lower leg [place] lower extremity. Discussed treatment of venous insufficiency including low sodium diet, adequate hydration, and compression socks/stockings.  - [place]ous Img Lower Unilateral Left; Future  7. Shift work sleep disorder Continue Ambien '5mg'$  at bedtime as needed.  - zolpidem (AMBIEN) 5 MG tablet; Take 1 tablet (5 mg total) by mouth at bedtime as needed for sleep  Dispense: 30 tablet; Refill: 3  Return in about 6 months (around 05/27/2022) for chronic disease follow up.   JSamuel Bouche NP

## 2021-11-24 ENCOUNTER — Ambulatory Visit (INDEPENDENT_AMBULATORY_CARE_PROVIDER_SITE_OTHER): Payer: BC Managed Care – PPO | Admitting: Medical-Surgical

## 2021-11-24 ENCOUNTER — Other Ambulatory Visit (HOSPITAL_COMMUNITY): Payer: Self-pay

## 2021-11-24 ENCOUNTER — Encounter: Payer: Self-pay | Admitting: Medical-Surgical

## 2021-11-24 VITALS — BP 112/71 | HR 75 | Resp 20 | Ht 66.0 in | Wt 297.0 lb

## 2021-11-24 DIAGNOSIS — Z124 Encounter for screening for malignant neoplasm of cervix: Secondary | ICD-10-CM

## 2021-11-24 DIAGNOSIS — E039 Hypothyroidism, unspecified: Secondary | ICD-10-CM | POA: Diagnosis not present

## 2021-11-24 DIAGNOSIS — G4726 Circadian rhythm sleep disorder, shift work type: Secondary | ICD-10-CM

## 2021-11-24 DIAGNOSIS — R600 Localized edema: Secondary | ICD-10-CM

## 2021-11-24 DIAGNOSIS — Z1231 Encounter for screening mammogram for malignant neoplasm of breast: Secondary | ICD-10-CM | POA: Diagnosis not present

## 2021-11-24 DIAGNOSIS — E041 Nontoxic single thyroid nodule: Secondary | ICD-10-CM | POA: Diagnosis not present

## 2021-11-24 DIAGNOSIS — R7309 Other abnormal glucose: Secondary | ICD-10-CM | POA: Diagnosis not present

## 2021-11-24 DIAGNOSIS — Z0000 Encounter for general adult medical examination without abnormal findings: Secondary | ICD-10-CM | POA: Diagnosis not present

## 2021-11-24 DIAGNOSIS — D509 Iron deficiency anemia, unspecified: Secondary | ICD-10-CM | POA: Diagnosis not present

## 2021-11-24 DIAGNOSIS — R350 Frequency of micturition: Secondary | ICD-10-CM

## 2021-11-24 MED ORDER — ALBUTEROL SULFATE HFA 108 (90 BASE) MCG/ACT IN AERS
2.0000 | INHALATION_SPRAY | RESPIRATORY_TRACT | 5 refills | Status: DC | PRN
  Filled 2021-11-24: qty 6.7, 16d supply, fill #0
  Filled 2022-03-25: qty 6.7, 16d supply, fill #1
  Filled 2022-10-04: qty 6.7, 16d supply, fill #2

## 2021-11-24 MED ORDER — ZOLPIDEM TARTRATE 5 MG PO TABS
5.0000 mg | ORAL_TABLET | Freq: Every evening | ORAL | 3 refills | Status: DC | PRN
  Filled 2021-11-24: qty 30, 30d supply, fill #0

## 2021-11-27 NOTE — Progress Notes (Signed)
Labs ordered   Lab Reference ID: [id number]

## 2021-11-30 LAB — COMPLETE METABOLIC PANEL WITH GFR
AG Ratio: 1.5 (calc) (ref 1.0–2.5)
ALT: 18 U/L (ref 6–29)
AST: 19 U/L (ref 10–35)
Albumin: 4.1 g/dL (ref 3.6–5.1)
Alkaline phosphatase (APISO): 51 U/L (ref 37–153)
BUN: 14 mg/dL (ref 7–25)
CO2: 30 mmol/L (ref 20–32)
Calcium: 9 mg/dL (ref 8.6–10.4)
Chloride: 106 mmol/L (ref 98–110)
Creat: 0.77 mg/dL (ref 0.50–1.03)
Globulin: 2.7 g/dL (calc) (ref 1.9–3.7)
Glucose, Bld: 85 mg/dL (ref 65–139)
Potassium: 3.8 mmol/L (ref 3.5–5.3)
Sodium: 144 mmol/L (ref 135–146)
Total Bilirubin: 0.3 mg/dL (ref 0.2–1.2)
Total Protein: 6.8 g/dL (ref 6.1–8.1)
eGFR: 92 mL/min/{1.73_m2} (ref >=60)

## 2021-11-30 LAB — CBC WITH DIFFERENTIAL/PLATELET
Absolute Monocytes: 439 cells/uL (ref 200–950)
Basophils Absolute: 51 cells/uL (ref 0–200)
Basophils Relative: 0.9 %
Eosinophils Absolute: 388 cells/uL (ref 15–500)
Eosinophils Relative: 6.8 %
HCT: 39.9 % (ref 35.0–45.0)
Hemoglobin: 12.7 g/dL (ref 11.7–15.5)
Lymphs Abs: 1550 cells/uL (ref 850–3900)
MCH: 25.6 pg — ABNORMAL LOW (ref 27.0–33.0)
MCHC: 31.8 g/dL — ABNORMAL LOW (ref 32.0–36.0)
MCV: 80.3 fL (ref 80.0–100.0)
MPV: 10.9 fL (ref 7.5–12.5)
Monocytes Relative: 7.7 %
Neutro Abs: 3272 cells/uL (ref 1500–7800)
Neutrophils Relative %: 57.4 %
Platelets: 238 10*3/uL (ref 140–400)
RBC: 4.97 10*6/uL (ref 3.80–5.10)
RDW: 13.7 % (ref 11.0–15.0)
Total Lymphocyte: 27.2 %
WBC: 5.7 10*3/uL (ref 3.8–10.8)

## 2021-11-30 LAB — IRON,TIBC AND FERRITIN PANEL
%SAT: 16 % (calc) (ref 16–45)
Ferritin: 80 ng/mL (ref 16–232)
Iron: 52 ug/dL (ref 45–160)
TIBC: 317 mcg/dL (calc) (ref 250–450)

## 2021-11-30 LAB — TEST AUTHORIZATION 2

## 2021-11-30 LAB — LIPID PANEL
Cholesterol: 182 mg/dL (ref <200)
HDL: 47 mg/dL — ABNORMAL LOW (ref >=50)
LDL Cholesterol (Calc): 113 mg/dL (calc) — ABNORMAL HIGH
Non-HDL Cholesterol (Calc): 135 mg/dL (calc) — ABNORMAL HIGH (ref <130)
Total CHOL/HDL Ratio: 3.9 (calc) (ref <5.0)
Triglycerides: 114 mg/dL (ref <150)

## 2021-11-30 LAB — TEST AUTHORIZATION

## 2021-11-30 LAB — TSH: TSH: 4.93 mIU/L — ABNORMAL HIGH

## 2021-11-30 LAB — HEMOGLOBIN A1C W/OUT EAG: Hgb A1c MFr Bld: 5.6 % of total Hgb (ref <5.7)

## 2021-11-30 LAB — T4, FREE: Free T4: 1.1 ng/dL (ref 0.8–1.8)

## 2021-12-04 ENCOUNTER — Other Ambulatory Visit (HOSPITAL_COMMUNITY): Payer: Self-pay

## 2021-12-04 ENCOUNTER — Other Ambulatory Visit: Payer: Self-pay | Admitting: Medical-Surgical

## 2021-12-06 ENCOUNTER — Other Ambulatory Visit (HOSPITAL_COMMUNITY): Payer: Self-pay

## 2021-12-06 MED ORDER — INSULIN PEN NEEDLE 32G X 4 MM MISC
0 refills | Status: DC
  Filled 2021-12-06: qty 100, 100d supply, fill #0
  Filled 2022-01-26: qty 100, 90d supply, fill #0

## 2021-12-06 MED ORDER — SAXENDA 18 MG/3ML ~~LOC~~ SOPN
3.0000 mg | PEN_INJECTOR | Freq: Every day | SUBCUTANEOUS | 0 refills | Status: DC
  Filled 2021-12-06: qty 15, 30d supply, fill #0
  Filled 2022-01-26: qty 15, 30d supply, fill #1
  Filled 2022-01-31: qty 3, 6d supply, fill #1

## 2021-12-07 ENCOUNTER — Ambulatory Visit (INDEPENDENT_AMBULATORY_CARE_PROVIDER_SITE_OTHER): Payer: BC Managed Care – PPO

## 2021-12-07 DIAGNOSIS — R600 Localized edema: Secondary | ICD-10-CM

## 2021-12-08 ENCOUNTER — Ambulatory Visit: Payer: No Typology Code available for payment source | Admitting: Medical-Surgical

## 2022-01-26 ENCOUNTER — Other Ambulatory Visit (HOSPITAL_COMMUNITY): Payer: Self-pay

## 2022-01-27 ENCOUNTER — Telehealth: Payer: Self-pay

## 2022-01-27 NOTE — Telephone Encounter (Addendum)
Resubmission Prior authorization [id number] '18MG'$ /3ML pen-injectors Via: Covermymeds Case/Key:[id number] Status: approved as of 01/27/22 Reason:Saxenda the authorization is effective from 01/30/2022 to 05/22/2022 Notified Pt via: Mychart

## 2022-01-31 ENCOUNTER — Other Ambulatory Visit (HOSPITAL_COMMUNITY): Payer: Self-pay

## 2022-02-03 ENCOUNTER — Other Ambulatory Visit (HOSPITAL_COMMUNITY): Payer: Self-pay

## 2022-03-25 ENCOUNTER — Other Ambulatory Visit: Payer: Self-pay | Admitting: Medical-Surgical

## 2022-03-26 ENCOUNTER — Other Ambulatory Visit (HOSPITAL_COMMUNITY): Payer: Self-pay

## 2022-03-26 MED ORDER — SAXENDA 18 MG/3ML ~~LOC~~ SOPN
3.0000 mg | PEN_INJECTOR | Freq: Every day | SUBCUTANEOUS | 0 refills | Status: DC
  Filled 2022-03-26: qty 15, 30d supply, fill #0
  Filled 2022-05-27: qty 15, 30d supply, fill #1

## 2022-03-29 ENCOUNTER — Other Ambulatory Visit (HOSPITAL_COMMUNITY): Payer: Self-pay

## 2022-03-29 ENCOUNTER — Ambulatory Visit: Payer: No Typology Code available for payment source | Admitting: Internal Medicine

## 2022-03-30 ENCOUNTER — Other Ambulatory Visit (HOSPITAL_COMMUNITY): Payer: Self-pay

## 2022-03-31 ENCOUNTER — Other Ambulatory Visit (HOSPITAL_COMMUNITY): Payer: Self-pay

## 2022-04-10 ENCOUNTER — Telehealth: Payer: Self-pay

## 2022-04-10 NOTE — Telephone Encounter (Signed)
Initiated Prior authorization [id number] 18MG/3ML pen-injectors Via: Covermymeds Case/Key: Status: denied as of 04/10/22 Reason:his request has not been approved. We were asked to cover a larger amount of the drug listed at the top of this letter than allowed under our quantity limit rule. In order for your request to be approved, your provider would need to show that you have met the guideline rules below. Your provider requested a quantity of six (6) Saxenda 42m/3mL pens per 30 days, but your plan allows for a quantity of five (5) Saxenda 159m3mL pens per 30 days for weight loss. The requested dose of six (6) Saxenda 1815mmL pens per 30 days is higher than the Food and Drug Administration (FDA) daily maximum recommendations for Saxenda for weight loss, which is 3mg7mr day. For approval of a quantity of six (6) Saxenda 18mg67m pens per 30 days, our guideline named GENERAL QUANTITY EXCEPTION CRITERIA (reviewed Saxenda 18mg/41mpens) requires that your provider has submitted two (2) articles from major peer reviewed medical journals, documentation from accepted standards of medical practice or compendia (such as Drugdex, AHFS Drug Information, [employer]) or Clinical Pharmacology) that support the safety and efficacy of the requested drug at the intended dosage for the specified indication. Please note that a prior authorization has been approved for a quantity of five (5) Saxenda 18mg/370mens per 30 days and is valid through 05/22/2022. Please reference prior authorization number [id number]. [id number] has been denied because we did not receive information showing that you meet the requirement listed above. Please work with your provider to use a different medication or get us moreKoreanformation if it will allow us to a[place]orearove this request. A written notification letter will follow with additional details. Notified Pt via: Mychart

## 2022-04-16 ENCOUNTER — Encounter (INDEPENDENT_AMBULATORY_CARE_PROVIDER_SITE_OTHER): Payer: Self-pay

## 2022-05-08 ENCOUNTER — Ambulatory Visit: Payer: No Typology Code available for payment source | Admitting: Internal Medicine

## 2022-05-08 NOTE — Progress Notes (Deleted)
Patient ID: Cynthia [name], female   DOB: 29-Nov-1965, 56 y.o.   MRN: [id number]   HPI  Cynthia [name] is a 56 y.o.-year-old female returning for follow-up for hypercalcemia/hyperparathyroidism and vitamin D deficiency.  Last visit 1 year and 1 months ago.  Interim history: No complaints at today's visit.  She felt much better after her right parathyroidectomy in 04/2020 by Dr. Harlow [name].  Reviewed history: She was diagnosed with hypercalcemia in 2012 and she was found to have a very high PTH in 11/2017.  This was confirmed in 10/2018.  It was difficult to improve her vitamin D level to further investigate and treat her primary hyperparathyroidism, but this was finally confirmed and she had right inferior parathyroidectomy on 04/28/2020.  See investigation below.  During imaging investigation for her hyperparathyroidism she was found to have a right thyroid nodule -right hemithyroidectomy was performed and the final pathology was benign.  Reviewed pertinent results: Lab Results  Component Value Date   PTH 36 06/24/2020   PTH 116 (H) 09/03/2019   PTH Comment 09/03/2019   PTH 207 (H) 10/28/2018   PTH 316 (H) 11/27/2017   CALCIUM 9.0 11/24/2021   CALCIUM 9.0 02/21/2021   CALCIUM 9.0 12/12/2020   CALCIUM 9.5 09/26/2020   CALCIUM 9.4 04/29/2020   CALCIUM 10.8 (H) 04/18/2020   CALCIUM 11.6 (H) 02/24/2020   CALCIUM 11.6 (H) 02/09/2020   CALCIUM 11.5 (H) 09/03/2019   CALCIUM 11.8 (H) 08/11/2019   Component     Latest Ref Rng & Units 06/24/2020  Calcium Ionized     4.5 - 5.6 mg/dL 4.7  Postop: Calcium 8.7, PTH 33  Her vitamin D level finally normalized in 08/2019 so we could further investigate her hyperparathyroidism.  Thyroid ultrasound (09/09/2019): Parenchymal Echotexture: Mildly heterogenous Isthmus: 0.5 cm Right lobe: 6.0 x 3.0 x 3.1 cm Left lobe: 4.1 x 1.5 x 1.3 cm _________________________________________________________   Estimated total number of nodules >/= 1 cm:  1 Number of spongiform nodules >/=  2 cm not described below (TR1): 0 Number of mixed cystic and solid nodules >/= 1.5 cm not described below (TR2): 0 _________________________________________________________   Nodule # 1: Location: Right; Mid Maximum size: 4.6 cm; Other 2 dimensions: 3.2 x 2.4 cm Composition: solid/almost completely solid (2) Echogenicity: isoechoic (1) Echogenic foci: peripheral calcifications (2) and macrocalcifications (1 ACR TI-RADS total points: 6. ACR TI-RADS risk category: TR4 (4-6 points). ACR TI-RADS recommendations: **Given size (>/= 1.5 cm) and appearance, fine needle aspiration of this moderately suspicious nodule should be considered based on TI-RADS criteria.  _______________________________________________________   IMPRESSION: Heterogeneous and slightly ill-defined 4.6 cm TI-RADS category 4 nodule occupying the majority of the right mid gland meets criteria to consider fine-needle aspiration biopsy. Biopsy is recommended.  Thyroid Bx (09/24/2019): Benign:  Clinical History: Right Mid 4.6cm;  Other 2 dimensions:  3.2 x 2.4 cm  solid/almost completely solid isoechoic TI-RADS - 6  Specimen Submitted:  A. THYROID, RMP, FINE NEEDLE ASPIRATION:   FINAL MICROSCOPIC DIAGNOSIS:  - Consistent with benign follicular nodule (Bethesda category II)   SPECIMEN ADEQUACY:  Satisfactory for evaluation   A 24-hour urine calcium level was high: Component     Latest Ref Rng & Units 09/16/2019  Creatinine, 24H Ur     0.50 - 2.15 g/24 h 1.23  Calcium, 24H Urine     mg/24 h 400 (H)   Her calcitriol level was high, while magnesium and phosphorus levels were normal: Component     Latest Ref Rng &  Units 09/03/2019          Vitamin D 1, 25 (OH) Total     18 - 72 pg/mL 115 (H)  Vitamin D3 1, 25 (OH)     pg/mL 115  Vitamin D2 1, 25 (OH)     pg/mL <8  Magnesium     1.5 - 2.5 mg/dL 2.1  Phosphorus     2.3 - 4.6 mg/dL 3.0   Technetium sestamibi scan  (02/03/2020): Right inferior parathyroid adenoma  Right inferior parathyroidectomy and right thyroid lobectomy (04/28/2020): FINAL MICROSCOPIC DIAGNOSIS:  A. THYROID, RIGHT LOBE AND ISTHMUS, THYROIDECTOMY:  - Hypercellular parathyroid with fibrosis and calcification consistent  with adenoma (27 gm).  - Benign thyroid.   The surgery was initially planned for 01/2020 but on 02/10/2020 she triggered a code stroke after she presented with left-sided paresthesia and weakness.  A CT scan of the head and also an MRI of the brain were negative. The next day after surgery, on 04/30/2020, she presented to the emergency room with hypocalcemia symptoms: Tingling and muscle cramps but labs could not be drawn immediately so she left.  She was started on Tums >> now Prn. Symptoms resolved since then. Previously on HCTZ, now on Lasix. She has a history of kidney stone in 2018.  Vitamin D deficiency:  Reviewed vitamin D levels: Lab Results  Component Value Date   VD25OH 40.93 03/29/2021   VD25OH 41.2 09/26/2020   VD25OH 24.9 (L) 06/24/2020   VD25OH 46.0 09/03/2019   VD25OH 22.85 (L) 04/30/2019   VD25OH 23.14 (L) 03/11/2019   VD25OH 21 (L) 10/28/2018   VD25OH 17.47 (L) 02/26/2018   We had significant difficulty in the past normalizing her vitamin D.  She is currently on 5000 alternating with 10,000 units every other day cholecalciferol along with vitamin K2.  No FH of hypercalcemia, pituitary tumors, ?thyroid cancer, MEN sd's, or osteoporosis.  Both her parents had kidney stones.  No fractures, falls, or history of osteoporosis.  Pt. also has a history of LE lymphedema L>R.  She is going to the lymphedema clinic in [place]. Pt has a h/o lap band 2010 >> initially lost approximately 100 pounds, then gained 30 back after she got married.  She is considering a gastric sleeve done in the future.  She is now status post right hemithyroidectomy:    Initially, TSH was high, then it normalized, but at  last check this is again high: Lab Results  Component Value Date   TSH 4.93 (H) 11/24/2021   TSH 3.89 03/29/2021   TSH 3.07 11/24/2020   TSH 5.64 (H) 09/26/2020   TSH 3.930 06/24/2020   TSH 1.90 02/26/2018   ROS: + see HPI Musculoskeletal: no muscle aches/resolved joint aches  I reviewed pt's medications, allergies, PMH, social hx, family hx, and changes were documented in the history of present illness. Otherwise, unchanged from my initial visit note.  Past Medical History:  Diagnosis Date   Anemia    as a child   Asthma    Chronic venous insufficiency 09/29/2017   Deep venous thrombosis of lower extremity ([place]) 11/17/2015   due to [facility] per pt   Depression    GERD (gastroesophageal reflux disease)    History of DVT (deep vein thrombosis)    History of kidney stones    Primary hyperparathyroidism ([place]) 11/28/2017   Sleep apnea    mild no mask   Sleep difficulties    Thyroid disease    Past Surgical History:  Procedure  Laterality Date   CHOLECYSTECTOMY     COLONOSCOPY  2014   GASTRIC RESTRICTION SURGERY     lap band   HERNIA REPAIR     PARATHYROIDECTOMY Right 04/28/2020   Procedure: RIGHT  PARATHYROIDECTOMY;  Surgeon: Armandina Gemma, MD;  Location: [facility];  Service: General;  Laterality: Right;   THYROID LOBECTOMY Right 04/28/2020   Procedure: RIGHT THYROID LOBECTOMY;  Surgeon: Armandina Gemma, MD;  Location: [facility];  Service: General;  Laterality: Right;   Social History   Socioeconomic History   Marital status: Married    Spouse name: Not on file   Number of children: 2   Years of education: Not on file   Highest education level: Not on file  Occupational History    Radiology tech  Tobacco Use   Smoking status: Never Smoker   Smokeless tobacco: Never Used  Substance and Sexual Activity   Alcohol use:  Yes, socially, 4-5 times yearly, liquor/wine   Drug use: No   Sexual activity: Yes    Birth control/protection: Post-menopausal   Current Outpatient Medications on  File Prior to Visit  Medication Sig Dispense Refill   albuterol (VENTOLIN HFA) 108 (90 Base) MCG/ACT inhaler Inhale 2 puffs into the lungs every 4 hours as needed for wheezing or shortness of breath (and cough) for 3 days, then use as needed 6.7 g 5   AMBULATORY NON FORMULARY MEDICATION Knee-high, medium compression, graduated compression stockings. Apply to bilateral lower extremities daily 1 each 0   aspirin 81 MG chewable tablet Chew 81 mg by mouth daily.     aspirin EC 81 MG tablet Take 81 mg by mouth daily.     Cholecalciferol (VITAMIN D) 125 MCG (5000 UT) CAPS Take 5,000 Units by mouth every other day.     Docusate Sodium (COLACE PO) Take 100 mg by mouth daily.     DULoxetine (CYMBALTA) 60 MG capsule Take 1 capsule  by mouth daily. 90 capsule 3   Elastic Bandages & Supports (T.E.D. BELOW KNEE/X-LARGE) MISC Apply to lower extremities daily. Remove at bedtime 2 each 0   Insulin Pen Needle (UNIFINE PENTIPS) 31G X 6 MM MISC USE ONCE A DAY TO INJECT SAXENDA 100 each 0   Insulin Pen Needle 32G X 4 MM MISC Use as directed with Saxenda daily 100 each 0   Liraglutide -Weight Management (SAXENDA) 18 MG/3ML SOPN Inject 0.6 mg into the skin daily for 1 week, then increase by 0.6 mg weekly until reaching 3 mg injected into the skin daily 18 mL 0   NONFORMULARY OR COMPOUNDED ITEM Knee high medium compression stockings. Wear daily. Remove at bedtime 2 each 0   vitamin B-12 (CYANOCOBALAMIN) 1000 MCG tablet Take 1,000 mcg by mouth daily.     zolpidem (AMBIEN) 5 MG tablet Take 1 tablet (5 mg total) by mouth at bedtime as needed for sleep 30 tablet 3   No current facility-administered medications on file prior to visit.   Allergies  Allergen Reactions   Ozempic (0.25 Or 0.5 Mg-Dose) [Semaglutide(0.25 Or 0.'5mg'$ -Dos)]     Dizziness    Latex Rash and Other (See Comments)    Where touched and will spread.   Penicillin G Itching   Family History  Problem Relation Age of Onset   Stroke Mother     Hypertension Mother    Heart disease Father    Heart attack Father    Alcohol abuse Father    Prostate cancer Father    Uterine cancer Maternal Grandmother  PE: LMP 04/24/2014  Wt Readings from Last 3 Encounters:  11/24/21 297 lb (134.7 kg)  11/07/21 280 lb (127 kg)  08/01/21 293 lb (132.9 kg)   Constitutional: overweight, in NAD Eyes:  EOMI, no exophthalmos ENT: no neck masses, no cervical lymphadenopathy Cardiovascular: RRR, No MRG Respiratory: CTA B Musculoskeletal: no deformities Skin:no rashes Neurological: no tremor with outstretched hands  Assessment: 1. Hypercalcemia/hyperparathyroidism  2.  Vitamin D deficiency  3.  Right thyroid fullness  [name]: Patient with history of significant hypercalcemia with the highest blood sugar of 11.8 and also very high parathyroid hormone, at 316, for calcium of 11.6.  Her vitamin D level was very low in the past so we could not proceed right away with surgery, however, her vitamin D level normalized last year on supplementation with high doses of vitamin D so she finally had parathyroid surgery last year: Right parathyroidectomy and she also had right thyroid lobectomy at the same time on 04/28/2020.  Pathology showed hypercellular parathyroid with fibrosis and calcifications consistent with adenoma (27 g).  This nodule measures 3.5 x 3 x 2.7 cm.  Calcium dropped from 11.6 before surgery, to 8.7 after surgery.  PTH dropped from 207 before surgery to 33.  The next day after surgery, on 04/30/2020, she presented to the emergency room with hypocalcemia symptoms: Tingling and muscle cramps but labs could not be drawn immediately so she left.  She was started on daily Tums, which she was only using them 3 times a week at last visit.  Afterwards, symptoms improved, with only occasional tingling in her hands and in his mid face and also some muscle cramps but milder compared to previously.  Her latest calcium was normal.  We are continuing her on  prn Tums. -Most recent calcium level in 11/2021, at 9.0 -She continues to feel much better after the surgery: Her general aches and pains, constipation, and mental fog all improved -No paresthesias, numbness, or muscle cramping -We will continue with vitamin D supplementation (see below).  She also takes Tums as needed. - I will see the patient back in 1 year  2.  Vitamin D deficiency -At last visit, the level was normal, at 41.2.  At that time, we did vitamin K2. -She is on 10,000 units alternating with 5000 units vitamin D every other day -We will recheck the level today  3.  Status post right hemithyroidectomy for right thyroid nodule -She had right hemithyroidectomy at the time of her parathyroidectomy and the final pathology was benign -Latest TSH, free T4, free T3 were normal in 03/2021 -However, the TSH was slightly high in 11/2021, at 4.93.  Free T4 was normal -Will repeat her TFTs now  Philemon Kingdom, MD PhD [facility] Endocrinology

## 2022-05-27 ENCOUNTER — Other Ambulatory Visit: Payer: Self-pay | Admitting: Medical-Surgical

## 2022-05-27 ENCOUNTER — Other Ambulatory Visit (HOSPITAL_COMMUNITY): Payer: Self-pay

## 2022-05-28 ENCOUNTER — Other Ambulatory Visit (HOSPITAL_COMMUNITY): Payer: Self-pay

## 2022-05-28 ENCOUNTER — Encounter: Payer: No Typology Code available for payment source | Admitting: Medical-Surgical

## 2022-05-28 ENCOUNTER — Other Ambulatory Visit: Payer: Self-pay | Admitting: Medical-Surgical

## 2022-05-28 MED ORDER — SAXENDA 18 MG/3ML ~~LOC~~ SOPN
3.0000 mg | PEN_INJECTOR | Freq: Every day | SUBCUTANEOUS | 0 refills | Status: DC
  Filled 2022-05-28 – 2022-11-01 (×3): qty 18, 36d supply, fill #0

## 2022-05-28 MED FILL — Insulin Pen Needle 32 G X 4 MM (1/6" or 5/32"): 90 days supply | Qty: 100 | Fill #0 | Status: AC

## 2022-05-28 NOTE — Progress Notes (Unsigned)
   Established Patient Office Visit  Subjective   Patient ID: Cynthia [name], female   DOB: 11-Feb-1966 Age: 57 y.o. MRN: [id number]   No chief complaint on file.   HPI    Objective:    There were no vitals filed for this visit.  Physical Exam   No results found for this or any previous visit (from the past 24 hour(s)).   {Labs (Optional):23779}  The 10-year ASCVD risk score (Arnett DK, et al., 2019) is: 2.2%   Values used to calculate the score:     Age: 5 years     Sex: Female     Is Non-Hispanic African American: Yes     Diabetic: No     Tobacco smoker: No     Systolic Blood Pressure: 112 mmHg     Is BP treated: No     HDL Cholesterol: 47 mg/dL     Total Cholesterol: 182 mg/dL   Assessment & Plan:   No problem-specific Assessment & Plan notes found for this encounter.   No follow-ups on file.  ___________________________________________ Thayer Ohm, DNP, APRN, FNP-BC Primary Care and Sports Medicine [facility]

## 2022-06-04 ENCOUNTER — Other Ambulatory Visit (HOSPITAL_COMMUNITY): Payer: Self-pay

## 2022-07-10 ENCOUNTER — Other Ambulatory Visit (HOSPITAL_COMMUNITY): Payer: Self-pay

## 2022-07-27 ENCOUNTER — Other Ambulatory Visit (HOSPITAL_COMMUNITY): Payer: Self-pay

## 2022-09-19 DIAGNOSIS — R7309 Other abnormal glucose: Secondary | ICD-10-CM | POA: Diagnosis not present

## 2022-10-04 ENCOUNTER — Other Ambulatory Visit: Payer: Self-pay | Admitting: Medical-Surgical

## 2022-10-04 ENCOUNTER — Other Ambulatory Visit (HOSPITAL_COMMUNITY): Payer: Self-pay

## 2022-10-04 ENCOUNTER — Other Ambulatory Visit: Payer: Self-pay

## 2022-10-04 MED ORDER — DULOXETINE HCL 60 MG PO CPEP
60.0000 mg | ORAL_CAPSULE | Freq: Every day | ORAL | 0 refills | Status: DC
  Filled 2022-10-04 – 2022-10-05 (×2): qty 90, 90d supply, fill #0

## 2022-10-05 ENCOUNTER — Other Ambulatory Visit: Payer: Self-pay

## 2022-10-05 ENCOUNTER — Other Ambulatory Visit (HOSPITAL_COMMUNITY): Payer: Self-pay

## 2022-10-09 ENCOUNTER — Other Ambulatory Visit (HOSPITAL_COMMUNITY): Payer: Self-pay

## 2022-10-20 DIAGNOSIS — R7309 Other abnormal glucose: Secondary | ICD-10-CM | POA: Diagnosis not present

## 2022-10-25 ENCOUNTER — Encounter: Payer: Self-pay | Admitting: Medical-Surgical

## 2022-10-25 ENCOUNTER — Ambulatory Visit (INDEPENDENT_AMBULATORY_CARE_PROVIDER_SITE_OTHER): Payer: BC Managed Care – PPO

## 2022-10-25 ENCOUNTER — Ambulatory Visit: Payer: BC Managed Care – PPO | Admitting: Medical-Surgical

## 2022-10-25 ENCOUNTER — Other Ambulatory Visit (HOSPITAL_COMMUNITY): Payer: Self-pay

## 2022-10-25 ENCOUNTER — Other Ambulatory Visit: Payer: Self-pay | Admitting: Medical-Surgical

## 2022-10-25 VITALS — BP 115/76 | HR 80 | Resp 20 | Ht 66.0 in | Wt 289.7 lb

## 2022-10-25 DIAGNOSIS — M25561 Pain in right knee: Secondary | ICD-10-CM

## 2022-10-25 DIAGNOSIS — Z800 Family history of malignant neoplasm of digestive organs: Secondary | ICD-10-CM

## 2022-10-25 DIAGNOSIS — M11262 Other chondrocalcinosis, left knee: Secondary | ICD-10-CM | POA: Diagnosis not present

## 2022-10-25 DIAGNOSIS — E282 Polycystic ovarian syndrome: Secondary | ICD-10-CM | POA: Insufficient documentation

## 2022-10-25 DIAGNOSIS — G8929 Other chronic pain: Secondary | ICD-10-CM | POA: Diagnosis not present

## 2022-10-25 DIAGNOSIS — M25562 Pain in left knee: Secondary | ICD-10-CM

## 2022-10-25 DIAGNOSIS — M1711 Unilateral primary osteoarthritis, right knee: Secondary | ICD-10-CM | POA: Diagnosis not present

## 2022-10-25 DIAGNOSIS — R109 Unspecified abdominal pain: Secondary | ICD-10-CM | POA: Insufficient documentation

## 2022-10-25 DIAGNOSIS — M11261 Other chondrocalcinosis, right knee: Secondary | ICD-10-CM | POA: Diagnosis not present

## 2022-10-25 DIAGNOSIS — M1712 Unilateral primary osteoarthritis, left knee: Secondary | ICD-10-CM | POA: Diagnosis not present

## 2022-10-25 HISTORY — DX: Family history of malignant neoplasm of digestive organs: Z80.0

## 2022-10-25 MED ORDER — METHYLPREDNISOLONE ACETATE 80 MG/ML IJ SUSP
80.0000 mg | Freq: Once | INTRAMUSCULAR | Status: AC
  Administered 2022-10-25: 80 mg via INTRAMUSCULAR

## 2022-10-25 MED ORDER — CELECOXIB 200 MG PO CAPS
200.0000 mg | ORAL_CAPSULE | Freq: Two times a day (BID) | ORAL | 1 refills | Status: DC
  Filled 2022-10-25: qty 60, 30d supply, fill #0
  Filled 2022-11-19: qty 60, 30d supply, fill #1

## 2022-10-25 MED ORDER — TRAMADOL HCL 50 MG PO TABS
50.0000 mg | ORAL_TABLET | Freq: Three times a day (TID) | ORAL | 0 refills | Status: AC | PRN
  Filled 2022-10-25: qty 15, 5d supply, fill #0

## 2022-10-25 NOTE — Progress Notes (Signed)
        Established patient visit  History, exam, impression, and plan:  1. Chronic pain of both knees Pleasant 57 year old female presenting today with a history of bilateral knee osteoarthritis.  She reports in April, she had a fall down the stairs and landed on her knees and a twisting motion.  She was okay immediately after however she has been having worsening pain in her bilateral knees.  She has tried taking Aleve but that has not helped.  She is having episodes of locking up in both knees but no popping, clicking, or locking.  On exam, crepitus with range of motion.  Significant right knee tenderness and discomfort with range of motion.  Left knee mildly tender with minimal pain.  Recommend conservative approach.  She would like to avoid oral steroids today but was agreeable to an injection.  Depo-Medrol 80 mg given x 1 in the office.  Planning for updated x-rays bilaterally.  Referring to physical therapy.  Adding Celebrex 200 mg twice daily as needed.  If no improvement in 4-6 weeks, return to discuss further investigation/treatment recommendations with Dr. Benjamin Stain. - DG Knee Complete 4 Views Right; Future - DG Knee Complete 4 Views Left; Future - Ambulatory referral to Physical Therapy - methylPREDNISolone acetate (DEPO-MEDROL) injection 80 mg  Procedures performed this visit: None.  Return for Bilateral knee pain with Dr. Karie Schwalbe in 4-6 weeks if no improvement.  __________________________________ Thayer Ohm, DNP, APRN, FNP-BC Primary Care and Sports Medicine [facility] [place]

## 2022-11-01 ENCOUNTER — Other Ambulatory Visit (HOSPITAL_COMMUNITY): Payer: Self-pay

## 2022-11-01 ENCOUNTER — Other Ambulatory Visit: Payer: Self-pay

## 2022-11-06 ENCOUNTER — Ambulatory Visit: Payer: BC Managed Care – PPO | Attending: Medical-Surgical | Admitting: Physical Therapy

## 2022-11-06 NOTE — Therapy (Deleted)
OUTPATIENT PHYSICAL THERAPY LOWER EXTREMITY EVALUATION   Patient Name: Cynthia [name] MRN: [id number] DOB:Sep 28, 1965, 57 y.o., female Today's Date: 11/06/2022  END OF SESSION:   Past Medical History:  Diagnosis Date   Anemia    as a child   Asthma    Chronic venous insufficiency 09/29/2017   Deep venous thrombosis of lower extremity ([facility]) 11/17/2015   due to [facility] per pt   Depression    GERD (gastroesophageal reflux disease)    History of DVT (deep vein thrombosis)    History of kidney stones    Primary hyperparathyroidism ([facility]) 11/28/2017   Sleep apnea    mild no mask   Sleep difficulties    Thyroid disease    Past Surgical History:  Procedure Laterality Date   CHOLECYSTECTOMY     COLONOSCOPY  2014   GASTRIC RESTRICTION SURGERY     lap band   HERNIA REPAIR     PARATHYROIDECTOMY Right 04/28/2020   Procedure: RIGHT  PARATHYROIDECTOMY;  Surgeon: Darnell Level, MD;  Location: [facility];  Service: General;  Laterality: Right;   THYROID LOBECTOMY Right 04/28/2020   Procedure: RIGHT THYROID LOBECTOMY;  Surgeon: Darnell Level, MD;  Location: [facility];  Service: General;  Laterality: Right;   Patient Active Problem List   Diagnosis Date Noted   Abdominal pain 10/25/2022   Family history of malignant neoplasm of gastrointestinal tract 10/25/2022   Polycystic ovaries 10/25/2022   Primary hyperparathyroidism (HCC) 04/28/2020   Right thyroid nodule 02/28/2020   Depressive disorder 03/18/2019   Venous insufficiency of both lower extremities 11/11/2018   Class 3 severe obesity due to excess calories with serious comorbidity and body mass index (BMI) of 45.0 to 49.9 in adult (HCC) 11/11/2018   Shortness of breath 10/23/2018   Chronic cough 10/23/2018   Bradycardia with 51-60 beats per minute 10/09/2018   Laryngospasms 10/09/2018   Dyspepsia 10/09/2018   Bronchospasm, acute 05/27/2018   Microcytic anemia 11/13/2017   Hypercalcemia 11/13/2017   Sacroiliac joint dysfunction of both  sides 10/21/2017   Lumbar degenerative disc disease 10/21/2017   Primary osteoarthritis of left knee 09/19/2017   Peripheral edema 09/19/2017   Morbid obesity with BMI of 40.0-44.9, adult (HCC) 09/18/2017   Urinary incontinence 09/18/2017   History of DVT (deep vein thrombosis)    LGSIL on Pap smear of cervix 11/22/2015   Migraine 11/17/2015   Vitamin D deficiency 11/17/2015   Lapband APL + HH repair (1 suture) 01/28/2012    PCP: Christen Butter, NP  REFERRING PROVIDER: Christen Butter, NP  REFERRING DIAG: M25.561,M25.562,G89.29 (ICD-10-CM) - Chronic pain of both knees  THERAPY DIAG:  No diagnosis found.  Rationale for Evaluation and Treatment: Rehabilitation  ONSET DATE: ***  SUBJECTIVE:   SUBJECTIVE STATEMENT: ***  PERTINENT HISTORY: *** PAIN:  Are you having pain? {OPRCPAIN:27236}  PRECAUTIONS: {Therapy precautions:24002}  WEIGHT BEARING RESTRICTIONS: {Yes ***/No:24003}  FALLS:  Has patient fallen in last 6 months? {fallsyesno:27318}  LIVING ENVIRONMENT: Lives with: {OPRC lives with:25569::"lives with their family"} Lives in: {Lives in:25570} Stairs: {opstairs:27293} Has following equipment at home: {Assistive devices:23999}  OCCUPATION: ***  PLOF: {PLOF:24004}  PATIENT GOALS: ***  NEXT MD VISIT: ***  OBJECTIVE:   DIAGNOSTIC FINDINGS: ***  PATIENT SURVEYS:  {rehab surveys:24030}  COGNITION: Overall cognitive status: {cognition:24006}     SENSATION: {sensation:27233}  EDEMA:  {edema:24020}  MUSCLE LENGTH: Hamstrings: Right *** deg; Left *** deg Thomas test: Right *** deg; Left *** deg  POSTURE: {posture:25561}  PALPATION: ***  LOWER EXTREMITY ROM:  {  AROM/PROM:27142} ROM Right eval Left eval  Hip flexion    Hip extension    Hip abduction    Hip adduction    Hip internal rotation    Hip external rotation    Knee flexion    Knee extension    Ankle dorsiflexion    Ankle plantarflexion    Ankle inversion    Ankle eversion      (Blank rows = not tested)  LOWER EXTREMITY MMT:  MMT Right eval Left eval  Hip flexion    Hip extension    Hip abduction    Hip adduction    Hip internal rotation    Hip external rotation    Knee flexion    Knee extension    Ankle dorsiflexion    Ankle plantarflexion    Ankle inversion    Ankle eversion     (Blank rows = not tested)  LOWER EXTREMITY SPECIAL TESTS:  {LEspecialtests:26242}  FUNCTIONAL TESTS:  {Functional tests:24029}  GAIT: Distance walked: *** Assistive device utilized: {Assistive devices:23999} Level of assistance: {Levels of assistance:24026} Comments: ***   TODAY'S TREATMENT:                                                                                                                              DATE: ***    PATIENT EDUCATION:  Education details: *** Person educated: {Person educated:25204} Education method: {Education Method:25205} Education comprehension: {Education Comprehension:25206}  HOME EXERCISE PROGRAM: ***  ASSESSMENT:  CLINICAL IMPRESSION: Patient is a *** y.o. *** who was seen today for physical therapy evaluation and treatment for ***.   OBJECTIVE IMPAIRMENTS: {opptimpairments:25111}.   ACTIVITY LIMITATIONS: {activitylimitations:27494}  PARTICIPATION LIMITATIONS: {participationrestrictions:25113}  PERSONAL FACTORS: {Personal factors:25162} are also affecting patient's functional outcome.   REHAB POTENTIAL: {rehabpotential:25112}  CLINICAL DECISION MAKING: {clinical decision making:25114}  EVALUATION COMPLEXITY: {Evaluation complexity:25115}   GOALS: Goals reviewed with patient? {yes/no:20286}  SHORT TERM GOALS: Target date: *** *** Baseline: Goal status: {GOALSTATUS:25110}  2.  *** Baseline:  Goal status: {GOALSTATUS:25110}  3.  *** Baseline:  Goal status: {GOALSTATUS:25110}  4.  *** Baseline:  Goal status: {GOALSTATUS:25110}  5.  *** Baseline:  Goal status: {GOALSTATUS:25110}  6.   *** Baseline:  Goal status: {GOALSTATUS:25110}  LONG TERM GOALS: Target date: ***  *** Baseline:  Goal status: {GOALSTATUS:25110}  2.  *** Baseline:  Goal status: {GOALSTATUS:25110}  3.  *** Baseline:  Goal status: {GOALSTATUS:25110}  4.  *** Baseline:  Goal status: {GOALSTATUS:25110}  5.  *** Baseline:  Goal status: {GOALSTATUS:25110}  6.  *** Baseline:  Goal status: {GOALSTATUS:25110}   PLAN:  PT FREQUENCY: {rehab frequency:25116}  PT DURATION: {rehab duration:25117}  PLANNED INTERVENTIONS: {rehab planned interventions:25118::"Therapeutic exercises","Therapeutic activity","Neuromuscular re-education","Balance training","Gait training","Patient/Family education","Self Care","Joint mobilization"}  PLAN FOR NEXT SESSION: ***   Conita Amenta April Ma L Lear Carstens, PT 11/06/2022, 1:01 PM

## 2022-11-19 DIAGNOSIS — R7309 Other abnormal glucose: Secondary | ICD-10-CM | POA: Diagnosis not present

## 2022-11-21 ENCOUNTER — Other Ambulatory Visit (HOSPITAL_COMMUNITY): Payer: Self-pay

## 2022-11-28 ENCOUNTER — Ambulatory Visit (INDEPENDENT_AMBULATORY_CARE_PROVIDER_SITE_OTHER): Payer: BC Managed Care – PPO | Admitting: Medical-Surgical

## 2022-11-28 ENCOUNTER — Encounter: Payer: Self-pay | Admitting: Medical-Surgical

## 2022-11-28 ENCOUNTER — Encounter: Payer: Self-pay | Admitting: Physical Therapy

## 2022-11-28 ENCOUNTER — Other Ambulatory Visit (HOSPITAL_COMMUNITY): Payer: Self-pay

## 2022-11-28 ENCOUNTER — Ambulatory Visit: Payer: BC Managed Care – PPO | Attending: Medical-Surgical | Admitting: Physical Therapy

## 2022-11-28 ENCOUNTER — Other Ambulatory Visit: Payer: Self-pay

## 2022-11-28 VITALS — BP 118/76 | HR 71 | Ht 66.0 in | Wt 300.7 lb

## 2022-11-28 DIAGNOSIS — I872 Venous insufficiency (chronic) (peripheral): Secondary | ICD-10-CM

## 2022-11-28 DIAGNOSIS — E039 Hypothyroidism, unspecified: Secondary | ICD-10-CM

## 2022-11-28 DIAGNOSIS — E210 Primary hyperparathyroidism: Secondary | ICD-10-CM | POA: Diagnosis not present

## 2022-11-28 DIAGNOSIS — G4726 Circadian rhythm sleep disorder, shift work type: Secondary | ICD-10-CM | POA: Diagnosis not present

## 2022-11-28 DIAGNOSIS — M25562 Pain in left knee: Secondary | ICD-10-CM | POA: Insufficient documentation

## 2022-11-28 DIAGNOSIS — M25561 Pain in right knee: Secondary | ICD-10-CM | POA: Insufficient documentation

## 2022-11-28 DIAGNOSIS — R7989 Other specified abnormal findings of blood chemistry: Secondary | ICD-10-CM | POA: Diagnosis not present

## 2022-11-28 DIAGNOSIS — G8929 Other chronic pain: Secondary | ICD-10-CM | POA: Diagnosis not present

## 2022-11-28 DIAGNOSIS — Z0000 Encounter for general adult medical examination without abnormal findings: Secondary | ICD-10-CM | POA: Diagnosis not present

## 2022-11-28 DIAGNOSIS — M6281 Muscle weakness (generalized): Secondary | ICD-10-CM | POA: Diagnosis not present

## 2022-11-28 DIAGNOSIS — Z13220 Encounter for screening for lipoid disorders: Secondary | ICD-10-CM

## 2022-11-28 DIAGNOSIS — R262 Difficulty in walking, not elsewhere classified: Secondary | ICD-10-CM | POA: Diagnosis not present

## 2022-11-28 DIAGNOSIS — R0683 Snoring: Secondary | ICD-10-CM

## 2022-11-28 MED ORDER — ZOLPIDEM TARTRATE 5 MG PO TABS
5.0000 mg | ORAL_TABLET | Freq: Every evening | ORAL | 3 refills | Status: DC | PRN
  Filled 2022-11-28: qty 30, 30d supply, fill #0

## 2022-11-28 MED ORDER — CELECOXIB 200 MG PO CAPS
200.0000 mg | ORAL_CAPSULE | Freq: Two times a day (BID) | ORAL | 1 refills | Status: DC
  Filled 2022-11-28 – 2023-01-14 (×3): qty 180, 90d supply, fill #0
  Filled 2023-07-02: qty 180, 90d supply, fill #1

## 2022-11-28 MED ORDER — ALBUTEROL SULFATE HFA 108 (90 BASE) MCG/ACT IN AERS
2.0000 | INHALATION_SPRAY | RESPIRATORY_TRACT | 5 refills | Status: AC | PRN
  Filled 2022-11-28: qty 6.7, 16d supply, fill #0

## 2022-11-28 MED ORDER — DULOXETINE HCL 60 MG PO CPEP
60.0000 mg | ORAL_CAPSULE | Freq: Every day | ORAL | 3 refills | Status: DC
  Filled 2022-11-28 – 2023-01-14 (×3): qty 90, 90d supply, fill #0

## 2022-11-28 NOTE — Therapy (Signed)
OUTPATIENT PHYSICAL THERAPY LOWER EXTREMITY EVALUATION   Patient Name: Cynthia [name] MRN: [id number] DOB:1965-11-02, 57 y.o., female Today's Date: 11/28/2022  END OF SESSION:  PT End of Session - 11/28/22 1053     Visit Number 1    Number of Visits 16    Date for PT Re-Evaluation 01/23/23    Authorization Type [employer]    PT Start Time 1008    PT Stop Time 1050    PT Time Calculation (min) 42 min    Activity Tolerance Patient tolerated treatment well    Behavior During Therapy WFL for tasks assessed/performed             Past Medical History:  Diagnosis Date   Anemia    as a child   Asthma    Chronic venous insufficiency 09/29/2017   Deep venous thrombosis of lower extremity (HCC) 11/17/2015   due to [facility] per pt   Depression    GERD (gastroesophageal reflux disease)    History of DVT (deep vein thrombosis)    History of kidney stones    Primary hyperparathyroidism (HCC) 11/28/2017   Sleep apnea    mild no mask   Sleep difficulties    Thyroid disease    Past Surgical History:  Procedure Laterality Date   CHOLECYSTECTOMY     COLONOSCOPY  2014   GASTRIC RESTRICTION SURGERY     lap band   HERNIA REPAIR     PARATHYROIDECTOMY Right 04/28/2020   Procedure: RIGHT  PARATHYROIDECTOMY;  Surgeon: Darnell Level, MD;  Location: [facility];  Service: General;  Laterality: Right;   THYROID LOBECTOMY Right 04/28/2020   Procedure: RIGHT THYROID LOBECTOMY;  Surgeon: Darnell Level, MD;  Location: [facility];  Service: General;  Laterality: Right;   Patient Active Problem List   Diagnosis Date Noted   Abdominal pain 10/25/2022   Family history of malignant neoplasm of gastrointestinal tract 10/25/2022   Polycystic ovaries 10/25/2022   Primary hyperparathyroidism (HCC) 04/28/2020   Right thyroid nodule 02/28/2020   Depressive disorder 03/18/2019   Venous insufficiency of both lower extremities 11/11/2018   Class 3 severe obesity due to excess calories with serious comorbidity and body  mass index (BMI) of 45.0 to 49.9 in adult (HCC) 11/11/2018   Shortness of breath 10/23/2018   Chronic cough 10/23/2018   Bradycardia with 51-60 beats per minute 10/09/2018   Laryngospasms 10/09/2018   Dyspepsia 10/09/2018   Bronchospasm, acute 05/27/2018   Microcytic anemia 11/13/2017   Hypercalcemia 11/13/2017   Sacroiliac joint dysfunction of both sides 10/21/2017   Lumbar degenerative disc disease 10/21/2017   Primary osteoarthritis of left knee 09/19/2017   Peripheral edema 09/19/2017   Morbid obesity with BMI of 40.0-44.9, adult (HCC) 09/18/2017   Urinary incontinence 09/18/2017   History of DVT (deep vein thrombosis)    LGSIL on Pap smear of cervix 11/22/2015   Migraine 11/17/2015   Vitamin D deficiency 11/17/2015   Lapband APL + HH repair (1 suture) 01/28/2012    PCP: Christen Butter  REFERRING PROVIDER: Christen Butter  REFERRING DIAG: Chronic pain of bilateral knees  THERAPY DIAG:  Chronic pain of both knees  Muscle weakness (generalized)  Difficulty in walking, not elsewhere classified  Rationale for Evaluation and Treatment: Rehabilitation  ONSET DATE: 08/31/22  SUBJECTIVE:   SUBJECTIVE STATEMENT: Pt had a fall while going down stairs and caught her foot on her long dress, she tried to catch herself from falling and her Rt knee got twisted. Since that time she states  she feels like "I walk like I'm old" and she has a continuous ache which is getting worse. She states pain increases with turning Rt and pivoting on her knee. She states if she is "up and around for 2-3 hours" her knee will be sore. Pain decreases with meds.  PERTINENT HISTORY: lymphodema PAIN:  Are you having pain? Yes: NPRS scale: 5/10 currently, 12/10 at worst/10 Pain location: Rt knee Pain description: sharp, sore Aggravating factors: twisting Rt knee, turning Relieving factors: meds  PRECAUTIONS: None  WEIGHT BEARING RESTRICTIONS: No  FALLS:  Has patient fallen in last 6 months? Yes.  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 Mild chondrocalcinosis   EDEMA:  Pt has lymphodema Lt > Rt LE   PALPATION: Patella mobility WFL  LOWER EXTREMITY ROM:  Active ROM Right eval Left eval  Hip flexion    Hip extension    Hip abduction    Hip adduction    Hip internal rotation    Hip external rotation    Knee flexion 91 115  Knee extension 0 0  Ankle dorsiflexion    Ankle plantarflexion    Ankle inversion    Ankle eversion     (Blank rows = not tested)  LOWER EXTREMITY MMT:  MMT Right eval Left eval  Hip flexion 4- Hip flex with ER 3 4 Hip flex with ER 3+  Hip extension    Hip abduction 3 3+  Hip adduction    Hip internal rotation    Hip external rotation    Knee flexion 4- 4  Knee extension 4- 4  Ankle dorsiflexion    Ankle plantarflexion    Ankle inversion    Ankle eversion     (Blank rows = not tested)   FUNCTIONAL TESTS:  SLS: Rt unable, Lt 5 sec  GAIT: Distance walked: 100' Assistive device utilized: None Level of assistance: Complete Independence Comments: mild antalgic gait, decreased stance on Rt Stairs: step to pattern with use of railings to descend   TODAY'S TREATMENT:                                                                                                                              DATE: 11/28/22 See HEP    PATIENT EDUCATION:  Education details: PT POC and goals, HEP Person educated: Patient Education method: Explanation, Demonstration, and Handouts Education comprehension: verbalized understanding and returned demonstration  HOME EXERCISE PROGRAM: Access Code: [email or web address] URL: [email or web address] Date: 11/28/2022 Prepared by: Reggy Eye  Exercises - Hip Abduction with Resistance Loop  - 1 x daily - 7 x weekly - 3 sets - 10 reps - Hip Extension with Resistance Loop  - 1 x daily - 7 x weekly -  3 sets - 10 reps - Seated Hamstring Curl with Anchored Resistance  - 1 x daily - 7 x weekly - 3 sets - 10 reps - Seated Knee Flexion Stretch  - 1 x daily - 7 x weekly - 1 sets - 10 reps - 10 seconds hold - Straight Leg Raise with External Rotation  - 1 x daily - 7 x weekly - 3 sets - 10 reps  ASSESSMENT:  CLINICAL IMPRESSION: Patient is a 57 y.o. female who was seen today for physical therapy evaluation and treatment for chronic pain of bilateral knees. Pt presents with decreased strength and ROM, impaired gait and balance and will benefit from skilled PT to address deficits and improve functional activity tolerance.   OBJECTIVE IMPAIRMENTS: decreased activity tolerance, difficulty walking, decreased ROM, decreased strength, and pain.   ACTIVITY LIMITATIONS: stairs and locomotion level  PARTICIPATION LIMITATIONS: shopping, community activity, and occupation  PERSONAL FACTORS: Time since onset of injury/illness/exacerbation are also affecting patient's functional outcome.   REHAB POTENTIAL: Good  CLINICAL DECISION MAKING: Stable/uncomplicated  EVALUATION COMPLEXITY: Low   GOALS: Goals reviewed with patient? Yes  SHORT TERM GOALS: Target date: 12/26/2022   Pt will be independent in initial HEP Baseline: Goal status: INITIAL  2.  Pt will improve Rt knee flexion ROM to 100 to improve tolerance to stair negotiation Baseline:  Goal status: INITIAL    LONG TERM GOALS: Target date: 01/23/2023    Pt will be independent with advanced HEP Baseline:  Goal status: INITIAL  2.  Pt will improve knee strength bilat to 4+/5 to improve standing and walking tolerance Baseline:  Goal status: INITIAL  3.  Pt will be able to negotiation stairs with reciprocal pattern with 1 handrail with pain <= 2/10 Baseline:  Goal  status: INITIAL  4.  Pt will demo improved balance by performing SLS >= 15 sec bilat Baseline:  Goal status: INITIAL    PLAN:  PT FREQUENCY: 1-2x/week  PT DURATION: 8 weeks  PLANNED INTERVENTIONS: Therapeutic exercises, Therapeutic activity, Neuromuscular re-education, Balance training, Gait training, Patient/Family education, Self Care, Joint mobilization, Aquatic Therapy, Dry Needling, Electrical stimulation, Cryotherapy, Moist heat, Taping, Vasopneumatic device, Ultrasound, Ionotophoresis 4mg /ml Dexamethasone, Manual therapy, and Re-evaluation  PLAN FOR NEXT SESSION: assess response to HEP, knee strength and stability   Kjersten Ormiston, PT 11/28/2022, 10:53 AM

## 2022-11-28 NOTE — Progress Notes (Signed)
Complete physical exam  Patient: Cynthia [name]   DOB: Aug 18, 1965   57 y.o. Female  MRN: [id number]  Subjective:    Chief Complaint  Patient presents with   Annual Exam    Pt is here today for her physical. States she has been retaining fluid and has had a 10 pound weight gain in 2 weeks time.    Cynthia [name] is a 57 y.o. female who presents today for a complete physical exam. She reports consuming a general diet.  Treadmill, walking, gardening for exercise.  She generally feels fairly well. She reports sleeping fairly well. She does not have additional problems to discuss today.    Most recent fall risk assessment:    11/28/2022    8:48 AM  Fall Risk   Falls in the past year? 1  Number falls in past yr: 0  Injury with Fall? 1  Risk for fall due to : History of fall(s)  Follow up Falls evaluation completed     Most recent depression screenings:    11/28/2022    8:48 AM 10/25/2022   10:32 AM  PHQ 2/9 Scores  PHQ - 2 Score 1 2  PHQ- 9 Score  15    Vision:Within last year, Dental: No current dental problems and Receives regular dental care, and STD: The patient denies history of sexually transmitted disease.    Patient Care Team: Christen Butter, NP as PCP - General (Nurse Practitioner) Henreitta Leber, PA-C as Physician Assistant (Obstetrics and Gynecology) Monica Becton, MD as Consulting Physician (Sports Medicine) Luretha Murphy, MD as Consulting Physician [facility]) Carlus Pavlov, MD as Consulting Physician (Endocrinology)   Outpatient Medications Prior to Visit  Medication Sig   AMBULATORY NON FORMULARY MEDICATION Knee-high, medium compression, graduated compression stockings. Apply to bilateral lower extremities daily   aspirin EC 81 MG tablet Take 81 mg by mouth daily.   Cholecalciferol (VITAMIN D) 125 MCG (5000 UT) CAPS Take 5,000 Units by mouth every other day.   Docusate Sodium (COLACE PO) Take 100 mg by mouth daily.   Elastic  Bandages & Supports (T.E.D. BELOW KNEE/X-LARGE) MISC Apply to lower extremities daily. Remove at bedtime   NONFORMULARY OR COMPOUNDED ITEM Knee high medium compression stockings. Wear daily. Remove at bedtime   vitamin B-12 (CYANOCOBALAMIN) 1000 MCG tablet Take 1,000 mcg by mouth daily.   [DISCONTINUED] albuterol (VENTOLIN HFA) 108 (90 Base) MCG/ACT inhaler Inhale 2 puffs into the lungs every 4 hours as needed for wheezing or shortness of breath (and cough) for 3 days, then use as needed   [DISCONTINUED] celecoxib (CELEBREX) 200 MG capsule Take 1 capsule (200 mg) by mouth 2 times daily.   [DISCONTINUED] DULoxetine (CYMBALTA) 60 MG capsule Take 1 capsule (60 mg total) by mouth daily. NEEDS APPOINTMENT FOR FURTHER REFILLS.   [DISCONTINUED] Insulin Pen Needle (TECHLITE PEN NEEDLES) 32G X 4 MM MISC Use as directed with Saxenda daily   [DISCONTINUED] zolpidem (AMBIEN) 5 MG tablet Take 1 tablet (5 mg total) by mouth at bedtime as needed for sleep   [DISCONTINUED] Liraglutide -Weight Management (SAXENDA) 18 MG/3ML SOPN Inject 0.6 mg into the skin daily for 1 week, then increase by 0.6 mg weekly until reaching 3 mg injected into the skin daily (Patient not taking: Reported on 11/28/2022)   No facility-administered medications prior to visit.   Review of Systems  Constitutional:  Negative for chills, fever, malaise/fatigue and weight loss.  HENT:  Negative for congestion, ear pain, hearing loss, sinus pain and  sore throat.   Eyes:  Negative for blurred vision, photophobia and pain.  Respiratory:  Negative for cough, shortness of breath and wheezing.   Cardiovascular:  Positive for leg swelling. Negative for chest pain and palpitations.  Gastrointestinal:  Negative for abdominal pain, constipation, diarrhea, heartburn, nausea and vomiting.  Genitourinary:  Negative for dysuria, frequency and urgency.  Musculoskeletal:  Negative for falls and neck pain.  Skin:  Negative for itching and rash.  Neurological:   Negative for dizziness, weakness and headaches.  Endo/Heme/Allergies:  Negative for polydipsia. Does not bruise/bleed easily.  Psychiatric/Behavioral:  Negative for depression, substance abuse and suicidal ideas. The patient is not nervous/anxious.      Objective:    BP 118/76 (BP Location: Left Arm, Patient Position: Sitting, Cuff Size: Large)   Pulse 71   Ht 5\' 6"  (1.676 m)   Wt (!) 300 lb 11.2 oz (136.4 kg)   LMP 04/24/2014   SpO2 99%   BMI 48.53 kg/m    Physical Exam Vitals reviewed.  Constitutional:      General: She is not in acute distress.    Appearance: Normal appearance. She is obese. She is not ill-appearing.  HENT:     Head: Normocephalic and atraumatic.     Right Ear: Tympanic membrane, ear canal and external ear normal. There is no impacted cerumen.     Left Ear: Tympanic membrane, ear canal and external ear normal. There is no impacted cerumen.     Nose: Nose normal. No congestion or rhinorrhea (.diagm).     Mouth/Throat:     Mouth: Mucous membranes are moist.     Pharynx: No oropharyngeal exudate or posterior oropharyngeal erythema.  Eyes:     General: No scleral icterus.       Right eye: No discharge.        Left eye: No discharge.     Extraocular Movements: Extraocular movements intact.     Conjunctiva/sclera: Conjunctivae normal.     Pupils: Pupils are equal, round, and reactive to light.  Neck:     Thyroid: No thyromegaly.     Vascular: No carotid bruit or JVD.     Trachea: Trachea normal.  Cardiovascular:     Rate and Rhythm: Normal rate and regular rhythm.     Pulses: Normal pulses.     Heart sounds: Normal heart sounds. No murmur heard.    No friction rub. No gallop.  Pulmonary:     Effort: Pulmonary effort is normal. No respiratory distress.     Breath sounds: Normal breath sounds. No wheezing.  Abdominal:     General: Bowel sounds are normal. There is no distension.     Palpations: Abdomen is soft.     Tenderness: There is no abdominal  tenderness. There is no guarding.  Musculoskeletal:        General: Normal range of motion.     Cervical back: Normal range of motion and neck supple.     Right lower leg: Edema present.     Left lower leg: Edema present.  Lymphadenopathy:     Cervical: No cervical adenopathy.  Skin:    General: Skin is warm and dry.  Neurological:     Mental Status: She is alert and oriented to person, place, and time.     Cranial Nerves: No cranial nerve deficit.  Psychiatric:        Mood and Affect: Mood normal.        Behavior: Behavior normal.  Thought Content: Thought content normal.        Judgment: Judgment normal.   No results found for any visits on 11/28/22.     Assessment & Plan:    Routine Health Maintenance and Physical Exam  Immunization History  Administered Date(s) Administered   Influenza,inj,Quad PF,6+ Mos 02/14/2019   Influenza-Unspecified 02/20/2018, 02/14/2020, 02/13/2021, 02/11/2022   PFIZER(Purple Top)SARS-COV-2 Vaccination 07/10/2019, 07/31/2019, 04/27/2020   Tdap 09/18/2017   Zoster Recombinant(Shingrix) 12/12/2020, 08/01/2021    Health Maintenance  Topic Date Due   HIV Screening  Never done   Hepatitis C Screening  Never done   MAMMOGRAM  12/31/2019   PAP SMEAR-Modifier  12/13/2021   COVID-19 Vaccine (4 - 2023-24 season) 01/19/2022   INFLUENZA VACCINE  12/20/2022   Colonoscopy  04/13/2025   DTaP/Tdap/Td (2 - Td or Tdap) 09/19/2027   Zoster Vaccines- Shingrix  Completed   HPV VACCINES  Aged Out    Discussed health benefits of physical activity, and encouraged her to engage in regular exercise appropriate for her age and condition.  1. Shift work sleep disorder Refilling Ambien for as needed use. - zolpidem (AMBIEN) 5 MG tablet; Take 1 tablet (5 mg total) by mouth at bedtime as needed for sleep  Dispense: 30 tablet; Refill: 3  2. Annual physical exam Checking labs as below.  Up-to-date on preventative care.  Wellness information provided with  AVS. - Lipid panel - COMPLETE METABOLIC PANEL WITH GFR - CBC with Differential/Platelet  3. Venous insufficiency of both lower extremities Unfortunately, she has been sitting with her legs dependent, eating more sodium than recommended, and ending time out in the heat.  This has caused a worsening of her lower extremity edema.  She does have compression socks that she usually wears which helps.  Reviewed continued conservative measures and use the lymphedema pump that she has at home.  4. Primary hyperparathyroidism (HCC) Checking PTH. - Parathyroid hormone, intact (no Ca)  5. Hypercalcemia Rechecking calcium levels. - COMPLETE METABOLIC PANEL WITH GFR  6. Lipid screening Checking lipid panel. - Lipid panel  7. Abnormal TSH Checking TSH. - TSH  8. Morbid obesity (HCC) Previously using Saxenda but did not see any results from this.  She is working to stay more active and is exercising regularly however has not seen much benefit to her weight.  Was shocked when she saw 300 pounds on the scale today and admits that she absolutely has to do something.  She does have a program through her insurance that helps her connect with a weight coach and she is planning to put this in place.  Checking hemoglobin A1c today.  With her weight gain, her snoring has worsened so getting home sleep study.  Referring to healthy weight and wellness. - Hemoglobin A1c - Amb Ref to Medical Weight Management - Home sleep test; Future  9. Loud snoring Notable for loud snoring and excessive daytime sleepiness in the setting of morbid obesity.  Home sleep study ordered today. - Home sleep test; Future  Return in about 6 months (around 05/31/2023) for chronic disease follow up.   Christen Butter, NP

## 2022-11-29 ENCOUNTER — Other Ambulatory Visit (HOSPITAL_COMMUNITY): Payer: Self-pay

## 2022-11-29 LAB — CBC WITH DIFFERENTIAL/PLATELET
Absolute Monocytes: 508 cells/uL (ref 200–950)
Basophils Absolute: 37 cells/uL (ref 0–200)
Basophils Relative: 0.6 %
Eosinophils Absolute: 291 cells/uL (ref 15–500)
Eosinophils Relative: 4.7 %
HCT: 38.7 % (ref 35.0–45.0)
Hemoglobin: 12.5 g/dL (ref 11.7–15.5)
Lymphs Abs: 1438 cells/uL (ref 850–3900)
MCH: 25 pg — ABNORMAL LOW (ref 27.0–33.0)
MCHC: 32.3 g/dL (ref 32.0–36.0)
MCV: 77.4 fL — ABNORMAL LOW (ref 80.0–100.0)
MPV: 10.7 fL (ref 7.5–12.5)
Monocytes Relative: 8.2 %
Neutro Abs: 3925 cells/uL (ref 1500–7800)
Neutrophils Relative %: 63.3 %
Platelets: 229 10*3/uL (ref 140–400)
RBC: 5 10*6/uL (ref 3.80–5.10)
RDW: 14.6 % (ref 11.0–15.0)
Total Lymphocyte: 23.2 %
WBC: 6.2 10*3/uL (ref 3.8–10.8)

## 2022-11-29 LAB — COMPLETE METABOLIC PANEL WITH GFR
AG Ratio: 1.4 (calc) (ref 1.0–2.5)
ALT: 18 U/L (ref 6–29)
AST: 15 U/L (ref 10–35)
Albumin: 3.9 g/dL (ref 3.6–5.1)
Alkaline phosphatase (APISO): 55 U/L (ref 37–153)
BUN: 18 mg/dL (ref 7–25)
CO2: 25 mmol/L (ref 20–32)
Calcium: 9 mg/dL (ref 8.6–10.4)
Chloride: 106 mmol/L (ref 98–110)
Creat: 0.87 mg/dL (ref 0.50–1.03)
Globulin: 2.8 g/dL (calc) (ref 1.9–3.7)
Glucose, Bld: 104 mg/dL — ABNORMAL HIGH (ref 65–99)
Potassium: 4.1 mmol/L (ref 3.5–5.3)
Sodium: 142 mmol/L (ref 135–146)
Total Bilirubin: 0.3 mg/dL (ref 0.2–1.2)
Total Protein: 6.7 g/dL (ref 6.1–8.1)
eGFR: 79 mL/min/{1.73_m2} (ref >=60)

## 2022-11-29 LAB — LIPID PANEL
Cholesterol: 176 mg/dL (ref <200)
HDL: 47 mg/dL — ABNORMAL LOW (ref >=50)
LDL Cholesterol (Calc): 111 mg/dL (calc) — ABNORMAL HIGH
Non-HDL Cholesterol (Calc): 129 mg/dL (calc) (ref <130)
Total CHOL/HDL Ratio: 3.7 (calc) (ref <5.0)
Triglycerides: 89 mg/dL (ref <150)

## 2022-11-29 LAB — TSH: TSH: 12.47 mIU/L — ABNORMAL HIGH

## 2022-11-29 LAB — HEMOGLOBIN A1C
Hgb A1c MFr Bld: 5.9 % of total Hgb — ABNORMAL HIGH (ref <5.7)
Mean Plasma Glucose: 123 mg/dL
eAG (mmol/L): 6.8 mmol/L

## 2022-11-29 LAB — PARATHYROID HORMONE, INTACT (NO CA): PTH: 47 pg/mL (ref 16–77)

## 2022-11-29 MED ORDER — LEVOTHYROXINE SODIUM 25 MCG PO TABS
25.0000 ug | ORAL_TABLET | Freq: Every day | ORAL | 0 refills | Status: DC
  Filled 2022-11-29: qty 90, 90d supply, fill #0

## 2022-11-29 NOTE — Addendum Note (Signed)
Addended byChristen Butter on: 11/29/2022 07:49 AM   Modules accepted: Orders

## 2022-12-03 ENCOUNTER — Other Ambulatory Visit (HOSPITAL_COMMUNITY): Payer: Self-pay

## 2022-12-04 ENCOUNTER — Encounter: Payer: BC Managed Care – PPO | Admitting: Bariatrics

## 2022-12-04 DIAGNOSIS — Z0289 Encounter for other administrative examinations: Secondary | ICD-10-CM

## 2022-12-06 ENCOUNTER — Ambulatory Visit: Payer: BC Managed Care – PPO | Admitting: Physical Therapy

## 2022-12-06 ENCOUNTER — Encounter: Payer: Self-pay | Admitting: Physical Therapy

## 2022-12-06 DIAGNOSIS — M6281 Muscle weakness (generalized): Secondary | ICD-10-CM | POA: Diagnosis not present

## 2022-12-06 DIAGNOSIS — R262 Difficulty in walking, not elsewhere classified: Secondary | ICD-10-CM

## 2022-12-06 DIAGNOSIS — G8929 Other chronic pain: Secondary | ICD-10-CM

## 2022-12-06 DIAGNOSIS — M25561 Pain in right knee: Secondary | ICD-10-CM | POA: Diagnosis not present

## 2022-12-06 DIAGNOSIS — M25562 Pain in left knee: Secondary | ICD-10-CM | POA: Diagnosis not present

## 2022-12-06 NOTE — Therapy (Signed)
OUTPATIENT PHYSICAL THERAPY LOWER EXTREMITY TREATMENT   Patient Name: Cynthia [name] MRN: [id number] DOB:05-07-1966, 57 y.o., female Today's Date: 12/06/2022  END OF SESSION:  PT End of Session - 12/06/22 0801     Visit Number 2    Number of Visits 16    Date for PT Re-Evaluation 01/23/23    Authorization Type BCBS    PT Start Time 0801    PT Stop Time 0840    PT Time Calculation (min) 39 min    Activity Tolerance Patient tolerated treatment well    Behavior During Therapy [facility] for tasks assessed/performed              Past Medical History:  Diagnosis Date   Anemia    as a child   Asthma    Chronic venous insufficiency 09/29/2017   Deep venous thrombosis of lower extremity (HCC) 11/17/2015   due to [facility] per pt   Depression    GERD (gastroesophageal reflux disease)    History of DVT (deep vein thrombosis)    History of kidney stones    Primary hyperparathyroidism (HCC) 11/28/2017   Sleep apnea    mild no mask   Sleep difficulties    Thyroid disease    Past Surgical History:  Procedure Laterality Date   CHOLECYSTECTOMY     COLONOSCOPY  2014   GASTRIC RESTRICTION SURGERY     lap band   HERNIA REPAIR     PARATHYROIDECTOMY Right 04/28/2020   Procedure: RIGHT  PARATHYROIDECTOMY;  Surgeon: Darnell Level, MD;  Location: [facility];  Service: General;  Laterality: Right;   THYROID LOBECTOMY Right 04/28/2020   Procedure: RIGHT THYROID LOBECTOMY;  Surgeon: Darnell Level, MD;  Location: [facility];  Service: General;  Laterality: Right;   Patient Active Problem List   Diagnosis Date Noted   Abdominal pain 10/25/2022   Family history of malignant neoplasm of gastrointestinal tract 10/25/2022   Polycystic ovaries 10/25/2022   Primary hyperparathyroidism (HCC) 04/28/2020   Right thyroid nodule 02/28/2020   Depressive disorder 03/18/2019   Venous insufficiency of both lower extremities 11/11/2018   Class 3 severe obesity due to excess calories with serious comorbidity and body  mass index (BMI) of 45.0 to 49.9 in adult (HCC) 11/11/2018   Shortness of breath 10/23/2018   Chronic cough 10/23/2018   Bradycardia with 51-60 beats per minute 10/09/2018   Laryngospasms 10/09/2018   Dyspepsia 10/09/2018   Bronchospasm, acute 05/27/2018   Microcytic anemia 11/13/2017   Hypercalcemia 11/13/2017   Sacroiliac joint dysfunction of both sides 10/21/2017   Lumbar degenerative disc disease 10/21/2017   Primary osteoarthritis of left knee 09/19/2017   Peripheral edema 09/19/2017   Morbid obesity with BMI of 40.0-44.9, adult (HCC) 09/18/2017   Urinary incontinence 09/18/2017   History of DVT (deep vein thrombosis)    LGSIL on Pap smear of cervix 11/22/2015   Migraine 11/17/2015   Vitamin D deficiency 11/17/2015   Lapband APL + HH repair (1 suture) 01/28/2012    PCP: Christen Butter  REFERRING PROVIDER: Christen Butter  REFERRING DIAG: Chronic pain of bilateral knees  THERAPY DIAG:  Chronic pain of both knees  Muscle weakness (generalized)  Difficulty in walking, not elsewhere classified  Rationale for Evaluation and Treatment: Rehabilitation  ONSET DATE: 08/31/22  SUBJECTIVE:   SUBJECTIVE STATEMENT: "I'm okay." Pt states her knees are always kind of stiff when she first wakes up. Feels more of an ache than a pain. Pt states she has not been able to do  her exercises.   From eval: Pt had a fall while going down stairs and caught her foot on her long dress, she tried to catch herself from falling and her Rt knee got twisted. Since that time she states she feels like "I walk like I'm old" and she has a continuous ache which is getting worse. She states pain increases with turning Rt and pivoting on her knee. She states if she is "up and around for 2-3 hours" her knee will be sore. Pain decreases with meds.  PERTINENT HISTORY: lymphodema PAIN:  Are you having pain? Yes: NPRS scale: 3 on L; 6 or 7 on R/10 currently/10 Pain location: knee Pain description: sharp,  sore Aggravating factors: twisting Rt knee, turning Relieving factors: meds  PRECAUTIONS: None  WEIGHT BEARING RESTRICTIONS: No  FALLS:  Has patient fallen in last 6 months? Yes. 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 Mild chondrocalcinosis  EDEMA:  Pt has lymphodema Lt > Rt LE  PALPATION: Patella mobility WFL  LOWER EXTREMITY ROM:  Active ROM Right eval Left eval  Hip flexion    Hip extension    Hip abduction    Hip adduction    Hip internal rotation    Hip external rotation    Knee flexion 91 115  Knee extension 0 0  Ankle dorsiflexion    Ankle plantarflexion    Ankle inversion    Ankle eversion     (Blank rows = not tested)  LOWER EXTREMITY MMT:  MMT Right eval Left eval  Hip flexion 4- Hip flex with ER 3 4 Hip flex with ER 3+  Hip extension    Hip abduction 3 3+  Hip adduction    Hip internal rotation    Hip external rotation    Knee flexion 4- 4  Knee extension 4- 4  Ankle dorsiflexion    Ankle plantarflexion    Ankle inversion    Ankle eversion     (Blank rows = not tested)   FUNCTIONAL TESTS:  SLS: Rt unable, Lt 5 sec  GAIT: Distance walked: 100' Assistive device utilized: None Level of assistance: Complete Independence Comments: mild antalgic gait, decreased stance on Rt Stairs: step to pattern with use of railings to descend   TODAY'S TREATMENT:  [facility] Adult PT Treatment:                                                DATE: 12/06/22 Therapeutic Exercise: Nustep L5 x 5 min Supine Hamstring stretch with strap x30 sec B Heel slide with strap 10 x3 sec on R Hip flexor/quad stretch with strap x30 sec on R SLR + ER 3x10 Bridge with red TB around knees 3x10 Sitting LAQ red TB  3x10 Hamstring curl red TB 3x10 Standing Hip abduction red TB 3x10 Hip extension red TB 3x10 Wall squat 3x20 sec Butt kick x 1 lap Walking for cool down  DATE: 11/28/22 See HEP    PATIENT EDUCATION:  Education details: PT POC and goals, HEP Person educated: Patient Education method: Explanation, Demonstration, and Handouts Education comprehension: verbalized understanding and returned demonstration  HOME EXERCISE PROGRAM: Access Code: [email or web address] URL: [email or web address] Date: 11/28/2022 Prepared by: Reggy Eye  Exercises - Hip Abduction with Resistance Loop  - 1 x daily - 7 x weekly - 3 sets - 10 reps - Hip Extension with Resistance Loop  - 1 x daily - 7 x weekly - 3 sets - 10 reps - Seated Hamstring Curl with Anchored Resistance  - 1 x daily - 7 x weekly - 3 sets - 10 reps - Seated Knee Flexion Stretch  - 1 x daily - 7 x weekly - 1 sets - 10 reps - 10 seconds hold - Straight Leg Raise with External Rotation  - 1 x daily - 7 x weekly - 3 sets - 10 reps  ASSESSMENT:  CLINICAL IMPRESSION: Treatment focused on improving pt's knee mobility and reviewing HEP. Worked on knee and hip strengthening with good pt tolerance.   From eval: Patient is a 57 y.o. female who was seen today for physical therapy evaluation and treatment for chronic pain of bilateral knees. Pt presents with decreased strength and ROM, impaired gait and balance and will benefit from skilled PT to address deficits and improve functional activity tolerance.   OBJECTIVE IMPAIRMENTS: decreased activity tolerance, difficulty walking, decreased ROM, decreased strength, and pain.     GOALS: Goals reviewed with patient? Yes  SHORT TERM GOALS: Target date: 12/26/2022   Pt will be independent in initial HEP Baseline: Goal status: INITIAL  2.  Pt will improve Rt knee flexion  ROM to 100 to improve tolerance to stair negotiation Baseline:  Goal status: INITIAL    LONG TERM GOALS: Target date: 01/23/2023    Pt will be independent with advanced HEP Baseline:  Goal status: INITIAL  2.  Pt will improve knee strength bilat to 4+/5 to improve standing and walking tolerance Baseline:  Goal status: INITIAL  3.  Pt will be able to negotiation stairs with reciprocal pattern with 1 handrail with pain <= 2/10 Baseline:  Goal status: INITIAL  4.  Pt will demo improved balance by performing SLS >= 15 sec bilat Baseline:  Goal status: INITIAL    PLAN:  PT FREQUENCY: 1-2x/week  PT DURATION: 8 weeks  PLANNED INTERVENTIONS: Therapeutic exercises, Therapeutic activity, Neuromuscular re-education, Balance training, Gait training, Patient/Family education, Self Care, Joint mobilization, Aquatic Therapy, Dry Needling, Electrical stimulation, Cryotherapy, Moist heat, Taping, Vasopneumatic device, Ultrasound, Ionotophoresis 4mg /ml Dexamethasone, Manual therapy, and Re-evaluation  PLAN FOR NEXT SESSION: assess response to HEP, knee strength and stability   Zedrick Springsteen April Ma L Khalise Billard, PT 12/06/2022, 8:01 AM

## 2022-12-07 ENCOUNTER — Ambulatory Visit: Payer: BC Managed Care – PPO | Admitting: Physical Therapy

## 2022-12-07 ENCOUNTER — Other Ambulatory Visit (HOSPITAL_COMMUNITY): Payer: Self-pay

## 2022-12-07 DIAGNOSIS — Z139 Encounter for screening, unspecified: Secondary | ICD-10-CM | POA: Diagnosis not present

## 2022-12-07 DIAGNOSIS — Z01419 Encounter for gynecological examination (general) (routine) without abnormal findings: Secondary | ICD-10-CM | POA: Diagnosis not present

## 2022-12-07 DIAGNOSIS — Z1231 Encounter for screening mammogram for malignant neoplasm of breast: Secondary | ICD-10-CM | POA: Diagnosis not present

## 2022-12-07 DIAGNOSIS — Z124 Encounter for screening for malignant neoplasm of cervix: Secondary | ICD-10-CM | POA: Diagnosis not present

## 2022-12-07 DIAGNOSIS — R32 Unspecified urinary incontinence: Secondary | ICD-10-CM | POA: Diagnosis not present

## 2022-12-07 MED ORDER — NITROFURANTOIN MONOHYD MACRO 100 MG PO CAPS
100.0000 mg | ORAL_CAPSULE | Freq: Two times a day (BID) | ORAL | 0 refills | Status: AC
  Filled 2022-12-07: qty 10, 5d supply, fill #0

## 2022-12-10 ENCOUNTER — Ambulatory Visit: Payer: BC Managed Care – PPO | Admitting: Physical Therapy

## 2022-12-10 ENCOUNTER — Encounter: Payer: Self-pay | Admitting: Physical Therapy

## 2022-12-10 DIAGNOSIS — M25562 Pain in left knee: Secondary | ICD-10-CM | POA: Diagnosis not present

## 2022-12-10 DIAGNOSIS — G8929 Other chronic pain: Secondary | ICD-10-CM

## 2022-12-10 DIAGNOSIS — M6281 Muscle weakness (generalized): Secondary | ICD-10-CM | POA: Diagnosis not present

## 2022-12-10 DIAGNOSIS — M25561 Pain in right knee: Secondary | ICD-10-CM | POA: Diagnosis not present

## 2022-12-10 DIAGNOSIS — R262 Difficulty in walking, not elsewhere classified: Secondary | ICD-10-CM

## 2022-12-10 NOTE — Therapy (Signed)
OUTPATIENT PHYSICAL THERAPY LOWER EXTREMITY TREATMENT   Patient Name: Cynthia [name] MRN: [id number] DOB:09-Jun-1965, 57 y.o., female Today's Date: 12/10/2022  END OF SESSION:  PT End of Session - 12/10/22 0924     Visit Number 3    Number of Visits 16    Date for PT Re-Evaluation 01/23/23    Authorization Type BCBS    PT Start Time 0845    PT Stop Time 0924    PT Time Calculation (min) 39 min    Activity Tolerance Patient tolerated treatment well    Behavior During Therapy [facility] for tasks assessed/performed               Past Medical History:  Diagnosis Date   Anemia    as a child   Asthma    Chronic venous insufficiency 09/29/2017   Deep venous thrombosis of lower extremity (HCC) 11/17/2015   due to [facility] per pt   Depression    GERD (gastroesophageal reflux disease)    History of DVT (deep vein thrombosis)    History of kidney stones    Primary hyperparathyroidism (HCC) 11/28/2017   Sleep apnea    mild no mask   Sleep difficulties    Thyroid disease    Past Surgical History:  Procedure Laterality Date   CHOLECYSTECTOMY     COLONOSCOPY  2014   GASTRIC RESTRICTION SURGERY     lap band   HERNIA REPAIR     PARATHYROIDECTOMY Right 04/28/2020   Procedure: RIGHT  PARATHYROIDECTOMY;  Surgeon: Darnell Level, MD;  Location: [facility];  Service: General;  Laterality: Right;   THYROID LOBECTOMY Right 04/28/2020   Procedure: RIGHT THYROID LOBECTOMY;  Surgeon: Darnell Level, MD;  Location: [facility];  Service: General;  Laterality: Right;   Patient Active Problem List   Diagnosis Date Noted   Abdominal pain 10/25/2022   Family history of malignant neoplasm of gastrointestinal tract 10/25/2022   Polycystic ovaries 10/25/2022   Primary hyperparathyroidism (HCC) 04/28/2020   Right thyroid nodule 02/28/2020   Depressive disorder 03/18/2019   Venous insufficiency of both lower extremities 11/11/2018   Class 3 severe obesity due to excess calories with serious comorbidity and  body mass index (BMI) of 45.0 to 49.9 in adult (HCC) 11/11/2018   Shortness of breath 10/23/2018   Chronic cough 10/23/2018   Bradycardia with 51-60 beats per minute 10/09/2018   Laryngospasms 10/09/2018   Dyspepsia 10/09/2018   Bronchospasm, acute 05/27/2018   Microcytic anemia 11/13/2017   Hypercalcemia 11/13/2017   Sacroiliac joint dysfunction of both sides 10/21/2017   Lumbar degenerative disc disease 10/21/2017   Primary osteoarthritis of left knee 09/19/2017   Peripheral edema 09/19/2017   Morbid obesity with BMI of 40.0-44.9, adult (HCC) 09/18/2017   Urinary incontinence 09/18/2017   History of DVT (deep vein thrombosis)    LGSIL on Pap smear of cervix 11/22/2015   Migraine 11/17/2015   Vitamin D deficiency 11/17/2015   Lapband APL + HH repair (1 suture) 01/28/2012    PCP: Christen Butter  REFERRING PROVIDER: Christen Butter  REFERRING DIAG: Chronic pain of bilateral knees  THERAPY DIAG:  Chronic pain of both knees  Muscle weakness (generalized)  Difficulty in walking, not elsewhere classified  Rationale for Evaluation and Treatment: Rehabilitation  ONSET DATE: 08/31/22  SUBJECTIVE:   SUBJECTIVE STATEMENT: Pt states she feels a little sore due to the rainy weather today  From eval: Pt had a fall while going down stairs and caught her foot on her long dress,  she tried to catch herself from falling and her Rt knee got twisted. Since that time she states she feels like "I walk like I'm old" and she has a continuous ache which is getting worse. She states pain increases with turning Rt and pivoting on her knee. She states if she is "up and around for 2-3 hours" her knee will be sore. Pain decreases with meds.  PERTINENT HISTORY: lymphodema PAIN:  Are you having pain? Yes: NPRS scale: 5/10 bilat/10 Pain location: knee Pain description: sharp, sore Aggravating factors: twisting Rt knee, turning Relieving factors: meds  PRECAUTIONS: None  WEIGHT BEARING RESTRICTIONS:  No  FALLS:  Has patient fallen in last 6 months? Yes. 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 Mild chondrocalcinosis  EDEMA:  Pt has lymphodema Lt > Rt LE  PALPATION: Patella mobility WFL  LOWER EXTREMITY ROM:  Active ROM Right eval Left eval  Hip flexion    Hip extension    Hip abduction    Hip adduction    Hip internal rotation    Hip external rotation    Knee flexion 91 115  Knee extension 0 0  Ankle dorsiflexion    Ankle plantarflexion    Ankle inversion    Ankle eversion     (Blank rows = not tested)  LOWER EXTREMITY MMT:  MMT Right eval Left eval  Hip flexion 4- Hip flex with ER 3 4 Hip flex with ER 3+  Hip extension    Hip abduction 3 3+  Hip adduction    Hip internal rotation    Hip external rotation    Knee flexion 4- 4  Knee extension 4- 4  Ankle dorsiflexion    Ankle plantarflexion    Ankle inversion    Ankle eversion     (Blank rows = not tested)   FUNCTIONAL TESTS:  SLS: Rt unable, Lt 5 sec  GAIT: Distance walked: 100' Assistive device utilized: None Level of assistance: Complete Independence Comments: mild antalgic gait, decreased stance on Rt Stairs: step to pattern with use of railings to descend   TODAY'S TREATMENT:  [facility] Adult PT Treatment:                                                DATE: 12/10/22 Therapeutic Exercise: Nustep L6 x 5 min for warm up Standing hip abd red TB 3 x 10 Standing hip ext red TB 3 x 10 Eccentric lateral step downs 4'' step x 15 bilat Wall squat 3 x 20 sec Seated LAQ 2 x 10 green TB Seated HS curl 2 x 10 green TB Supine SLR with ER 2 x 10 bilat Bridge with ball squeeze 2 x 10 HS stretch with strap 2 x 30 sec Rt knee flexion stretch 2 x 30  sec Butt kicks Walking for cool down   [facility] Adult PT Treatment:  DATE: 12/06/22 Therapeutic Exercise: Nustep L5 x 5 min Supine Hamstring stretch with strap x30 sec B Heel slide with strap 10 x3 sec on R Hip flexor/quad stretch with strap x30 sec on R SLR + ER 3x10 Bridge with red TB around knees 3x10 Sitting LAQ red TB 3x10 Hamstring curl red TB 3x10 Standing Hip abduction red TB 3x10 Hip extension red TB 3x10 Wall squat 3x20 sec Butt kick x 1 lap Walking for cool down    PATIENT EDUCATION:  Education details: PT POC and goals, HEP Person educated: Patient Education method: Programmer, multimedia, Facilities manager, and Handouts Education comprehension: verbalized understanding and returned demonstration  HOME EXERCISE PROGRAM: Access Code: [email or web address] URL: [email or web address] Date: 11/28/2022 Prepared by: Reggy Eye  Exercises - Hip Abduction with Resistance Loop  - 1 x daily - 7 x weekly - 3 sets - 10 reps - Hip Extension with Resistance Loop  - 1 x daily - 7 x weekly - 3 sets - 10 reps - Seated Hamstring Curl with Anchored Resistance  - 1 x daily - 7 x weekly - 3 sets - 10 reps - Seated Knee Flexion Stretch  - 1 x daily - 7 x weekly - 1 sets - 10 reps - 10 seconds hold - Straight Leg Raise with External Rotation  - 1 x daily - 7 x weekly - 3 sets - 10 reps  ASSESSMENT:  CLINICAL IMPRESSION: Pt with good tolerance to lateral step downs. Discussed various ways to improve knee flexion in supine, seated and standing. Pt with good response to all exercises today  OBJECTIVE IMPAIRMENTS: decreased activity tolerance, difficulty walking, decreased ROM, decreased strength, and pain.     GOALS: Goals reviewed with patient? Yes  SHORT TERM GOALS: Target date: 12/26/2022   Pt will be independent in initial HEP Baseline: Goal status: INITIAL  2.  Pt will improve Rt knee flexion ROM to 100 to improve tolerance to stair  negotiation Baseline:  Goal status: INITIAL    LONG TERM GOALS: Target date: 01/23/2023    Pt will be independent with advanced HEP Baseline:  Goal status: INITIAL  2.  Pt will improve knee strength bilat to 4+/5 to improve standing and walking tolerance Baseline:  Goal status: INITIAL  3.  Pt will be able to negotiation stairs with reciprocal pattern with 1 handrail with pain <= 2/10 Baseline:  Goal status: INITIAL  4.  Pt will demo improved balance by performing SLS >= 15 sec bilat Baseline:  Goal status: INITIAL    PLAN:  PT FREQUENCY: 1-2x/week  PT DURATION: 8 weeks  PLANNED INTERVENTIONS: Therapeutic exercises, Therapeutic activity, Neuromuscular re-education, Balance training, Gait training, Patient/Family education, Self Care, Joint mobilization, Aquatic Therapy, Dry Needling, Electrical stimulation, Cryotherapy, Moist heat, Taping, Vasopneumatic device, Ultrasound, Ionotophoresis 4mg /ml Dexamethasone, Manual therapy, and Re-evaluation  PLAN FOR NEXT SESSION: update HEP, knee strength and stability   Serafino Burciaga, PT 12/10/2022, 9:25 AM

## 2022-12-11 ENCOUNTER — Ambulatory Visit: Payer: BC Managed Care – PPO | Admitting: Physical Therapy

## 2022-12-11 ENCOUNTER — Other Ambulatory Visit (HOSPITAL_COMMUNITY): Payer: Self-pay

## 2022-12-11 ENCOUNTER — Encounter: Payer: Self-pay | Admitting: Physical Therapy

## 2022-12-11 DIAGNOSIS — M25562 Pain in left knee: Secondary | ICD-10-CM | POA: Diagnosis not present

## 2022-12-11 DIAGNOSIS — G8929 Other chronic pain: Secondary | ICD-10-CM | POA: Diagnosis not present

## 2022-12-11 DIAGNOSIS — M6281 Muscle weakness (generalized): Secondary | ICD-10-CM

## 2022-12-11 DIAGNOSIS — R262 Difficulty in walking, not elsewhere classified: Secondary | ICD-10-CM

## 2022-12-11 DIAGNOSIS — M25561 Pain in right knee: Secondary | ICD-10-CM | POA: Diagnosis not present

## 2022-12-11 NOTE — Therapy (Addendum)
OUTPATIENT PHYSICAL THERAPY LOWER EXTREMITY TREATMENT AND DISCHARGE   Patient Name: Cynthia [name] MRN: [id number] DOB:1966/02/21, 57 y.o., female Today's Date: 12/11/2022  END OF SESSION:  PT End of Session - 12/11/22 0926     Visit Number 4    Number of Visits 16    Date for PT Re-Evaluation 01/23/23    Authorization Type BCBS    PT Start Time 0845    PT Stop Time 0923    PT Time Calculation (min) 38 min    Activity Tolerance Patient tolerated treatment well    Behavior During Therapy [facility] for tasks assessed/performed                Past Medical History:  Diagnosis Date   Anemia    as a child   Asthma    Chronic venous insufficiency 09/29/2017   Deep venous thrombosis of lower extremity (HCC) 11/17/2015   due to [facility] per pt   Depression    GERD (gastroesophageal reflux disease)    History of DVT (deep vein thrombosis)    History of kidney stones    Primary hyperparathyroidism (HCC) 11/28/2017   Sleep apnea    mild no mask   Sleep difficulties    Thyroid disease    Past Surgical History:  Procedure Laterality Date   CHOLECYSTECTOMY     COLONOSCOPY  2014   GASTRIC RESTRICTION SURGERY     lap band   HERNIA REPAIR     PARATHYROIDECTOMY Right 04/28/2020   Procedure: RIGHT  PARATHYROIDECTOMY;  Surgeon: Darnell Level, MD;  Location: [facility];  Service: General;  Laterality: Right;   THYROID LOBECTOMY Right 04/28/2020   Procedure: RIGHT THYROID LOBECTOMY;  Surgeon: Darnell Level, MD;  Location: [facility];  Service: General;  Laterality: Right;   Patient Active Problem List   Diagnosis Date Noted   Abdominal pain 10/25/2022   Family history of malignant neoplasm of gastrointestinal tract 10/25/2022   Polycystic ovaries 10/25/2022   Primary hyperparathyroidism (HCC) 04/28/2020   Right thyroid nodule 02/28/2020   Depressive disorder 03/18/2019   Venous insufficiency of both lower extremities 11/11/2018   Class 3 severe obesity due to excess calories with serious  comorbidity and body mass index (BMI) of 45.0 to 49.9 in adult (HCC) 11/11/2018   Shortness of breath 10/23/2018   Chronic cough 10/23/2018   Bradycardia with 51-60 beats per minute 10/09/2018   Laryngospasms 10/09/2018   Dyspepsia 10/09/2018   Bronchospasm, acute 05/27/2018   Microcytic anemia 11/13/2017   Hypercalcemia 11/13/2017   Sacroiliac joint dysfunction of both sides 10/21/2017   Lumbar degenerative disc disease 10/21/2017   Primary osteoarthritis of left knee 09/19/2017   Peripheral edema 09/19/2017   Morbid obesity with BMI of 40.0-44.9, adult (HCC) 09/18/2017   Urinary incontinence 09/18/2017   History of DVT (deep vein thrombosis)    LGSIL on Pap smear of cervix 11/22/2015   Migraine 11/17/2015   Vitamin D deficiency 11/17/2015   Lapband APL + HH repair (1 suture) 01/28/2012    PCP: Christen Butter  REFERRING PROVIDER: Christen Butter  REFERRING DIAG: Chronic pain of bilateral knees  THERAPY DIAG:  Chronic pain of both knees  Muscle weakness (generalized)  Difficulty in walking, not elsewhere classified  Rationale for Evaluation and Treatment: Rehabilitation  ONSET DATE: 08/31/22  SUBJECTIVE:   SUBJECTIVE STATEMENT: Pt states she feels pretty good today. She has been working on redoing her bathroom and said her knee gets "achy" but that it doesn't hurt as much  From eval: Pt had a fall while going down stairs and caught her foot on her long dress, she tried to catch herself from falling and her Rt knee got twisted. Since that time she states she feels like "I walk like I'm old" and she has a continuous ache which is getting worse. She states pain increases with turning Rt and pivoting on her knee. She states if she is "up and around for 2-3 hours" her knee will be sore. Pain decreases with meds.  PERTINENT HISTORY: lymphodema PAIN:  Are you having pain? Yes: NPRS scale: 5/10 bilat/10 Pain location: knee Pain description: sharp, sore Aggravating factors:  twisting Rt knee, turning Relieving factors: meds  PRECAUTIONS: None  WEIGHT BEARING RESTRICTIONS: No  FALLS:  Has patient fallen in last 6 months? Yes. 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 Mild chondrocalcinosis  EDEMA:  Pt has lymphodema Lt > Rt LE  PALPATION: Patella mobility WFL  LOWER EXTREMITY ROM:  Active ROM Right eval Left eval Right 7/23  Hip flexion     Hip extension     Hip abduction     Hip adduction     Hip internal rotation     Hip external rotation     Knee flexion 91 115 119  Knee extension 0 0   Ankle dorsiflexion     Ankle plantarflexion     Ankle inversion     Ankle eversion      (Blank rows = not tested)  LOWER EXTREMITY MMT:  MMT Right eval Left eval  Hip flexion 4- Hip flex with ER 3 4 Hip flex with ER 3+  Hip extension    Hip abduction 3 3+  Hip adduction    Hip internal rotation    Hip external rotation    Knee flexion 4- 4  Knee extension 4- 4  Ankle dorsiflexion    Ankle plantarflexion    Ankle inversion    Ankle eversion     (Blank rows = not tested)   FUNCTIONAL TESTS:  SLS: Rt unable, Lt 5 sec  GAIT: Distance walked: 100' Assistive device utilized: None Level of assistance: Complete Independence Comments: mild antalgic gait, decreased stance on Rt Stairs: step to pattern with use of railings to descend   TODAY'S TREATMENT:  [facility] Adult PT Treatment:                                                DATE: 12/11/22 Therapeutic Exercise: Nustep L6 x 5 min for warm up Knee flexion stretch on step Hip 3 way on slider x 10 bilat Rocker board static hold laterally x 1 min Resisted walking 15# backward x 10 Resisted walking laterally 10# x 5 bilat Sidelying Rt hip  abd 2 x 10 Sidelying Rt clam x 20 Bridge x 20 Leg press 115# 2 x 15   [facility] Adult PT Treatment:  DATE: 12/10/22 Therapeutic Exercise: Nustep L6 x 5 min for warm up Standing hip abd red TB 3 x 10 Standing hip ext red TB 3 x 10 Eccentric lateral step downs 4'' step x 15 bilat Wall squat 3 x 20 sec Seated LAQ 2 x 10 green TB Seated HS curl 2 x 10 green TB Supine SLR with ER 2 x 10 bilat Bridge with ball squeeze 2 x 10 HS stretch with strap 2 x 30 sec Rt knee flexion stretch 2 x 30 sec Butt kicks Walking for cool down   [facility] Adult PT Treatment:                                                DATE: 12/06/22 Therapeutic Exercise: Nustep L5 x 5 min Supine Hamstring stretch with strap x30 sec B Heel slide with strap 10 x3 sec on R Hip flexor/quad stretch with strap x30 sec on R SLR + ER 3x10 Bridge with red TB around knees 3x10 Sitting LAQ red TB 3x10 Hamstring curl red TB 3x10 Standing Hip abduction red TB 3x10 Hip extension red TB 3x10 Wall squat 3x20 sec Butt kick x 1 lap Walking for cool down    PATIENT EDUCATION:  Education details: PT POC and goals, HEP Person educated: Patient Education method: Programmer, multimedia, Facilities manager, and Handouts Education comprehension: verbalized understanding and returned demonstration  HOME EXERCISE PROGRAM: Access Code: [email or web address] URL: [email or web address] Date: 11/28/2022 Prepared by: Reggy Eye  Exercises - Hip Abduction with Resistance Loop  - 1 x daily - 7 x weekly - 3 sets - 10 reps - Hip Extension with Resistance Loop  - 1 x daily - 7 x weekly - 3 sets - 10 reps - Seated Hamstring Curl with Anchored Resistance  - 1 x daily - 7 x weekly - 3 sets - 10 reps - Seated Knee Flexion Stretch  - 1 x daily - 7 x weekly - 1 sets - 10 reps - 10 seconds hold - Straight Leg Raise with External Rotation  - 1 x daily - 7 x weekly - 3 sets - 10 reps  ASSESSMENT:  CLINICAL  IMPRESSION: Focused on medial/lateral stability and Rt hip strength today. Added hip abd/add on slider to HEP. Pt fatigues quickly with sidelying hip exercises and requires frequent rest breaks. Good tolerance to addition of leg press today. Much improved Rt knee flexion ROM (see above)  OBJECTIVE IMPAIRMENTS: decreased activity tolerance, difficulty walking, decreased ROM, decreased strength, and pain.     GOALS: Goals reviewed with patient? Yes  SHORT TERM GOALS: Target date: 12/26/2022   Pt will be independent in initial HEP Baseline: Goal status: INITIAL  2.  Pt will improve Rt knee flexion ROM to 100 to improve tolerance to stair negotiation Baseline:  Goal status: MET    LONG TERM GOALS: Target date: 01/23/2023    Pt will be independent with advanced HEP Baseline:  Goal status: INITIAL  2.  Pt will improve knee strength bilat to 4+/5 to improve standing and walking tolerance Baseline:  Goal status: INITIAL  3.  Pt will be able to negotiation stairs with reciprocal pattern with 1 handrail with pain <= 2/10 Baseline:  Goal status: INITIAL  4.  Pt will demo improved balance by performing SLS >= 15 sec bilat Baseline:  Goal status: INITIAL    PLAN:  PT FREQUENCY: 1-2x/week  PT DURATION: 8 weeks  PLANNED INTERVENTIONS: Therapeutic exercises, Therapeutic activity, Neuromuscular re-education, Balance training, Gait training, Patient/Family education, Self Care, Joint mobilization, Aquatic Therapy, Dry Needling, Electrical stimulation, Cryotherapy, Moist heat, Taping, Vasopneumatic device, Ultrasound, Ionotophoresis 4mg /ml Dexamethasone, Manual therapy, and Re-evaluation  PLAN FOR NEXT SESSION: update HEP, knee and hip strength and stability, balance  PHYSICAL THERAPY DISCHARGE SUMMARY  Visits from Start of Care: 4  Current functional level related to goals / functional outcomes: Improved strength and ROM   Remaining deficits: See above   Education /  Equipment: HEP   Patient agrees to discharge. Patient goals were partially met. Patient is being discharged due to not returning since the last visit. Reggy Eye, PT,DPT02/14/2510:03 AM   Reggy Eye, PT 12/11/2022, 9:26 AM

## 2022-12-13 ENCOUNTER — Other Ambulatory Visit (HOSPITAL_COMMUNITY): Payer: Self-pay

## 2022-12-13 MED ORDER — CIPROFLOXACIN HCL 500 MG PO TABS
250.0000 mg | ORAL_TABLET | Freq: Two times a day (BID) | ORAL | 0 refills | Status: DC
  Filled 2022-12-13: qty 3, 3d supply, fill #0

## 2022-12-20 DIAGNOSIS — R7309 Other abnormal glucose: Secondary | ICD-10-CM | POA: Diagnosis not present

## 2022-12-24 ENCOUNTER — Ambulatory Visit: Payer: BC Managed Care – PPO | Admitting: Bariatrics

## 2022-12-24 ENCOUNTER — Encounter: Payer: Self-pay | Admitting: Bariatrics

## 2022-12-24 VITALS — BP 133/82 | HR 59 | Temp 98.0°F | Ht 65.5 in | Wt 299.0 lb

## 2022-12-24 DIAGNOSIS — Z0000 Encounter for general adult medical examination without abnormal findings: Secondary | ICD-10-CM | POA: Insufficient documentation

## 2022-12-24 DIAGNOSIS — R0602 Shortness of breath: Secondary | ICD-10-CM | POA: Diagnosis not present

## 2022-12-24 DIAGNOSIS — E559 Vitamin D deficiency, unspecified: Secondary | ICD-10-CM

## 2022-12-24 DIAGNOSIS — E282 Polycystic ovarian syndrome: Secondary | ICD-10-CM | POA: Diagnosis not present

## 2022-12-24 DIAGNOSIS — R7303 Prediabetes: Secondary | ICD-10-CM | POA: Insufficient documentation

## 2022-12-24 DIAGNOSIS — Z6841 Body Mass Index (BMI) 40.0 and over, adult: Secondary | ICD-10-CM

## 2022-12-24 DIAGNOSIS — Z1331 Encounter for screening for depression: Secondary | ICD-10-CM

## 2022-12-24 DIAGNOSIS — E038 Other specified hypothyroidism: Secondary | ICD-10-CM | POA: Insufficient documentation

## 2022-12-24 DIAGNOSIS — R5383 Other fatigue: Secondary | ICD-10-CM | POA: Diagnosis not present

## 2022-12-24 DIAGNOSIS — E538 Deficiency of other specified B group vitamins: Secondary | ICD-10-CM | POA: Diagnosis not present

## 2022-12-25 NOTE — Progress Notes (Signed)
Chief Complaint:   OBESITY Cynthia [name] (MR# [id number]) is a 57 y.o. female who presents for evaluation and treatment of obesity and related comorbidities. Current BMI is Body mass index is 49 kg/m. Cynthia [name] has been struggling with her weight for many years and has been unsuccessful in either losing weight, maintaining weight loss, or reaching her healthy weight goal.  Cynthia [name] is currently in the action stage of change and ready to dedicate time achieving and maintaining a healthier weight. Cynthia [name] is interested in becoming our patient and working on intensive lifestyle modifications including (but not limited to) diet and exercise for weight loss.  Patient is here today for her first initial visit.   Cynthia [name]'s habits were reviewed today and are as follows: Her family eats meals together, she thinks her family will eat healthier with her, her desired weight loss is 64 lbs, she has been heavy most of her life, she started gaining weight at 57 years old, her heaviest weight ever was 380 pounds, she has significant food cravings issues, she snacks frequently in the evenings, she wakes up frequently in the middle of the night to eat, she skips meals frequently, she is frequently drinking liquids with calories, she frequently makes poor food choices, she has problems with excessive hunger, she frequently eats larger portions than normal, and she struggles with emotional eating.  Depression Screen Cynthia [name]'s Food and Mood (modified PHQ-9) score was 13.  Subjective:   1. Other fatigue Cynthia [name] admits to daytime somnolence and admits to waking up still tired. Patient has a history of symptoms of daytime fatigue, morning fatigue, and morning headache. Cynthia [name] generally gets 4 or 6 hours of sleep per night, and states that she has nightime awakenings. Snoring is present. Apneic episodes are present. Epworth Sleepiness Score is 14.   2. SOB (shortness of breath) on exertion Cynthia [name] notes  increasing shortness of breath with exercising and seems to be worsening over time with weight gain. She notes getting out of breath sooner with activity than she used to. This has not gotten worse recently. Cynthia [name] denies shortness of breath at rest or orthopnea.  3. PCOS (polycystic ovarian syndrome) Patient has a history of pre-diabetes. She is not on medications.   4. Vitamin D deficiency Patient is currently taking Vitamin D.   5. Other specified hypothyroidism Patient has a history of parathyroid surgery. She has been taking Synthroid for two and a half weeks.   6. B12 deficiency Patient is currently taking B12.  7. Prediabetes Patient has a new diagnosis as of July 2024. Recent A1c was 5.9. She has a maternal family history of diabetes mellitus with her great grandmother.   8. Health care maintenance Given obesity.   Assessment/Plan:   1. Other fatigue [name] does feel that her weight is causing her energy to be lower than it should be. Fatigue may be related to obesity, depression or many other causes. Labs will be ordered, and in the meanwhile, Cynthia [name] will focus on self care including making healthy food choices, increasing physical activity and focusing on stress reduction.  - EKG 12-Lead  2. SOB (shortness of breath) on exertion Cynthia [name] does feel that she gets out of breath more easily that she used to when she exercises. Cynthia [name]'s shortness of breath appears to be obesity related and exercise induced. She has agreed to work on weight loss and gradually increase exercise to treat her exercise induced shortness of breath. Will continue to monitor closely.  3. PCOS (  polycystic ovarian syndrome) We will continue to follow-up with the patient over time.   4. Vitamin D deficiency We will check labs today, and we will follow-up at patient's next visit.   - VITAMIN D 25 Hydroxy (Vit-D Deficiency, Fractures)  5. Other specified hypothyroidism Patient will continue her  medications as directed.   6. B12 deficiency We will check labs today, and we will follow-up at patient's next visit.   - Vitamin B12  7. Prediabetes We will check labs today, and we will follow-up at patient's next visit.   - Insulin, random  8. Health care maintenance We will check labs today, and we will follow-up at patient's next visit. EKG and IC were reviewed with the patient today.   - Insulin, random - Vitamin B12 - VITAMIN D 25 Hydroxy (Vit-D Deficiency, Fractures)  9. Depression screening Cynthia [name] had a positive depression screening. Depression is commonly associated with obesity and often results in emotional eating behaviors. We will monitor this closely and work on CBT to help improve the non-hunger eating patterns. Referral to Psychology may be required if no improvement is seen as she continues in our clinic.  10. Morbid obesity (HCC)  11. Class 3 severe obesity due to excess calories with serious comorbidity and body mass index (BMI) of 45.0 to 49.9 in adult [name]) Cynthia [name] is currently in the action stage of change and her goal is to continue with weight loss efforts. I recommend Cynthia [name] begin the structured treatment plan as follows:  She has agreed to the Category 2 Plan.  Meal planning and intentional eating were discussed. Reviewed labs with the patient from 11/28/2022, CMP, lipids, CBC, glucose, A1c, and TSH.  Exercise goals: Doing physical therapy for several weeks.    Behavioral modification strategies: increasing lean protein intake, decreasing simple carbohydrates, increasing vegetables, increasing water intake, decreasing eating out, no skipping meals, meal planning and cooking strategies, keeping healthy foods in the home, and planning for success.  She was informed of the importance of frequent follow-up visits to maximize her success with intensive lifestyle modifications for her multiple health conditions. She was informed we would discuss her lab  results at her next visit unless there is a critical issue that needs to be addressed sooner. Cynthia [name] agreed to keep her next visit at the agreed upon time to discuss these results.  Objective:   Blood pressure 133/82, pulse (!) 59, temperature 98 F (36.7 C), height 5' 5.5" (1.664 m), weight 299 lb (135.6 kg), last menstrual period 04/24/2014, SpO2 99%. Body mass index is 49 kg/m.  EKG: Normal sinus rhythm, rate 63 BPM.  Indirect Calorimeter completed today shows a VO2 of 239 and a REE of 1642.  Her calculated basal metabolic rate is 9811 thus her basal metabolic rate is worse than expected.  General: Cooperative, alert, well developed, in no acute distress. HEENT: Conjunctivae and lids unremarkable. Cardiovascular: Regular rhythm.  Lungs: Normal work of breathing. Neurologic: No focal deficits.   Lab Results  Component Value Date   CREATININE 0.87 11/28/2022   BUN 18 11/28/2022   NA 142 11/28/2022   K 4.1 11/28/2022   CL 106 11/28/2022   CO2 25 11/28/2022   Lab Results  Component Value Date   ALT 18 11/28/2022   AST 15 11/28/2022   ALKPHOS 51 02/21/2021   BILITOT 0.3 11/28/2022   Lab Results  Component Value Date   HGBA1C 5.9 (H) 11/28/2022   HGBA1C 5.6 11/24/2021   HGBA1C 5.6 12/12/2020   HGBA1C  5.4 10/28/2018   HGBA1C 5.5 11/11/2017   Lab Results  Component Value Date   INSULIN 12.0 12/24/2022   Lab Results  Component Value Date   TSH 12.47 (H) 11/28/2022   Lab Results  Component Value Date   CHOL 176 11/28/2022   HDL 47 (L) 11/28/2022   LDLCALC 111 (H) 11/28/2022   TRIG 89 11/28/2022   CHOLHDL 3.7 11/28/2022   Lab Results  Component Value Date   WBC 6.2 11/28/2022   HGB 12.5 11/28/2022   HCT 38.7 11/28/2022   MCV 77.4 (L) 11/28/2022   PLT 229 11/28/2022   Lab Results  Component Value Date   IRON 52 11/24/2021   TIBC 317 11/24/2021   FERRITIN 80 11/24/2021   Attestation Statements:   Reviewed by clinician on day of visit: allergies,  medications, problem list, medical history, surgical history, family history, social history, and previous encounter notes.   Cynthia [name], am acting as Energy manager for [employer], DO.  I have reviewed the above documentation for accuracy and completeness, and I agree with the above. Corinna Capra, DO

## 2022-12-27 ENCOUNTER — Encounter: Payer: Self-pay | Admitting: Medical-Surgical

## 2022-12-27 ENCOUNTER — Other Ambulatory Visit (HOSPITAL_COMMUNITY): Payer: Self-pay

## 2023-01-02 ENCOUNTER — Other Ambulatory Visit (HOSPITAL_COMMUNITY): Payer: Self-pay

## 2023-01-09 ENCOUNTER — Ambulatory Visit: Payer: BC Managed Care – PPO | Admitting: Nurse Practitioner

## 2023-01-12 ENCOUNTER — Other Ambulatory Visit (HOSPITAL_COMMUNITY): Payer: Self-pay

## 2023-01-14 ENCOUNTER — Ambulatory Visit (HOSPITAL_BASED_OUTPATIENT_CLINIC_OR_DEPARTMENT_OTHER): Payer: BC Managed Care – PPO | Admitting: Internal Medicine

## 2023-01-14 ENCOUNTER — Other Ambulatory Visit (HOSPITAL_COMMUNITY): Payer: Self-pay

## 2023-01-14 VITALS — Ht 66.0 in | Wt 298.0 lb

## 2023-01-15 ENCOUNTER — Other Ambulatory Visit (HOSPITAL_COMMUNITY): Payer: Self-pay

## 2023-01-20 DIAGNOSIS — R7309 Other abnormal glucose: Secondary | ICD-10-CM | POA: Diagnosis not present

## 2023-01-22 ENCOUNTER — Ambulatory Visit (HOSPITAL_BASED_OUTPATIENT_CLINIC_OR_DEPARTMENT_OTHER): Payer: BC Managed Care – PPO | Attending: Medical-Surgical | Admitting: Internal Medicine

## 2023-01-22 VITALS — Ht 66.0 in | Wt 298.0 lb

## 2023-01-22 DIAGNOSIS — R0683 Snoring: Secondary | ICD-10-CM | POA: Insufficient documentation

## 2023-01-22 DIAGNOSIS — G4733 Obstructive sleep apnea (adult) (pediatric): Secondary | ICD-10-CM | POA: Insufficient documentation

## 2023-01-22 DIAGNOSIS — Z6841 Body Mass Index (BMI) 40.0 and over, adult: Secondary | ICD-10-CM | POA: Diagnosis not present

## 2023-01-27 ENCOUNTER — Encounter: Payer: Self-pay | Admitting: Medical-Surgical

## 2023-01-27 NOTE — Procedures (Signed)
   Patient Name: Cynthia [name], [place] Study Date: 01/23/2023 Gender: Female D.O.B: Oct 05, 1965 Age (years): 49 Referring Provider: Christen Butter NP Height (inches): 56 Interpreting Physician: Jetty Duhamel MD, ABSM Weight (lbs): 298 RPSGT: [place] S[place] BMI: 67 MRN: [id number] Neck Size: <br>  CLINICAL INFORMATION Sleep Study Type: HST Indication for sleep study: OSA Epworth Sleepiness Score: 6  SLEEP STUDY TECHNIQUE A multi-channel overnight portable sleep study was performed. The channels recorded were: nasal airflow, thoracic respiratory movement, and oxygen saturation with a pulse oximetry. Snoring was also monitored.  MEDICATIONS Patient self administered medications include: none reported.  SLEEP ARCHITECTURE Patient was studied for 341.4 minutes. The sleep efficiency was 100.0 % and the patient was supine for 0%. The arousal index was 0.0 per hour.  RESPIRATORY PARAMETERS The overall AHI was 17.8 per hour, with a central apnea index of 0 per hour. The oxygen nadir was 87% during sleep.  CARDIAC DATA Mean heart rate during sleep was 57.6 bpm.  IMPRESSIONS - Moderate obstructive sleep apnea occurred during this study (AHI = 17.8/h). - Mild oxygen desaturation was noted during this study (Min O2 = 87%, Mean 96%). - Patient snored.  DIAGNOSIS - Obstructive Sleep Apnea (G47.33)  RECOMMENDATIONS - Suggest CPAP titration sleep study or autopap. Other options would be based on clinical judgment. - Be careful with alcohol, sedatives and other CNS depressants that may worsen sleep apnea and disrupt normal sleep architecture. - Sleep hygiene should be reviewed to assess factors that may improve sleep quality. - Weight management and regular exercise should be initiated or continued.  [Electronically signed] 01/27/2023 11:15 AM  Jetty Duhamel MD, ABSM Diplomate, American Board of Sleep Medicine NPI: [id number]                          Jetty Duhamel Diplomate, American Board of Sleep Medicine  ELECTRONICALLY SIGNED ON:  01/27/2023, 11:13 AM [facility] PH: [phone]   FX: [phone] ACCREDITED BY THE AMERICAN ACADEMY OF SLEEP MEDICINE

## 2023-01-28 NOTE — Progress Notes (Signed)
Left a message for a return call. 01/28/2023 9:27 am.

## 2023-01-29 MED ORDER — AMBULATORY NON FORMULARY MEDICATION
0 refills | Status: AC
Start: 2023-01-29 — End: ?

## 2023-01-29 NOTE — Addendum Note (Signed)
Addended byChristen Butter on: 01/29/2023 11:32 AM   Modules accepted: Orders

## 2023-01-30 ENCOUNTER — Encounter: Payer: Self-pay | Admitting: Sports Medicine

## 2023-01-30 ENCOUNTER — Ambulatory Visit (INDEPENDENT_AMBULATORY_CARE_PROVIDER_SITE_OTHER): Payer: BC Managed Care – PPO | Admitting: Sports Medicine

## 2023-01-30 ENCOUNTER — Other Ambulatory Visit (INDEPENDENT_AMBULATORY_CARE_PROVIDER_SITE_OTHER): Payer: BC Managed Care – PPO

## 2023-01-30 DIAGNOSIS — M170 Bilateral primary osteoarthritis of knee: Secondary | ICD-10-CM | POA: Diagnosis not present

## 2023-01-30 NOTE — Progress Notes (Signed)
    Procedures performed today:    Procedure: Real-time Ultrasound Guided injection of the right knee Device: Samsung HS60  Verbal informed consent obtained.  Time-out conducted.  Noted no overlying erythema, induration, or other signs of local infection.  Skin prepped in a sterile fashion.  Local anesthesia: Topical Ethyl chloride.  With sterile technique and under real time ultrasound guidance:  mild effusion noted,1 cc Kenalog 40, 2 cc lidocaine, 2 cc bupivacaine injected easily Completed without difficulty  Advised to call if fevers/chills, erythema, induration, drainage, or persistent bleeding.  Images permanently stored and available for review in PACS.  Impression: Technically successful ultrasound guided injection.  Independent interpretation of notes and tests performed by another provider:   None.  Brief History, Exam, Impression, and Recommendations:    Primary osteoarthritis of both knees Known bilateral knee osteoarthritis, left knee is doing well, she had an injection back in 2020, right knee is hurting now. PCP started Celebrex and got x-rays, x-rays do show osteoarthritis.. Celebrex not effective, we did a right knee joint injection today, she will do home physical therapy, return to see me in 6 weeks, we will work on approval for viscosupplementation if not better.    ____________________________________________ [name]. Benjamin Stain, M.D., ABFM., CAQSM., AME. Primary Care and Sports Medicine [facility]  Adjunct Professor of Family Medicine  [employer]  Restaurant manager, fast food

## 2023-01-30 NOTE — Assessment & Plan Note (Signed)
Known bilateral knee osteoarthritis, left knee is doing well, she had an injection back in 2020, right knee is hurting now. PCP started Celebrex and got x-rays, x-rays do show osteoarthritis.. Celebrex not effective, we did a right knee joint injection today, she will do home physical therapy, return to see me in 6 weeks, we will work on approval for viscosupplementation if not better.

## 2023-01-31 DIAGNOSIS — M170 Bilateral primary osteoarthritis of knee: Secondary | ICD-10-CM | POA: Diagnosis not present

## 2023-01-31 MED ORDER — TRIAMCINOLONE ACETONIDE 40 MG/ML IJ SUSP
40.0000 mg | Freq: Once | INTRAMUSCULAR | Status: AC
Start: 2023-01-31 — End: 2023-01-31
  Administered 2023-01-31: 40 mg via INTRAMUSCULAR

## 2023-01-31 NOTE — Addendum Note (Signed)
Addended by: Carren Rang A on: 01/31/2023 02:34 PM   Modules accepted: Orders

## 2023-02-06 ENCOUNTER — Other Ambulatory Visit (HOSPITAL_COMMUNITY): Payer: Self-pay

## 2023-02-06 ENCOUNTER — Ambulatory Visit (INDEPENDENT_AMBULATORY_CARE_PROVIDER_SITE_OTHER): Payer: BC Managed Care – PPO | Admitting: Nurse Practitioner

## 2023-02-06 ENCOUNTER — Encounter: Payer: Self-pay | Admitting: Nurse Practitioner

## 2023-02-06 VITALS — BP 137/79 | HR 57 | Temp 97.7°F | Ht 66.0 in | Wt 297.0 lb

## 2023-02-06 DIAGNOSIS — R7989 Other specified abnormal findings of blood chemistry: Secondary | ICD-10-CM

## 2023-02-06 DIAGNOSIS — R7303 Prediabetes: Secondary | ICD-10-CM | POA: Diagnosis not present

## 2023-02-06 DIAGNOSIS — E559 Vitamin D deficiency, unspecified: Secondary | ICD-10-CM

## 2023-02-06 DIAGNOSIS — E038 Other specified hypothyroidism: Secondary | ICD-10-CM

## 2023-02-06 DIAGNOSIS — Z6841 Body Mass Index (BMI) 40.0 and over, adult: Secondary | ICD-10-CM

## 2023-02-06 MED ORDER — VITAMIN D (ERGOCALCIFEROL) 1.25 MG (50000 UNIT) PO CAPS
50000.0000 [IU] | ORAL_CAPSULE | ORAL | 0 refills | Status: DC
Start: 2023-02-06 — End: 2023-03-18
  Filled 2023-02-06: qty 5, 35d supply, fill #0

## 2023-02-06 NOTE — Progress Notes (Signed)
Office: [phone]  /  Fax: [phone]  WEIGHT SUMMARY AND BIOMETRICS  Weight Lost Since Last Visit: 2lb  Weight Gained Since Last Visit: 0lb   Vitals Temp: 97.7 F (36.5 C) BP: 137/79 Pulse Rate: (!) 57 SpO2: 98 %   Anthropometric Measurements Height: 5\' 6"  (1.676 m) Weight: 297 lb (134.7 kg) BMI (Calculated): 47.96 Weight at Last Visit: 299lb Weight Lost Since Last Visit: 2lb Weight Gained Since Last Visit: 0lb Starting Weight: 299lb Total Weight Loss (lbs): 2 lb (0.907 kg)   Body Composition  Body Fat %: 54.3 % Fat Mass (lbs): 161.6 lbs Muscle Mass (lbs): 129.2 lbs Visceral Fat Rating : 20   Other Clinical Data Fasting: Yes Labs: No Today's Visit #: 2 Starting Date: 12/24/22     HPI  Chief Complaint: OBESITY  Cynthia [name] is here to discuss her progress with her obesity treatment plan. She is on the the Category 1 Plan and states she is following her eating plan approximately 75 % of the time. She states she is exercising 30 minutes 2 days per week-walking and biking.   Interval History:  Since last office visit she has lost 2 pounds.  Feels like the meal plan is a lot of food and struggles with eating all of the food.   She eats fish, chicken and limited beef.  She is drinking water with flavoring, smoothies with fruit with protein powder.    Pharmacotherapy for weight loss: She is not currently taking medications  for medical weight loss.   Previous pharmacotherapy for medical weight loss:  Phentermine, Saxenda  Bariatric surgery:  Patient is status post lapband in 2010 by Dr Daphine Deutscher.  No fluid.  Prior to lapband her highest weight was 378 lbs and her nadir weight was 250 lbs.     Vit D deficiency  She is taking Vit D OTC.  Denies side effects.  Denies nausea, vomiting or muscle weakness.    Lab Results  Component Value Date   VD25OH 26.4 (L) 12/24/2022   VD25OH 40.93 03/29/2021   VD25OH 41.2 09/26/2020    High Vit B12 Taking Vit B12 1,000  OTC.  Stopped taking one month ago after seeing lab results.  Hypothyroid Started taking Synthorid 25 mcg after last visit with PCP.  Notes she is overall feeling better since starting Synthroid.  Plans to have labs rechecked this week.   Prediabetes Last A1c was 5.9  Medication(s): never been on meds Polyphagia:Yes FH: maternal side of the family  Lab Results  Component Value Date   HGBA1C 5.9 (H) 11/28/2022   HGBA1C 5.6 11/24/2021   HGBA1C 5.6 12/12/2020   HGBA1C 5.4 10/28/2018   HGBA1C 5.5 11/11/2017   Lab Results  Component Value Date   INSULIN 12.0 12/24/2022      PHYSICAL EXAM:  Blood pressure 137/79, pulse (!) 57, temperature 97.7 F (36.5 C), height 5\' 6"  (1.676 m), weight 297 lb (134.7 kg), last menstrual period 04/24/2014, SpO2 98%. Body mass index is 47.94 kg/m.  General: She is overweight, cooperative, alert, well developed, and in no acute distress. PSYCH: Has normal mood, affect and thought process.   Extremities: No edema.  Neurologic: No gross sensory or motor deficits. No tremors or fasciculations noted.    DIAGNOSTIC DATA REVIEWED:  BMET    Component Value Date/Time   NA 142 11/28/2022 0952   K 4.1 11/28/2022 0952   CL 106 11/28/2022 0952   CO2 25 11/28/2022 0952   GLUCOSE 104 (H) 11/28/2022 2956  BUN 18 11/28/2022 0952   CREATININE 0.87 11/28/2022 0952   CALCIUM 9.0 11/28/2022 0952   GFRNONAA >60 02/21/2021 1603   GFRNONAA 81 08/11/2019 1508   GFRAA >60 02/09/2020 2326   GFRAA 94 08/11/2019 1508   Lab Results  Component Value Date   HGBA1C 5.9 (H) 11/28/2022   HGBA1C 5.5 11/11/2017   Lab Results  Component Value Date   INSULIN 12.0 12/24/2022   Lab Results  Component Value Date   TSH 12.47 (H) 11/28/2022   CBC    Component Value Date/Time   WBC 6.2 11/28/2022 0952   RBC 5.00 11/28/2022 0952   HGB 12.5 11/28/2022 0952   HCT 38.7 11/28/2022 0952   PLT 229 11/28/2022 0952   MCV 77.4 (L) 11/28/2022 0952   MCH 25.0 (L)  11/28/2022 0952   MCHC 32.3 11/28/2022 0952   RDW 14.6 11/28/2022 0952   Iron Studies    Component Value Date/Time   IRON 52 11/24/2021 0000   TIBC 317 11/24/2021 0000   FERRITIN 80 11/24/2021 0000   IRONPCTSAT 16 11/24/2021 0000   Lipid Panel     Component Value Date/Time   CHOL 176 11/28/2022 0952   TRIG 89 11/28/2022 0952   HDL 47 (L) 11/28/2022 0952   CHOLHDL 3.7 11/28/2022 0952   LDLCALC 111 (H) 11/28/2022 0952   Hepatic Function Panel     Component Value Date/Time   PROT 6.7 11/28/2022 0952   ALBUMIN 3.8 02/21/2021 1603   AST 15 11/28/2022 0952   ALT 18 11/28/2022 0952   ALKPHOS 51 02/21/2021 1603   BILITOT 0.3 11/28/2022 0952   BILIDIR 0.1 10/02/2019 1024   IBILI 0.4 10/02/2019 1024      Component Value Date/Time   TSH 12.47 (H) 11/28/2022 0952   Nutritional Lab Results  Component Value Date   VD25OH 26.4 (L) 12/24/2022   VD25OH 40.93 03/29/2021   VD25OH 41.2 09/26/2020     ASSESSMENT AND PLAN  TREATMENT PLAN FOR OBESITY:  Recommended Dietary Goals  Cynthia [name] is currently in the action stage of change. As such, her goal is to continue weight management plan. She has agreed to the Category 1 Plan.  Behavioral Intervention  We discussed the following Behavioral Modification Strategies today: increasing lean protein intake, decreasing simple carbohydrates , increasing vegetables, increasing lower glycemic fruits, increasing water intake, work on meal planning and preparation, reading food labels , keeping healthy foods at home, continue to practice mindfulness when eating, and planning for success.  Additional resources provided today: protein powder, fruit choices,  Recommended Physical Activity Goals  Cynthia [name] has been advised to work up to 150 minutes of moderate intensity aerobic activity a week and strengthening exercises 2-3 times per week for cardiovascular health, weight loss maintenance and preservation of muscle mass.   She has agreed to  Continue current level of physical activity , Think about ways to increase daily physical activity and overcoming barriers to exercise, and Increase physical activity in their day and reduce sedentary time (increase NEAT).     ASSOCIATED CONDITIONS ADDRESSED TODAY  Action/Plan  Vitamin D deficiency -     Vitamin D (Ergocalciferol); Take 1 capsule (50,000 Units total) by mouth every 7 (seven) days.  Dispense: 5 capsule; Refill: 0. Side effects discussed  Low Vitamin D level contributes to fatigue and are associated with obesity, breast, and colon cancer. She agrees to continue to take prescription Vitamin D @50 ,000 IU every week and will follow-up for routine testing of Vitamin D, at  least 2-3 times per year to avoid over-replacement.   High serum vitamin B12 Stopped Vit B12.  Continue taking MV daily.  Will continue to monitor.  Prediabetes Cynthia [name] will continue to work on weight loss, exercise, and decreasing simple carbohydrates to help decrease the risk of diabetes.    Other specified hypothyroidism Continue to follow up with PCP.  Continue meds as directed.  Keep lab appt this week  Morbid obesity (HCC)  BMI 45.0-49.9, adult (HCC)      Labs reviewed in chart from 12/24/22   Return in about 2 weeks (around 02/20/2023).Marland Kitchen She was informed of the importance of frequent follow up visits to maximize her success with intensive lifestyle modifications for her multiple health conditions.   ATTESTASTION STATEMENTS:  Reviewed by clinician on day of visit: allergies, medications, problem list, medical history, surgical history, family history, social history, and previous encounter notes.     Theodis Sato. Ailani Governale FNP-C

## 2023-02-06 NOTE — Assessment & Plan Note (Signed)
Age 57

## 2023-02-07 ENCOUNTER — Encounter: Payer: Self-pay | Admitting: Medical-Surgical

## 2023-02-15 DIAGNOSIS — G4733 Obstructive sleep apnea (adult) (pediatric): Secondary | ICD-10-CM | POA: Diagnosis not present

## 2023-02-18 ENCOUNTER — Encounter: Payer: Self-pay | Admitting: Medical-Surgical

## 2023-02-19 DIAGNOSIS — R7309 Other abnormal glucose: Secondary | ICD-10-CM | POA: Diagnosis not present

## 2023-02-20 ENCOUNTER — Telehealth: Payer: Self-pay

## 2023-02-20 NOTE — Telephone Encounter (Signed)
Please contact patient to schedule a follow up evaluation with Christen Butter between the dates 03-18-2023 to 05-16-2023.  [facility] is requiring for her to have a follow up appointment from her PAP therapy.  Thank you,  Okey Regal, CMA

## 2023-02-25 ENCOUNTER — Ambulatory Visit (INDEPENDENT_AMBULATORY_CARE_PROVIDER_SITE_OTHER): Payer: BC Managed Care – PPO | Admitting: Nurse Practitioner

## 2023-02-25 ENCOUNTER — Encounter: Payer: Self-pay | Admitting: Nurse Practitioner

## 2023-02-25 VITALS — BP 139/77 | HR 68 | Temp 98.2°F | Ht 66.0 in | Wt 295.0 lb

## 2023-02-25 DIAGNOSIS — Z6841 Body Mass Index (BMI) 40.0 and over, adult: Secondary | ICD-10-CM | POA: Diagnosis not present

## 2023-02-25 DIAGNOSIS — E559 Vitamin D deficiency, unspecified: Secondary | ICD-10-CM | POA: Diagnosis not present

## 2023-02-25 NOTE — Progress Notes (Signed)
Office: [phone]  /  Fax: [phone]  WEIGHT SUMMARY AND BIOMETRICS  Weight Lost Since Last Visit: 2lb  Weight Gained Since Last Visit: 0lb   Vitals Temp: 98.2 F (36.8 C) BP: 139/77 Pulse Rate: 68 SpO2: 98 %   Anthropometric Measurements Height: 5\' 6"  (1.676 m) Weight: 295 lb (133.8 kg) BMI (Calculated): 47.64 Weight at Last Visit: 297lb Weight Lost Since Last Visit: 2lb Weight Gained Since Last Visit: 0lb Starting Weight: 299lb Total Weight Loss (lbs): 4 lb (1.814 kg)   Body Composition  Body Fat %: 51.3 % Fat Mass (lbs): 151.6 lbs Muscle Mass (lbs): 136.8 lbs Visceral Fat Rating : 19   Other Clinical Data Fasting: No Labs: No Today's Visit #: 3 Starting Date: 12/24/22     HPI  Chief Complaint: OBESITY  Cynthia [name] is here to discuss her progress with her obesity treatment plan. She is on the the Category 1 Plan and states she is following her eating plan approximately 75 % of the time. She states she is exercising 35 minutes 2 days per week.   Interval History:  Since last office visit she has lost 2 pounds.  She hasn't been feeling well since her last visit.  She is drinking a protein shake daily-blueberries, strawberries and sometimes a banana with protein powder, ice, water and flavoring.  She is struggling with drinking Mtn Dew and is trying to stop them.  Struggles with cravings. Aiming to try to get in more protein and has increased her water intake.  She is celebrating her anniversary this weekend.     Her highest weight was 378 lbs.    Pharmacotherapy for weight loss: She is not currently taking medications  for medical weight loss.  Denies side effects.    Previous pharmacotherapy for medical weight loss:  Saxenda and Phentermine  Bariatric surgery:  Patient is status post lapband in 2010 by Dr Daphine Deutscher.  No fluid.  Prior to lapband her highest weight was 378 lbs and her nadir weight was 250 lbs.   Vit D deficiency  She is taking Vit D  50,000 IU weekly.  Denies side effects.  Denies nausea, vomiting or muscle weakness.    Lab Results  Component Value Date   VD25OH 26.4 (L) 12/24/2022   VD25OH 40.93 03/29/2021   VD25OH 41.2 09/26/2020        PHYSICAL EXAM:  Blood pressure 139/77, pulse 68, temperature 98.2 F (36.8 C), height 5\' 6"  (1.676 m), weight 295 lb (133.8 kg), last menstrual period 04/24/2014, SpO2 98%. Body mass index is 47.61 kg/m.  General: She is overweight, cooperative, alert, well developed, and in no acute distress. PSYCH: Has normal mood, affect and thought process.   Extremities: No edema.  Neurologic: No gross sensory or motor deficits. No tremors or fasciculations noted.    DIAGNOSTIC DATA REVIEWED:  BMET    Component Value Date/Time   NA 142 11/28/2022 0952   K 4.1 11/28/2022 0952   CL 106 11/28/2022 0952   CO2 25 11/28/2022 0952   GLUCOSE 104 (H) 11/28/2022 0952   BUN 18 11/28/2022 0952   CREATININE 0.87 11/28/2022 0952   CALCIUM 9.0 11/28/2022 0952   GFRNONAA >60 02/21/2021 1603   GFRNONAA 81 08/11/2019 1508   GFRAA >60 02/09/2020 2326   GFRAA 94 08/11/2019 1508   Lab Results  Component Value Date   HGBA1C 5.9 (H) 11/28/2022   HGBA1C 5.5 11/11/2017   Lab Results  Component Value Date   INSULIN 12.0 12/24/2022  Lab Results  Component Value Date   TSH 12.47 (H) 11/28/2022   CBC    Component Value Date/Time   WBC 6.2 11/28/2022 0952   RBC 5.00 11/28/2022 0952   HGB 12.5 11/28/2022 0952   HCT 38.7 11/28/2022 0952   PLT 229 11/28/2022 0952   MCV 77.4 (L) 11/28/2022 0952   MCH 25.0 (L) 11/28/2022 0952   MCHC 32.3 11/28/2022 0952   RDW 14.6 11/28/2022 0952   Iron Studies    Component Value Date/Time   IRON 52 11/24/2021 0000   TIBC 317 11/24/2021 0000   FERRITIN 80 11/24/2021 0000   IRONPCTSAT 16 11/24/2021 0000   Lipid Panel     Component Value Date/Time   CHOL 176 11/28/2022 0952   TRIG 89 11/28/2022 0952   HDL 47 (L) 11/28/2022 0952   CHOLHDL 3.7  11/28/2022 0952   LDLCALC 111 (H) 11/28/2022 0952   Hepatic Function Panel     Component Value Date/Time   PROT 6.7 11/28/2022 0952   ALBUMIN 3.8 02/21/2021 1603   AST 15 11/28/2022 0952   ALT 18 11/28/2022 0952   ALKPHOS 51 02/21/2021 1603   BILITOT 0.3 11/28/2022 0952   BILIDIR 0.1 10/02/2019 1024   IBILI 0.4 10/02/2019 1024      Component Value Date/Time   TSH 12.47 (H) 11/28/2022 0952   Nutritional Lab Results  Component Value Date   VD25OH 26.4 (L) 12/24/2022   VD25OH 40.93 03/29/2021   VD25OH 41.2 09/26/2020     ASSESSMENT AND PLAN  TREATMENT PLAN FOR OBESITY:  Recommended Dietary Goals  Cynthia [name] is currently in the action stage of change. As such, her goal is to continue weight management plan. She has agreed to the Category 1 Plan.  Behavioral Intervention  We discussed the following Behavioral Modification Strategies today: increasing lean protein intake to established goals, increasing vegetables, increasing water intake , work on meal planning and preparation, reading food labels , keeping healthy foods at home, and continue to work on maintaining a reduced calorie state, getting the recommended amount of protein, incorporating whole foods, making healthy choices, staying well hydrated and practicing mindfulness when eating..  Additional resources provided today: protein shakes handout  Recommended Physical Activity Goals  Cynthia [name] has been advised to work up to 150 minutes of moderate intensity aerobic activity a week and strengthening exercises 2-3 times per week for cardiovascular health, weight loss maintenance and preservation of muscle mass.   She has agreed to Increase the intensity, frequency or duration of strengthening exercises  and Increase the intensity, frequency or duration of aerobic exercises     ASSOCIATED CONDITIONS ADDRESSED TODAY  Action/Plan  Vitamin D deficiency Continue Vit D as directed  Morbid obesity (HCC)  BMI 45.0-49.9,  adult (HCC)         Return in about 2 weeks (around 03/11/2023).Marland Kitchen She was informed of the importance of frequent follow up visits to maximize her success with intensive lifestyle modifications for her multiple health conditions.   ATTESTASTION STATEMENTS:  Reviewed by clinician on day of visit: allergies, medications, problem list, medical history, surgical history, family history, social history, and previous encounter notes.   Time spent on visit including pre-visit chart review and post-visit care and charting was 30 minutes.    Theodis Sato. Maxime Beckner FNP-C

## 2023-03-13 ENCOUNTER — Ambulatory Visit (INDEPENDENT_AMBULATORY_CARE_PROVIDER_SITE_OTHER): Payer: BC Managed Care – PPO | Admitting: Sports Medicine

## 2023-03-13 ENCOUNTER — Encounter: Payer: Self-pay | Admitting: Sports Medicine

## 2023-03-13 ENCOUNTER — Other Ambulatory Visit (INDEPENDENT_AMBULATORY_CARE_PROVIDER_SITE_OTHER): Payer: Self-pay

## 2023-03-13 DIAGNOSIS — M170 Bilateral primary osteoarthritis of knee: Secondary | ICD-10-CM | POA: Diagnosis not present

## 2023-03-13 NOTE — Assessment & Plan Note (Signed)
Cade returns, she has bilateral knee osteoarthritis, she had a left knee injection back in 2020, we saw her in September, her right knee was hurting so we did an injection, now her left knee is starting to hurt again, we did a left knee injection today, she should continue home physical therapy, Celebrex, return to see me in 6 weeks as needed.

## 2023-03-13 NOTE — Progress Notes (Signed)
    Procedures performed today:    Procedure: Real-time Ultrasound Guided injection of the left knee Device: Samsung HS60  Verbal informed consent obtained.  Time-out conducted.  Noted no overlying erythema, induration, or other signs of local infection.  Skin prepped in a sterile fashion.  Local anesthesia: Topical Ethyl chloride.  With sterile technique and under real time ultrasound guidance:  mild effusion noted,1 cc Kenalog 40, 2 cc lidocaine, 2 cc bupivacaine injected easily Completed without difficulty  Advised to call if fevers/chills, erythema, induration, drainage, or persistent bleeding.  Images permanently stored and available for review in PACS.  Impression: Technically successful ultrasound guided injection.  Independent interpretation of notes and tests performed by another provider:   None.  Brief History, Exam, Impression, and Recommendations:    Primary osteoarthritis of both knees Cynthia [name] returns, she has bilateral knee osteoarthritis, she had a left knee injection back in 2020, we saw her in September, her right knee was hurting so we did an injection, now her left knee is starting to hurt again, we did a left knee injection today, she should continue home physical therapy, Celebrex, return to see me in 6 weeks as needed.    ____________________________________________ [name]. Benjamin Stain, M.D., ABFM., CAQSM., AME. Primary Care and Sports Medicine [facility]  Adjunct Professor of Family Medicine  [employer]  Restaurant manager, fast food

## 2023-03-14 DIAGNOSIS — M170 Bilateral primary osteoarthritis of knee: Secondary | ICD-10-CM | POA: Diagnosis not present

## 2023-03-14 MED ORDER — TRIAMCINOLONE ACETONIDE 40 MG/ML IJ SUSP
40.0000 mg | Freq: Once | INTRAMUSCULAR | Status: AC
Start: 2023-03-14 — End: 2023-03-14
  Administered 2023-03-14: 40 mg via INTRAMUSCULAR

## 2023-03-14 NOTE — Addendum Note (Signed)
Addended by: Carren Rang A on: 03/14/2023 01:57 PM   Modules accepted: Orders

## 2023-03-17 DIAGNOSIS — G4733 Obstructive sleep apnea (adult) (pediatric): Secondary | ICD-10-CM | POA: Diagnosis not present

## 2023-03-18 ENCOUNTER — Encounter: Payer: Self-pay | Admitting: Nurse Practitioner

## 2023-03-18 ENCOUNTER — Ambulatory Visit (INDEPENDENT_AMBULATORY_CARE_PROVIDER_SITE_OTHER): Payer: BC Managed Care – PPO | Admitting: Nurse Practitioner

## 2023-03-18 ENCOUNTER — Other Ambulatory Visit (HOSPITAL_COMMUNITY): Payer: Self-pay

## 2023-03-18 VITALS — BP 132/78 | HR 67 | Temp 98.4°F | Ht 66.0 in | Wt 295.0 lb

## 2023-03-18 DIAGNOSIS — E559 Vitamin D deficiency, unspecified: Secondary | ICD-10-CM | POA: Diagnosis not present

## 2023-03-18 DIAGNOSIS — Z6841 Body Mass Index (BMI) 40.0 and over, adult: Secondary | ICD-10-CM

## 2023-03-18 MED ORDER — VITAMIN D (ERGOCALCIFEROL) 1.25 MG (50000 UNIT) PO CAPS
50000.0000 [IU] | ORAL_CAPSULE | ORAL | 0 refills | Status: DC
  Filled 2023-03-18 – 2023-04-05 (×2): qty 5, 35d supply, fill #0

## 2023-03-18 NOTE — Progress Notes (Signed)
Office: [phone]  /  Fax: [phone]  WEIGHT SUMMARY AND BIOMETRICS  Weight Lost Since Last Visit: 0lb  Weight Gained Since Last Visit: 0lb   Vitals Temp: 98.4 F (36.9 C) BP: 132/78 Pulse Rate: 67 SpO2: 98 %   Anthropometric Measurements Height: 5\' 6"  (1.676 m) Weight: 295 lb (133.8 kg) BMI (Calculated): 47.64 Weight at Last Visit: 295lb Weight Lost Since Last Visit: 0lb Weight Gained Since Last Visit: 0lb Starting Weight: 299lb Total Weight Loss (lbs): 4 lb (1.814 kg)   Body Composition  Body Fat %: 52.9 % Fat Mass (lbs): 156.2 lbs Muscle Mass (lbs): 132.2 lbs Visceral Fat Rating : 19   Other Clinical Data Fasting: No Labs: No Today's Visit #: 4 Starting Date: 12/24/22     HPI  Chief Complaint: OBESITY  Cynthia [name] is here to discuss her progress with her obesity treatment plan. She is on the the Category 1 Plan and states she is following her eating plan approximately 40 % of the time. She states she is exercising 20 minutes 3 days per week.   Interval History:  Since last office visit she has maintained her weight.  She had multiple celebrations since her last visit.  She is concerned about weight gain over the holidays.  She is going out of town for Christmas week and has some celebrations coming up.  She has been meal prepping.  She is drinking water and occ soda.    Pharmacotherapy for weight loss: She is not currently taking medications  for medical weight loss.  Denies side effects.     Previous pharmacotherapy for medical weight loss:  Saxenda and Phentermine   Bariatric surgery:  Patient is status post lapband in 2010 by Dr Daphine Deutscher.  No fluid.  Prior to lapband her highest weight was 378 lbs and her nadir weight was 250 lbs.   Vit D deficiency  She is taking Vit D 50,000 IU weekly.  Denies side effects.  Denies nausea, vomiting or muscle weakness.    Lab Results  Component Value Date   VD25OH 26.4 (L) 12/24/2022   VD25OH 40.93  03/29/2021   VD25OH 41.2 09/26/2020      PHYSICAL EXAM:  Blood pressure 132/78, pulse 67, temperature 98.4 F (36.9 C), height 5\' 6"  (1.676 m), weight 295 lb (133.8 kg), last menstrual period 04/24/2014, SpO2 98%. Body mass index is 47.61 kg/m.  General: She is overweight, cooperative, alert, well developed, and in no acute distress. PSYCH: Has normal mood, affect and thought process.   Extremities: No edema.  Neurologic: No gross sensory or motor deficits. No tremors or fasciculations noted.    DIAGNOSTIC DATA REVIEWED:  BMET    Component Value Date/Time   NA 142 11/28/2022 0952   K 4.1 11/28/2022 0952   CL 106 11/28/2022 0952   CO2 25 11/28/2022 0952   GLUCOSE 104 (H) 11/28/2022 0952   BUN 18 11/28/2022 0952   CREATININE 0.87 11/28/2022 0952   CALCIUM 9.0 11/28/2022 0952   GFRNONAA >60 02/21/2021 1603   GFRNONAA 81 08/11/2019 1508   GFRAA >60 02/09/2020 2326   GFRAA 94 08/11/2019 1508   Lab Results  Component Value Date   HGBA1C 5.9 (H) 11/28/2022   HGBA1C 5.5 11/11/2017   Lab Results  Component Value Date   INSULIN 12.0 12/24/2022   Lab Results  Component Value Date   TSH 12.47 (H) 11/28/2022   CBC    Component Value Date/Time   WBC 6.2 11/28/2022 0952   RBC  5.00 11/28/2022 0952   HGB 12.5 11/28/2022 0952   HCT 38.7 11/28/2022 0952   PLT 229 11/28/2022 0952   MCV 77.4 (L) 11/28/2022 0952   MCH 25.0 (L) 11/28/2022 0952   MCHC 32.3 11/28/2022 0952   RDW 14.6 11/28/2022 0952   Iron Studies    Component Value Date/Time   IRON 52 11/24/2021 0000   TIBC 317 11/24/2021 0000   FERRITIN 80 11/24/2021 0000   IRONPCTSAT 16 11/24/2021 0000   Lipid Panel     Component Value Date/Time   CHOL 176 11/28/2022 0952   TRIG 89 11/28/2022 0952   HDL 47 (L) 11/28/2022 0952   CHOLHDL 3.7 11/28/2022 0952   LDLCALC 111 (H) 11/28/2022 0952   Hepatic Function Panel     Component Value Date/Time   PROT 6.7 11/28/2022 0952   ALBUMIN 3.8 02/21/2021 1603   AST  15 11/28/2022 0952   ALT 18 11/28/2022 0952   ALKPHOS 51 02/21/2021 1603   BILITOT 0.3 11/28/2022 0952   BILIDIR 0.1 10/02/2019 1024   IBILI 0.4 10/02/2019 1024      Component Value Date/Time   TSH 12.47 (H) 11/28/2022 0952   Nutritional Lab Results  Component Value Date   VD25OH 26.4 (L) 12/24/2022   VD25OH 40.93 03/29/2021   VD25OH 41.2 09/26/2020     ASSESSMENT AND PLAN  TREATMENT PLAN FOR OBESITY:  Recommended Dietary Goals  Cynthia [name] is currently in the action stage of change. As such, her goal is to continue weight management plan. She has agreed to the Category 1 Plan.  Behavioral Intervention  We discussed the following Behavioral Modification Strategies today: work on tracking and journaling calories using tracking application and continue to work on maintaining a reduced calorie state, getting the recommended amount of protein, incorporating whole foods, making healthy choices, staying well hydrated and practicing mindfulness when eating..  Additional resources provided today: NA  Recommended Physical Activity Goals  Cynthia [name] has been advised to work up to 150 minutes of moderate intensity aerobic activity a week and strengthening exercises 2-3 times per week for cardiovascular health, weight loss maintenance and preservation of muscle mass.   She has agreed to Think about enjoyable ways to increase daily physical activity and overcoming barriers to exercise, Increase physical activity in their day and reduce sedentary time (increase NEAT)., and Work on scheduling and tracking physical activity.    Pharmacotherapy We discussed various medication options to help Cynthia [name] with her weight loss efforts and we both agreed to consider her options.  To call insurance about coverage.    ASSOCIATED CONDITIONS ADDRESSED TODAY  Action/Plan  Vitamin D deficiency -     Vitamin D (Ergocalciferol); Take 1 capsule (50,000 Units total) by mouth every 7 (seven) days.  Dispense:  5 capsule; Refill: 0  Morbid obesity (HCC)  BMI 45.0-49.9, adult [facility]     Information given on [email or web address] Multiple recipes given today     Return in about 2 weeks (around 04/01/2023).Marland Kitchen She was informed of the importance of frequent follow up visits to maximize her success with intensive lifestyle modifications for her multiple health conditions.   ATTESTASTION STATEMENTS:  Reviewed by clinician on day of visit: allergies, medications, problem list, medical history, surgical history, family history, social history, and previous encounter notes.    Theodis Sato. Cynthia Lopez FNP-C

## 2023-03-18 NOTE — Patient Instructions (Signed)

## 2023-03-22 ENCOUNTER — Ambulatory Visit: Payer: BC Managed Care – PPO | Admitting: Medical-Surgical

## 2023-03-22 DIAGNOSIS — R7309 Other abnormal glucose: Secondary | ICD-10-CM | POA: Diagnosis not present

## 2023-03-28 ENCOUNTER — Other Ambulatory Visit (HOSPITAL_COMMUNITY): Payer: Self-pay

## 2023-04-01 ENCOUNTER — Encounter: Payer: Self-pay | Admitting: Medical-Surgical

## 2023-04-01 ENCOUNTER — Ambulatory Visit (INDEPENDENT_AMBULATORY_CARE_PROVIDER_SITE_OTHER): Payer: BC Managed Care – PPO | Admitting: Medical-Surgical

## 2023-04-01 VITALS — BP 125/76 | HR 68 | Resp 20 | Ht 66.0 in | Wt 297.5 lb

## 2023-04-01 DIAGNOSIS — G4733 Obstructive sleep apnea (adult) (pediatric): Secondary | ICD-10-CM | POA: Diagnosis not present

## 2023-04-01 DIAGNOSIS — E038 Other specified hypothyroidism: Secondary | ICD-10-CM

## 2023-04-01 NOTE — Progress Notes (Signed)
        Established patient visit  History, exam, impression, and plan:  1. OSA on CPAP Pleasant 57 year old female presenting today for follow-up on sleep apnea.  She has started using a CPAP but is having significant trouble getting used to it.  She uses it intermittently.  On weeks that she works, she does not use send at all as she works third shift.  On weeks that she is not working, she will try to get a nap during the day and will wear it then.  Averages about 2-3 hours of wear time before ripping it off.  Couple of nights has gotten to 4 hours.  This week is dealing with some sinus congestion which makes it difficult to use.  When wearing it, she notes that she wakes with more energy and feeling better overall.  Plans to continue wearing it and feels that it will work beautifully once she gets used to it.  Requesting compliance report.  2. Other specified hypothyroidism Started on levothyroxine 25 mcg daily back in July for TSH greater than 12.  Tolerating the medication well and feels that it helps with energy and mental clarity.  Due for recheck of her TSH today.  Holding off on refills until we get her lab results due to possible need for dose adjustment. - TSH  Review of Systems  Constitutional:  Negative for chills, fever and malaise/fatigue.  Respiratory:  Negative for cough, shortness of breath and wheezing.   Cardiovascular:  Negative for chest pain, palpitations and leg swelling.  Neurological:  Negative for dizziness and headaches.  Psychiatric/Behavioral:  Negative for depression and suicidal ideas. The patient is not nervous/anxious and does not have insomnia.     Physical Exam Vitals reviewed.  Constitutional:      General: She is not in acute distress.    Appearance: Normal appearance. She is obese. She is not ill-appearing.  HENT:     Head: Normocephalic and atraumatic.  Cardiovascular:     Rate and Rhythm: Normal rate and regular rhythm.     Pulses: Normal pulses.      Heart sounds: Normal heart sounds. No murmur heard.    No friction rub. No gallop.  Pulmonary:     Effort: Pulmonary effort is normal. No respiratory distress.     Breath sounds: Normal breath sounds. No wheezing.  Skin:    General: Skin is warm and dry.  Neurological:     Mental Status: She is alert and oriented to person, place, and time.  Psychiatric:        Mood and Affect: Mood normal.        Behavior: Behavior normal.        Thought Content: Thought content normal.        Judgment: Judgment normal.   Procedures performed this visit: None.  Return in about 6 months (around 09/29/2023) for chronic disease follow up.  __________________________________ Thayer Ohm, DNP, APRN, FNP-BC Primary Care and Sports Medicine [facility] [place]

## 2023-04-02 ENCOUNTER — Other Ambulatory Visit (HOSPITAL_COMMUNITY): Payer: Self-pay

## 2023-04-02 LAB — TSH: TSH: 7.99 u[IU]/mL — ABNORMAL HIGH (ref 0.450–4.500)

## 2023-04-02 MED ORDER — LEVOTHYROXINE SODIUM 50 MCG PO TABS
25.0000 ug | ORAL_TABLET | Freq: Every day | ORAL | 0 refills | Status: DC
  Filled 2023-04-02: qty 45, 90d supply, fill #0
  Filled 2023-07-02: qty 45, 90d supply, fill #1

## 2023-04-02 NOTE — Addendum Note (Signed)
Addended byChristen Butter on: 04/02/2023 08:28 AM   Modules accepted: Orders

## 2023-04-05 ENCOUNTER — Other Ambulatory Visit (HOSPITAL_COMMUNITY): Payer: Self-pay

## 2023-04-08 ENCOUNTER — Ambulatory Visit: Payer: BC Managed Care – PPO | Admitting: Nurse Practitioner

## 2023-04-17 DIAGNOSIS — G4733 Obstructive sleep apnea (adult) (pediatric): Secondary | ICD-10-CM | POA: Diagnosis not present

## 2023-04-21 DIAGNOSIS — R7309 Other abnormal glucose: Secondary | ICD-10-CM | POA: Diagnosis not present

## 2023-04-24 ENCOUNTER — Ambulatory Visit (INDEPENDENT_AMBULATORY_CARE_PROVIDER_SITE_OTHER): Payer: BC Managed Care – PPO | Admitting: Sports Medicine

## 2023-04-24 ENCOUNTER — Encounter: Payer: Self-pay | Admitting: Sports Medicine

## 2023-04-24 DIAGNOSIS — M170 Bilateral primary osteoarthritis of knee: Secondary | ICD-10-CM | POA: Diagnosis not present

## 2023-04-24 MED ORDER — ACETAMINOPHEN ER 650 MG PO TBCR: 650.0000 mg | EXTENDED_RELEASE_TABLET | Freq: Three times a day (TID) | ORAL | Status: AC | PRN

## 2023-04-24 NOTE — Assessment & Plan Note (Signed)
Pleasant 57 year old female returns, bilateral knee osteoarthritis, she has now had bilateral knee injections and is doing okay, occasionally takes Celebrex and Tylenol, only has a bit of discomfort, not enough to pursue additional treatment. She will get more diligent with a home physical therapy, use arthritis strength Tylenol twice daily. We have discussed viscosupplementation. Return to see me as needed.

## 2023-04-24 NOTE — Progress Notes (Signed)
    Procedures performed today:    None.  Independent interpretation of notes and tests performed by another provider:   None.  Brief History, Exam, Impression, and Recommendations:    Primary osteoarthritis of both knees Pleasant 57 year old female returns, bilateral knee osteoarthritis, she has now had bilateral knee injections and is doing okay, occasionally takes Celebrex and Tylenol, only has a bit of discomfort, not enough to pursue additional treatment. She will get more diligent with a home physical therapy, use arthritis strength Tylenol twice daily. We have discussed viscosupplementation. Return to see me as needed.    ____________________________________________ [name]. Benjamin Stain, M.D., ABFM., CAQSM., AME. Primary Care and Sports Medicine [facility]  Adjunct Professor of Family Medicine  [place] [employer]  Restaurant manager, fast food

## 2023-05-17 DIAGNOSIS — G4733 Obstructive sleep apnea (adult) (pediatric): Secondary | ICD-10-CM | POA: Diagnosis not present

## 2023-05-22 DIAGNOSIS — R7309 Other abnormal glucose: Secondary | ICD-10-CM | POA: Diagnosis not present

## 2023-05-31 ENCOUNTER — Ambulatory Visit: Payer: 59 | Admitting: Medical-Surgical

## 2023-06-17 DIAGNOSIS — G4733 Obstructive sleep apnea (adult) (pediatric): Secondary | ICD-10-CM | POA: Diagnosis not present

## 2023-06-22 DIAGNOSIS — R7309 Other abnormal glucose: Secondary | ICD-10-CM | POA: Diagnosis not present

## 2023-06-24 ENCOUNTER — Encounter: Payer: Self-pay | Admitting: Medical-Surgical

## 2023-06-24 ENCOUNTER — Other Ambulatory Visit (HOSPITAL_COMMUNITY): Payer: Self-pay

## 2023-06-24 ENCOUNTER — Ambulatory Visit (INDEPENDENT_AMBULATORY_CARE_PROVIDER_SITE_OTHER): Payer: BC Managed Care – PPO | Admitting: Medical-Surgical

## 2023-06-24 VITALS — BP 115/73 | HR 79 | Resp 20 | Ht 66.0 in | Wt 285.1 lb

## 2023-06-24 DIAGNOSIS — E538 Deficiency of other specified B group vitamins: Secondary | ICD-10-CM

## 2023-06-24 DIAGNOSIS — G4726 Circadian rhythm sleep disorder, shift work type: Secondary | ICD-10-CM

## 2023-06-24 DIAGNOSIS — J029 Acute pharyngitis, unspecified: Secondary | ICD-10-CM | POA: Diagnosis not present

## 2023-06-24 DIAGNOSIS — E038 Other specified hypothyroidism: Secondary | ICD-10-CM

## 2023-06-24 DIAGNOSIS — E210 Primary hyperparathyroidism: Secondary | ICD-10-CM | POA: Diagnosis not present

## 2023-06-24 DIAGNOSIS — D509 Iron deficiency anemia, unspecified: Secondary | ICD-10-CM | POA: Diagnosis not present

## 2023-06-24 DIAGNOSIS — E559 Vitamin D deficiency, unspecified: Secondary | ICD-10-CM | POA: Diagnosis not present

## 2023-06-24 DIAGNOSIS — M170 Bilateral primary osteoarthritis of knee: Secondary | ICD-10-CM

## 2023-06-24 DIAGNOSIS — F32A Depression, unspecified: Secondary | ICD-10-CM

## 2023-06-24 DIAGNOSIS — G4733 Obstructive sleep apnea (adult) (pediatric): Secondary | ICD-10-CM

## 2023-06-24 DIAGNOSIS — R7303 Prediabetes: Secondary | ICD-10-CM | POA: Diagnosis not present

## 2023-06-24 LAB — POCT GLYCOSYLATED HEMOGLOBIN (HGB A1C): Hemoglobin A1C: 5.5 % (ref 4.0–5.6)

## 2023-06-24 MED ORDER — WEGOVY 0.25 MG/0.5ML ~~LOC~~ SOAJ
0.2500 mg | SUBCUTANEOUS | 0 refills | Status: DC
  Filled 2023-06-24 – 2023-08-05 (×4): qty 2, 28d supply, fill #0

## 2023-06-24 MED ORDER — PANTOPRAZOLE SODIUM 20 MG PO TBEC
20.0000 mg | DELAYED_RELEASE_TABLET | Freq: Every day | ORAL | 0 refills | Status: DC
  Filled 2023-06-24: qty 30, 30d supply, fill #0

## 2023-06-24 NOTE — Progress Notes (Signed)
Established patient visit  History, exam, impression, and plan:  1. Sore throat (Primary) Pleasant 58 year old female presenting today with complaints of having a sore throat with a burning sensation for the last several weeks.  She was able to cut out carbonated drinks such as sodas and has since noticed a difference.  The sensation is not gone however it has much improved and is not happening multiple times daily as it was.  Consider that it may be reflux.  No other associated symptoms.  Recommend trialing Protonix 20 mg daily for 2 weeks then 20 mg daily as needed to see if there is improvement.  If not, we will have her send me a message on MyChart for going to the next step of evaluation.  2. Primary hyperparathyroidism (HCC) Not currently on any medications for primary hyperparathyroidism.  Checking labs as noted below.  Denies any concerning symptoms. - PTH, Intact and Calcium - CMP14+EGFR  3. Other specified hypothyroidism Taking levothyroxine 25 mcg daily, tolerating well without side effects.  Not having any concerning symptoms at this time.  Rechecking TSH today. - TSH  4. Prediabetes 6 months ago, prediabetic range A1c at 5.9%.  Rechecking today with A1c at 5.5%.  Continue to work on weight loss, low-carb diet, and regular intentional exercise. - POCT HgB A1C  5. B12 deficiency History of B12 deficiency.  Rechecking B12 today. - Vitamin B12  6. Depressive disorder Endorses significant issues with mood management.  She is having some quite a bit of stress at home with her husband's daughter and his passive parenting methods.  She is not on any medications for mood management and is not sure that she is interested in starting anything at this time.  Denies SI/HI.  After discussion and hearing her concerns, she agreed that she would like a referral to behavioral health for counseling.  Referral placed. - Ambulatory referral to Behavioral Health  7. Microcytic  anemia History of microcytic anemia.  No current issues with lightheadedness, dizziness, or unusual fatigue.  Rechecking CBC today. - CBC  8. OSA on CPAP History of sleep apnea with sleep study on file.  Currently using her CPAP nightly but admits that this is a struggle as it is new to her.  Frequently forgets to put it on and is awakened by her husband to prompt her to use the CPAP.  Has not noticed much of a difference to date but plans to continue using the CPAP as prescribed.  9. Shift work sleep disorder She does work night shift which causes issues with daytime sleeping.  Has been using Ambien 5 mg at bedtime as needed, tolerating well without side effects.  Does not leave her too groggy and does not have any unusual behaviors while using the Ambien.  Reports that she does not use this every night and is aware of the precautions to use when taking Ambien.  Continue Ambien 5 mg nightly as needed.  10. Primary osteoarthritis of both knees Reports that she is taking Celebrex 200 mg twice daily as needed.  Has been on this regimen for a while and feels that it does continue to help with her knee pain.  Rechecking kidney function today.  Continue Celebrex as prescribed.  11. Vitamin D deficiency Had been treated with high-dose vitamin D replacement and completed the course.  Rechecking vitamin D today. - VITAMIN D 25 Hydroxy (Vit-D Deficiency, Fractures)  12.  Morbid obesity Patient presents today reporting that  she has done something that she knows she should not have.  Her husband had some Ozempic at home that he was not going to use since he was switched to [facility].  She has tampered with the Ozempic dose pen and started using a smaller dose of Ozempic.  Has been tolerating this well and is noted about 10 pounds of weight loss in the last month.  She is interested in retrying Ozempic or something similar to help with weight loss.  Discussed various options but advised that Mounjaro and  Ozempic are now reserved for patients with diabetes while Wegovy and Zepbound are used for weight loss.  We will send in Wegovy at the starter dose to see if this will get approved.  If not, we may consider Zepbound as she does have sleep apnea.  Procedures performed this visit: None.  Return in about 4 weeks (around 07/22/2023) for weight check.  (If able to get [facility].)  __________________________________ Thayer Ohm, DNP, APRN, FNP-BC Primary Care and Sports Medicine [facility] [place]

## 2023-06-25 ENCOUNTER — Other Ambulatory Visit (HOSPITAL_COMMUNITY): Payer: Self-pay

## 2023-06-25 ENCOUNTER — Encounter: Payer: Self-pay | Admitting: Medical-Surgical

## 2023-06-25 LAB — CMP14+EGFR
ALT: 11 [IU]/L (ref 0–32)
AST: 18 [IU]/L (ref 0–40)
Albumin: 4.3 g/dL (ref 3.8–4.9)
Alkaline Phosphatase: 61 [IU]/L (ref 44–121)
BUN/Creatinine Ratio: 11 (ref 9–23)
BUN: 10 mg/dL (ref 6–24)
Bilirubin Total: 0.3 mg/dL (ref 0.0–1.2)
CO2: 27 mmol/L (ref 20–29)
Calcium: 8.9 mg/dL (ref 8.7–10.2)
Chloride: 105 mmol/L (ref 96–106)
Creatinine, Ser: 0.93 mg/dL (ref 0.57–1.00)
Globulin, Total: 2.2 g/dL (ref 1.5–4.5)
Glucose: 88 mg/dL (ref 70–99)
Potassium: 4.1 mmol/L (ref 3.5–5.2)
Sodium: 146 mmol/L — ABNORMAL HIGH (ref 134–144)
Total Protein: 6.5 g/dL (ref 6.0–8.5)
eGFR: 72 mL/min/{1.73_m2} (ref >59)

## 2023-06-25 LAB — CBC
Hematocrit: 43.9 % (ref 34.0–46.6)
Hemoglobin: 13.8 g/dL (ref 11.1–15.9)
MCH: 25.3 pg — ABNORMAL LOW (ref 26.6–33.0)
MCHC: 31.4 g/dL — ABNORMAL LOW (ref 31.5–35.7)
MCV: 80 fL (ref 79–97)
Platelets: 206 10*3/uL (ref 150–450)
RBC: 5.46 x10E6/uL — ABNORMAL HIGH (ref 3.77–5.28)
RDW: 14.6 % (ref 11.7–15.4)
WBC: 4.9 10*3/uL (ref 3.4–10.8)

## 2023-06-25 LAB — TSH: TSH: 2.67 u[IU]/mL (ref 0.450–4.500)

## 2023-06-25 LAB — VITAMIN B12: Vitamin B-12: 921 pg/mL (ref 232–1245)

## 2023-06-25 LAB — PTH, INTACT AND CALCIUM: PTH: 31 pg/mL (ref 15–65)

## 2023-06-25 LAB — VITAMIN D 25 HYDROXY (VIT D DEFICIENCY, FRACTURES): Vit D, 25-Hydroxy: 41.8 ng/mL (ref 30.0–100.0)

## 2023-07-02 ENCOUNTER — Other Ambulatory Visit (HOSPITAL_COMMUNITY): Payer: Self-pay

## 2023-07-05 ENCOUNTER — Other Ambulatory Visit (HOSPITAL_COMMUNITY): Payer: Self-pay

## 2023-07-11 ENCOUNTER — Other Ambulatory Visit (HOSPITAL_COMMUNITY): Payer: Self-pay

## 2023-07-18 DIAGNOSIS — G4733 Obstructive sleep apnea (adult) (pediatric): Secondary | ICD-10-CM | POA: Diagnosis not present

## 2023-07-20 DIAGNOSIS — R7309 Other abnormal glucose: Secondary | ICD-10-CM | POA: Diagnosis not present

## 2023-07-27 ENCOUNTER — Encounter: Payer: Self-pay | Admitting: Medical-Surgical

## 2023-07-27 ENCOUNTER — Other Ambulatory Visit (HOSPITAL_COMMUNITY): Payer: Self-pay

## 2023-07-29 NOTE — Telephone Encounter (Signed)
 Started PA for [facility] but in patient allergy list shows allergy to Ozempic- both are semaglutide -continue with PA for [facility] ? Saved PA to complete if needed.  Cynthia [name] (Key: [id number]) Wegovy 0.25MG /0.5ML auto-injectors Form Pensions consultant Request Form

## 2023-07-30 NOTE — Telephone Encounter (Signed)
 Cynthia [name] (Key: [id number]) Wegovy 0.25MG /0.5ML auto-injectors Form Pensions consultant Request Form

## 2023-07-31 NOTE — Telephone Encounter (Signed)
  Cynthia [name] ([name]) - [id number] YNWGNF 0.25MG /0.5ML auto-injectors status: PA Response - ApprovedCreated: March 10th, 2025Sent: March 11th, 2025

## 2023-08-05 ENCOUNTER — Other Ambulatory Visit (HOSPITAL_COMMUNITY): Payer: Self-pay

## 2023-08-05 NOTE — Telephone Encounter (Signed)
 Do we have a Wegovy coupon that would make it more affordable? Thanks, Ander Slade

## 2023-08-07 NOTE — Telephone Encounter (Signed)
 Attempted call to patient - left a voice mail message that computer has mfg  coupon available and she should attempt to get this to see if this will reduce the cost enough to be affordable.

## 2023-08-15 ENCOUNTER — Encounter: Payer: Self-pay | Admitting: Medical-Surgical

## 2023-08-15 DIAGNOSIS — G4733 Obstructive sleep apnea (adult) (pediatric): Secondary | ICD-10-CM | POA: Diagnosis not present

## 2023-08-20 DIAGNOSIS — R7309 Other abnormal glucose: Secondary | ICD-10-CM | POA: Diagnosis not present

## 2023-09-15 DIAGNOSIS — G4733 Obstructive sleep apnea (adult) (pediatric): Secondary | ICD-10-CM | POA: Diagnosis not present

## 2023-10-03 ENCOUNTER — Ambulatory Visit: Payer: BC Managed Care – PPO | Admitting: Medical-Surgical

## 2023-10-03 NOTE — Progress Notes (Deleted)
   Established patient visit  History, exam, impression, and plan:  No problem-specific Assessment & Plan notes found for this encounter.   ROS  Physical Exam  Procedures performed this visit: None.  No follow-ups on file.  __________________________________ Thayer Ohm, DNP, APRN, FNP-BC Primary Care and Sports Medicine [facility]

## 2023-10-22 ENCOUNTER — Other Ambulatory Visit: Payer: Self-pay | Admitting: Obstetrics and Gynecology

## 2023-10-22 DIAGNOSIS — Z0000 Encounter for general adult medical examination without abnormal findings: Secondary | ICD-10-CM

## 2023-12-24 ENCOUNTER — Encounter: Payer: Self-pay | Admitting: Family Medicine

## 2023-12-24 ENCOUNTER — Other Ambulatory Visit: Payer: Self-pay

## 2023-12-24 ENCOUNTER — Ambulatory Visit (INDEPENDENT_AMBULATORY_CARE_PROVIDER_SITE_OTHER): Admitting: Family Medicine

## 2023-12-24 VITALS — BP 134/73 | Ht 66.0 in | Wt 271.0 lb

## 2023-12-24 DIAGNOSIS — M25531 Pain in right wrist: Secondary | ICD-10-CM

## 2023-12-24 DIAGNOSIS — M654 Radial styloid tenosynovitis [de Quervain]: Secondary | ICD-10-CM

## 2023-12-24 MED ORDER — METHYLPREDNISOLONE ACETATE 40 MG/ML IJ SUSP
40.0000 mg | Freq: Once | INTRAMUSCULAR | Status: AC
  Administered 2023-12-24: 40 mg via INTRA_ARTICULAR

## 2023-12-24 NOTE — Progress Notes (Unsigned)
 DATE OF VISIT: 12/24/2023        Cynthia [name] DOB: 07/31/1965 MRN: [id number]  CC:  Right hand/wrist pain  History of present Illness: Cynthia [name] is a 58 y.o. female who presents for evaluation of right hand/wrist pain that has been progressively worsening over the course of the past few weeks. 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 She reports that she has also been experiencing an intense throbbing pain in her right wrist after she gets home from work. She denies any injury or trauma to the area. She has been wearing a wrist brace that has been slightly helpful and reports that when she ices her wrist, this provides temporary relief. Tylenol  and Motrin  have not been helpful. She has also noticed a bump in her right wrist that has been present for 2 months.   Medications:  Outpatient Encounter Medications as of 12/24/2023  Medication Sig   acetaminophen  (TYLENOL ) 650 MG CR tablet Take 1 tablet (650 mg total) by mouth every 8 (eight) hours as needed for pain.   albuterol  (VENTOLIN  HFA) 108 (90 Base) MCG/ACT inhaler Inhale 2 puffs into the lungs every 4 hours as needed for wheezing or shortness of breath (and cough) for 3 days, then use as needed   AMBULATORY NON FORMULARY MEDICATION Continuous positive airway pressure (CPAP) machine set on AutoPAP (4-20 cmH2O), with all supplemental supplies as needed.   aspirin EC 81 MG tablet Take 81 mg by mouth daily.   celecoxib  (CELEBREX ) 200 MG capsule Take 1 capsule (200 mg) by mouth 2 times daily.   Docusate Sodium (COLACE PO) Take 100 mg by mouth daily.   levothyroxine  (SYNTHROID ) 50 MCG tablet Take 0.5 tablets (25 mcg total) by mouth daily before breakfast.   pantoprazole  (PROTONIX ) 20 MG tablet Take 1 tablet (20 mg total) by mouth daily.   WEGOVY  0.25 MG/0.5ML SOAJ Inject 0.25 mg into the skin once a week. Use this dose for 1 month (4  shots) and then increase to next higher dose.   zolpidem  (AMBIEN ) 5 MG tablet Take 1 tablet (5 mg total) by mouth at bedtime as needed for sleep   [EXPIRED] methylPREDNISolone  acetate (DEPO-MEDROL ) injection 40 mg    Cynthia facility-administered encounter medications on file as of 12/24/2023.    Allergies: is allergic to ozempic  (0.25 or 0.5 mg-dose) [semaglutide (0.25 or 0.5mg -dos)], latex, and penicillin g.  Physical Examination: Vitals: BP 134/73   Ht 5' 6 (1.676 m)   Wt 271 lb (122.9 kg)   LMP 04/24/2014   BMI 43.74 kg/m  GENERAL:  NOMI [name] is a 58 y.o. female appearing their stated age, alert and oriented x 3, in Cynthia apparent distress.  SKIN: Cynthia rashes or lesions, skin clean, dry, intact MSK: Right wrist and hand  Inspection: There is mild swelling along the radial aspect of the right wrist Palpation: There is tenderness to palpation at the base of the thumb, the radial wrist, and distal forearm. ROM: decreased extension and flexion of the wrist  Strength: 4/5 wrist flexion, extension, and right grip strength, exam limited due to pain Neuro/Vasc: sensation intact to light touch throughout, pulses 2+ and symmetric radial artery bilaterally Special Tests: Phalen maneuver and Tinel test negative, Finkelstein test (+)  Left wrist and hand: full range of motion, without pain or weakness  Assessment & Plan Right wrist pain  De Quervain's Tendonitis, right wrist Currently favor patient's  right-sided wrist/hand pain to be De Quervain's tendonitis given the locality of her symptoms, the sharp pain associated with repetitive movements at work, and positive Pension scheme manager. Additionally, POC Ultrasound in clinic today revealed increased fluid/inflammation within the first dorsal compartment of the wrist. Will treat with steroid injection today for relief of symptoms and will also provide a Spica splint to be worn at home.   Plan - POC ultrasound performed in clinic today -  Provided steroid injection for relief of symptoms - Provided Spica splint  - Follow-up in ***   Patient expressed understanding & agreement with above.  Encounter Diagnosis  [name] Primary?   Right wrist pain Yes    Orders Placed This Encounter  Procedures   US  LIMITED JOINT SPACE STRUCTURES UP RIGHT

## 2023-12-24 NOTE — Procedures (Signed)
 Per appt notes: patient needs to repeat due to insufficient data.

## 2023-12-25 NOTE — Patient Instructions (Signed)

## 2024-01-21 ENCOUNTER — Encounter: Payer: Self-pay | Admitting: Sports Medicine

## 2024-01-23 ENCOUNTER — Encounter: Payer: Self-pay | Admitting: Family Medicine

## 2024-01-23 ENCOUNTER — Ambulatory Visit (INDEPENDENT_AMBULATORY_CARE_PROVIDER_SITE_OTHER): Admitting: Family Medicine

## 2024-01-23 VITALS — BP 120/70 | Ht 66.0 in | Wt 268.0 lb

## 2024-01-23 DIAGNOSIS — M654 Radial styloid tenosynovitis [de Quervain]: Secondary | ICD-10-CM

## 2024-01-23 NOTE — Progress Notes (Cosign Needed)
 DATE OF VISIT: 01/23/2024        Cynthia [name] DOB: [birth year] MRN: [id number]  CC: Everitt Curt tenosynovitis  History of present Illness: Cynthia [name] is a 58 y.o. female who presents for a follow-up visit for right de Quervain's tenosynovitis.  She had a steroid injection on 12/24/2023 by Dr. Teressa.  Reports 500% improvement since the injection.  The pain resolved 3 to 4 days after the injection, that she had about 3 weeks off work with improvement of the pain.  Went to work last night and experienced some pain again while he is, but reports it as 1 out of 10.  She has still been wearing her brace at night but cannot wear it during her workday.  Medications:  Outpatient Encounter Medications as of 01/23/2024  Medication Sig   acetaminophen  (TYLENOL ) 650 MG CR tablet Take 1 tablet (650 mg total) by mouth every 8 (eight) hours as needed for pain.   albuterol  (VENTOLIN  HFA) 108 (90 Base) MCG/ACT inhaler Inhale 2 puffs into the lungs every 4 hours as needed for wheezing or shortness of breath (and cough) for 3 days, then use as needed   AMBULATORY NON FORMULARY MEDICATION Continuous positive airway pressure (CPAP) machine set on AutoPAP (4-20 cmH2O), with all supplemental supplies as needed.   aspirin EC 81 MG tablet Take 81 mg by mouth daily.   celecoxib  (CELEBREX ) 200 MG capsule Take 1 capsule (200 mg) by mouth 2 times daily.   Docusate Sodium (COLACE PO) Take 100 mg by mouth daily.   levothyroxine  (SYNTHROID ) 50 MCG tablet Take 0.5 tablets (25 mcg total) by mouth daily before breakfast.   pantoprazole  (PROTONIX ) 20 MG tablet Take 1 tablet (20 mg total) by mouth daily.   WEGOVY  0.25 MG/0.5ML SOAJ Inject 0.25 mg into the skin once a week. Use this dose for 1 month (4 shots) and then increase to next higher dose.   zolpidem  (AMBIEN ) 5 MG tablet Take 1 tablet (5 mg total) by mouth at bedtime as needed for sleep   No facility-administered encounter medications on file as of 01/23/2024.     Allergies: is allergic to ozempic  (0.25 or 0.5 mg-dose) [semaglutide (0.25 or 0.5mg -dos)], latex, and penicillin g.  Physical Examination: Vitals: BP 120/70   Ht 5' 6 (1.676 m)   Wt 268 lb (121.6 kg)   LMP 04/24/2014   BMI 43.26 kg/m  GENERAL:  Cynthia [name] is a 58 y.o. female appearing their stated age, alert and oriented x 3, in no apparent distress.  SKIN: no rashes or lesions, skin clean, dry, intact MSK: Negative finkelstein's. Mild pain with wrist adduction and abduction. No pain with wrist flexion/extension.  Neurovascularly intact NEURO: sensation intact to light touch VASC: pulses 2+ and symmetric bilaterally, no edema  Assessment & Plan De Quervain's tenosynovitis, right Significantly improved after steroid injection - Will trial thumb brace for possibly better tolerability at work - Continue Tylenol  ibuprofen  as needed for pain - Consider hand therapy if symptoms persist, caution with multiple steroid injections of the wrist - Follow-up as needed   Patient expressed understanding & agreement with above.  Encounter Diagnosis  Name Primary?   De Quervain's tenosynovitis, right Yes    No orders of the defined types were placed in this encounter.

## 2024-03-31 ENCOUNTER — Telehealth: Payer: Self-pay

## 2024-03-31 NOTE — Telephone Encounter (Signed)
 Cynthia [name] is due for a mammogram and pap. She states she will be getting both with GYN soon. I did ask that she let us  know when complete so we can get the records.

## 2024-04-24 ENCOUNTER — Encounter: Payer: Self-pay | Admitting: Medical-Surgical

## 2024-04-24 ENCOUNTER — Ambulatory Visit: Admitting: Medical-Surgical

## 2024-04-24 VITALS — BP 138/83 | HR 66 | Resp 20 | Ht 66.0 in | Wt 272.0 lb

## 2024-04-24 DIAGNOSIS — I872 Venous insufficiency (chronic) (peripheral): Secondary | ICD-10-CM | POA: Diagnosis not present

## 2024-04-24 DIAGNOSIS — R29898 Other symptoms and signs involving the musculoskeletal system: Secondary | ICD-10-CM | POA: Diagnosis not present

## 2024-04-24 DIAGNOSIS — R3581 Nocturnal polyuria: Secondary | ICD-10-CM

## 2024-04-24 LAB — POCT GLYCOSYLATED HEMOGLOBIN (HGB A1C): Hemoglobin A1C: 5.6 % (ref 4.0–5.6)

## 2024-04-24 NOTE — Progress Notes (Signed)
        Established patient visit   History of Present Illness   Discussed the use of AI scribe software for clinical note transcription with the patient, who gave verbal consent to proceed.  History of Present Illness   Cynthia [name] is a 58 year old female who presents with lower extremity swelling and leg heaviness.  Lower extremity edema and heaviness - Chronic lower extremity swelling with new onset sensation of heaviness, most pronounced upon waking - Achy legs and difficulty initiating movement in the morning, improves with activity - Daily use of compression socks provides partial relief from swelling  Nocturia and polydipsia - Frequent urination, waking at least four times nightly - Increased thirst since initiation of CPAP therapy - Hemoglobin A1c was 5.9 one year ago  Restless leg syndrome symptoms - Nocturnal tingling and 'creepy crawlies' in the legs  Supplement use - Daily over-the-counter B12 and multivitamin use     Physical Exam   Physical Exam Vitals reviewed.  Constitutional:      General: She is not in acute distress.    Appearance: Normal appearance.  HENT:     Head: Normocephalic and atraumatic.  Cardiovascular:     Rate and Rhythm: Normal rate and regular rhythm.     Pulses: Normal pulses.     Heart sounds: Normal heart sounds. No murmur heard.    No friction rub. No gallop.  Pulmonary:     Effort: Pulmonary effort is normal. No respiratory distress.     Breath sounds: Normal breath sounds. No wheezing.  Musculoskeletal:     Right lower leg: Edema present.     Left lower leg: Edema present.  Skin:    General: Skin is warm and dry.  Neurological:     Mental Status: She is alert and oriented to person, place, and time.  Psychiatric:        Mood and Affect: Mood normal.        Behavior: Behavior normal.        Thought Content: Thought content normal.        Judgment: Judgment normal.    Assessment & Plan     Chronic venous  insufficiency with lower extremity heaviness and aching Chronic venous insufficiency with heaviness and aching in legs, resolving with movement. Differential includes fluid retention, vitamin D  deficiency, or iron deficiency. Now experiencing RLS symptoms. - Ordered blood work for fluid volume, BNP, vitamin D , and iron levels. - Continue daily use of compression socks. - Start magnesium oxide 400 mg nightly. - Consider Requip if magnesium is ineffective.  Polyuria and nocturia Increased urination and thirst, especially at night. Previous A1c was 5.9. Differential includes diabetes mellitus or fluid retention. - Ordered A1c test- result of 5.6%. - Advised to monitor fluid intake, especially in the evening.     Follow up   Return if symptoms worsen or fail to improve. __________________________________ Zada FREDRIK Palin, DNP, APRN, FNP-BC Primary Care and Sports Medicine [facility] [place]

## 2024-04-26 ENCOUNTER — Ambulatory Visit: Payer: Self-pay | Admitting: Medical-Surgical

## 2024-04-26 LAB — CBC WITH DIFFERENTIAL/PLATELET
Basophils Absolute: 0.1 x10E3/uL (ref 0.0–0.2)
Basos: 1 %
EOS (ABSOLUTE): 0.4 x10E3/uL (ref 0.0–0.4)
Eos: 7 %
Hematocrit: 40.4 % (ref 34.0–46.6)
Hemoglobin: 12.9 g/dL (ref 11.1–15.9)
Immature Grans (Abs): 0 x10E3/uL (ref 0.0–0.1)
Immature Granulocytes: 0 %
Lymphocytes Absolute: 1.2 x10E3/uL (ref 0.7–3.1)
Lymphs: 21 %
MCH: 25.9 pg — ABNORMAL LOW (ref 26.6–33.0)
MCHC: 31.9 g/dL (ref 31.5–35.7)
MCV: 81 fL (ref 79–97)
Monocytes Absolute: 0.4 x10E3/uL (ref 0.1–0.9)
Monocytes: 8 %
Neutrophils Absolute: 3.4 x10E3/uL (ref 1.4–7.0)
Neutrophils: 63 %
Platelets: 209 x10E3/uL (ref 150–450)
RBC: 4.99 x10E6/uL (ref 3.77–5.28)
RDW: 13.7 % (ref 11.7–15.4)
WBC: 5.4 x10E3/uL (ref 3.4–10.8)

## 2024-04-26 LAB — COMPREHENSIVE METABOLIC PANEL WITH GFR
ALT: 20 IU/L (ref 0–32)
AST: 22 IU/L (ref 0–40)
Albumin: 4 g/dL (ref 3.8–4.9)
Alkaline Phosphatase: 62 IU/L (ref 49–135)
BUN/Creatinine Ratio: 16 (ref 9–23)
BUN: 16 mg/dL (ref 6–24)
Bilirubin Total: 0.3 mg/dL (ref 0.0–1.2)
CO2: 27 mmol/L (ref 20–29)
Calcium: 8.8 mg/dL (ref 8.7–10.2)
Chloride: 104 mmol/L (ref 96–106)
Creatinine, Ser: 0.99 mg/dL (ref 0.57–1.00)
Globulin, Total: 2.5 g/dL (ref 1.5–4.5)
Glucose: 90 mg/dL (ref 70–99)
Potassium: 4.3 mmol/L (ref 3.5–5.2)
Sodium: 143 mmol/L (ref 134–144)
Total Protein: 6.5 g/dL (ref 6.0–8.5)
eGFR: 67 mL/min/1.73 (ref >59)

## 2024-04-26 LAB — IRON,TIBC AND FERRITIN PANEL
Ferritin: 130 ng/mL (ref 15–150)
Iron Saturation: 24 % (ref 15–55)
Iron: 70 ug/dL (ref 27–159)
Total Iron Binding Capacity: 286 ug/dL (ref 250–450)
UIBC: 216 ug/dL (ref 131–425)

## 2024-04-26 LAB — TSH: TSH: 3.06 u[IU]/mL (ref 0.450–4.500)

## 2024-04-26 LAB — BRAIN NATRIURETIC PEPTIDE: BNP: 35.1 pg/mL (ref 0.0–100.0)

## 2024-04-26 LAB — MAGNESIUM: Magnesium: 2.3 mg/dL (ref 1.6–2.3)

## 2024-04-26 LAB — VITAMIN D 25 HYDROXY (VIT D DEFICIENCY, FRACTURES): Vit D, 25-Hydroxy: 35 ng/mL (ref 30.0–100.0)

## 2024-04-28 ENCOUNTER — Other Ambulatory Visit (HOSPITAL_COMMUNITY): Payer: Self-pay

## 2024-04-28 MED ORDER — ROPINIROLE HCL 0.25 MG PO TABS
0.2500 mg | ORAL_TABLET | Freq: Every day | ORAL | 1 refills | Status: AC
  Filled 2024-04-28: qty 60, 30d supply, fill #0
  Filled 2024-06-12: qty 60, 30d supply, fill #1

## 2024-04-29 ENCOUNTER — Other Ambulatory Visit (HOSPITAL_COMMUNITY): Payer: Self-pay

## 2024-04-29 DIAGNOSIS — R350 Frequency of micturition: Secondary | ICD-10-CM | POA: Diagnosis not present

## 2024-04-29 DIAGNOSIS — Z1330 Encounter for screening examination for mental health and behavioral disorders, unspecified: Secondary | ICD-10-CM | POA: Diagnosis not present

## 2024-04-29 DIAGNOSIS — Z1231 Encounter for screening mammogram for malignant neoplasm of breast: Secondary | ICD-10-CM | POA: Diagnosis not present

## 2024-04-29 DIAGNOSIS — Z1211 Encounter for screening for malignant neoplasm of colon: Secondary | ICD-10-CM | POA: Diagnosis not present

## 2024-04-29 DIAGNOSIS — Z86718 Personal history of other venous thrombosis and embolism: Secondary | ICD-10-CM | POA: Diagnosis not present

## 2024-04-29 DIAGNOSIS — Z124 Encounter for screening for malignant neoplasm of cervix: Secondary | ICD-10-CM | POA: Diagnosis not present

## 2024-04-29 DIAGNOSIS — Z01419 Encounter for gynecological examination (general) (routine) without abnormal findings: Secondary | ICD-10-CM | POA: Diagnosis not present

## 2024-04-29 DIAGNOSIS — R3915 Urgency of urination: Secondary | ICD-10-CM | POA: Diagnosis not present

## 2024-04-29 MED ORDER — NITROFURANTOIN MONOHYD MACRO 100 MG PO CAPS
100.0000 mg | ORAL_CAPSULE | Freq: Two times a day (BID) | ORAL | 0 refills | Status: AC
  Filled 2024-04-29: qty 10, 5d supply, fill #0

## 2024-04-29 MED ORDER — FLUCONAZOLE 150 MG PO TABS
150.0000 mg | ORAL_TABLET | ORAL | 1 refills | Status: AC
  Filled 2024-04-29 – 2024-06-12 (×2): qty 2, 3d supply, fill #0

## 2024-04-29 MED ORDER — NITROFURANTOIN MONOHYD MACRO 100 MG PO CAPS
ORAL_CAPSULE | ORAL | 0 refills | Status: AC
  Filled 2024-04-29: qty 10, 5d supply, fill #0

## 2024-04-29 MED ORDER — FLUCONAZOLE 150 MG PO TABS
ORAL_TABLET | ORAL | 1 refills | Status: AC
  Filled 2024-04-29: qty 2, 3d supply, fill #0
  Filled 2024-06-12: qty 2, 3d supply, fill #1

## 2024-04-30 ENCOUNTER — Other Ambulatory Visit: Payer: Self-pay | Admitting: Obstetrics and Gynecology

## 2024-04-30 DIAGNOSIS — R351 Nocturia: Secondary | ICD-10-CM | POA: Diagnosis not present

## 2024-04-30 DIAGNOSIS — Z1231 Encounter for screening mammogram for malignant neoplasm of breast: Secondary | ICD-10-CM

## 2024-05-01 ENCOUNTER — Other Ambulatory Visit (HOSPITAL_COMMUNITY): Payer: Self-pay

## 2024-05-07 ENCOUNTER — Encounter: Payer: Self-pay | Admitting: Medical-Surgical

## 2024-05-07 ENCOUNTER — Telehealth: Payer: Self-pay

## 2024-05-07 NOTE — Telephone Encounter (Signed)
 Left message for a return call. We do not have a record of recent mammogram or PAP.   Sent medical record release to Madolyn Monte, PA-C.

## 2024-05-26 ENCOUNTER — Other Ambulatory Visit: Payer: Self-pay | Admitting: Obstetrics and Gynecology

## 2024-05-26 DIAGNOSIS — N644 Mastodynia: Secondary | ICD-10-CM

## 2024-06-05 ENCOUNTER — Ambulatory Visit
Admission: RE | Admit: 2024-06-05 | Discharge: 2024-06-05 | Disposition: A | Discharge status: Home / Self Care | Source: Ambulatory Visit | Attending: Obstetrics and Gynecology | Admitting: Obstetrics and Gynecology

## 2024-06-05 DIAGNOSIS — N644 Mastodynia: Secondary | ICD-10-CM

## 2024-06-12 ENCOUNTER — Other Ambulatory Visit: Payer: Self-pay | Admitting: Medical-Surgical

## 2024-06-12 DIAGNOSIS — G4726 Circadian rhythm sleep disorder, shift work type: Secondary | ICD-10-CM

## 2024-06-13 ENCOUNTER — Other Ambulatory Visit: Payer: Self-pay | Admitting: Medical Genetics

## 2024-06-13 ENCOUNTER — Other Ambulatory Visit (HOSPITAL_COMMUNITY): Payer: Self-pay

## 2024-06-15 ENCOUNTER — Other Ambulatory Visit: Payer: Self-pay

## 2024-06-15 ENCOUNTER — Other Ambulatory Visit (HOSPITAL_COMMUNITY): Payer: Self-pay

## 2024-06-15 MED ORDER — ZOLPIDEM TARTRATE 5 MG PO TABS
5.0000 mg | ORAL_TABLET | Freq: Every evening | ORAL | 0 refills | Status: AC | PRN
  Filled 2024-06-15: qty 30, 30d supply, fill #0

## 2024-06-15 MED ORDER — CELECOXIB 200 MG PO CAPS
200.0000 mg | ORAL_CAPSULE | Freq: Two times a day (BID) | ORAL | 0 refills | Status: AC
  Filled 2024-06-15: qty 180, 90d supply, fill #0

## 2024-06-15 NOTE — Telephone Encounter (Signed)
 ZOLPIDEM  Last OV: 04/24/24 Next OV: no appt scheduled Last RF: 11/28/22

## 2024-06-20 ENCOUNTER — Telehealth: Admitting: Family Medicine

## 2024-06-20 DIAGNOSIS — J069 Acute upper respiratory infection, unspecified: Secondary | ICD-10-CM

## 2024-06-20 DIAGNOSIS — R6889 Other general symptoms and signs: Secondary | ICD-10-CM | POA: Diagnosis not present

## 2024-06-20 MED ORDER — PREDNISONE 20 MG PO TABS: 20.0000 mg | ORAL_TABLET | Freq: Two times a day (BID) | ORAL | 0 refills | Status: AC

## 2024-06-20 MED ORDER — PROMETHAZINE-DM 6.25-15 MG/5ML PO SYRP: 5.0000 mL | ORAL_SOLUTION | Freq: Four times a day (QID) | ORAL | 0 refills | Status: AC | PRN

## 2024-06-20 MED ORDER — OSELTAMIVIR PHOSPHATE 75 MG PO CAPS: 75.0000 mg | ORAL_CAPSULE | Freq: Two times a day (BID) | ORAL | 0 refills | Status: AC

## 2024-06-20 NOTE — Patient Instructions (Signed)
 The Flu (Influenza) in Adults: What to Know Influenza is also called the flu. It's an infection that affects your respiratory tract. This includes your nose, throat, windpipe, and lungs. The flu is contagious. This means it spreads easily from person to person. It causes symptoms that are like a cold. It can also cause a high fever and body aches. What are the causes? The flu is caused by the influenza virus. You can get it by: Breathing in droplets that are in the air after an infected person coughs or sneezes. Touching something that has the virus on it and then touching your mouth, nose, or eyes. What increases the risk? You may be more likely to get the flu if: You don't wash your hands often. You're near a lot of people during cold and flu season. You touch your mouth, eyes, or nose without washing your hands first. You don't get a flu shot each year. You may also be more at risk for the flu and serious problems, such as a lung infection called pneumonia, if: You're older than 65. You're pregnant. Your immune system is weak. Your immune system is your body's defense system. You have a long-term, or chronic, condition, such as: Heart, kidney, or lung disease. Diabetes. A liver disorder. Asthma. You're very overweight. You have anemia. This is when you don't have enough red blood cells in your body. What are the signs or symptoms? Flu symptoms often start all of a sudden. They may last 4-14 days and include: Fever and chills. Headaches, body aches, or muscle aches. Sore throat. Cough. Runny or stuffy nose. Discomfort in your chest. Not wanting to eat as much as normal. Feeling weak or tired. Feeling dizzy. Nausea or vomiting. How is this diagnosed? The flu may be diagnosed based on your symptoms and medical history. You may also have a physical exam. A swab may be taken from your nose or throat and tested for the virus. How is this treated? If the flu is found early, you can  be treated with antiviral medicine. This may be given to you by mouth or through an IV. It can help you feel less sick and get better faster. Taking care of yourself at home can also help your symptoms get better. Your health care provider may tell you to: Take over-the-counter medicines. Drink lots of fluids. The flu often goes away on its own. If you have very bad symptoms or problems caused by the flu, you may need to be treated in a hospital. Follow these instructions at home: Activity Rest as needed. Get lots of sleep. Stay home from work or school as told by your provider. Leave home only to go see your provider. Do not leave home for other reasons until you don't have a fever for 24 hours without taking medicine. Eating and drinking Take an oral rehydration solution (ORS). This is a drink that is sold at pharmacies and stores. Drink enough fluid to keep your pee pale yellow. Try to drink small amounts of clear fluids. These include water, ice chips, fruit juice mixed with water, and low-calorie sports drinks. Try to eat bland foods that are easy to digest. These include bananas, applesauce, rice, lean meats, toast, and crackers. Avoid drinks that have a lot of sugar or caffeine in them. These include energy drinks, regular sports drinks, and soda. Do not drink alcohol. Do not eat spicy or fatty foods. General instructions     Take your medicines only as told by your  provider. Use a cool mist humidifier to add moisture to the air in your home. This can make it easier for you to breathe. You should also clean the humidifier every day. To do so: Empty the water. Pour clean water in. Cover your mouth and nose when you cough or sneeze. Wash your hands with soap and water often and for at least 20 seconds. It's extra important to do so after you cough or sneeze. If you can't use soap and water, use hand sanitizer. How is this prevented?  Get a flu shot every year. Ask your provider  when you should get your flu shot. Stay away from people who are sick during fall and winter. Fall and winter are cold and flu season. Contact a health care provider if: You get new symptoms. You have chest pain. You have watery poop, also called diarrhea. You have a fever. Your cough gets worse. You start to have more mucus. You feel like you may vomit, or you vomit. Get help right away if: You become short of breath or have trouble breathing. Your skin or nails turn blue. You have very bad pain or stiffness in your neck. You get a sudden headache or pain in your face or ear. You vomit each time you eat or drink. These symptoms may be an emergency. Call 911 right away. Do not wait to see if the symptoms will go away. Do not drive yourself to the hospital. This information is not intended to replace advice given to you by your health care provider. Make sure you discuss any questions you have with your health care provider. Document Revised: 03/17/2024 Document Reviewed: 06/14/2022 Elsevier Patient Education  2025 [employer].

## 2024-06-20 NOTE — Progress Notes (Signed)
 " Virtual Visit Consent   [name], you are scheduled for a virtual visit with a [facility] provider today. Just as with appointments in the office, your consent must be obtained to participate. Your consent will be active for this visit and any virtual visit you may have with one of our providers in the next 365 days. If you have a MyChart account, a copy of this consent can be sent to you electronically.  As this is a virtual visit, video technology does not allow for your provider to perform a traditional examination. This may limit your provider's ability to fully assess your condition. If your provider identifies any concerns that need to be evaluated in person or the need to arrange testing (such as labs, EKG, etc.), we will make arrangements to do so. Although advances in technology are sophisticated, we cannot ensure that it will always work on either your end or our end. If the connection with a video visit is poor, the visit may have to be switched to a telephone visit. With either a video or telephone visit, we are not always able to ensure that we have a secure connection.  By engaging in this virtual visit, you consent to the provision of healthcare and authorize for your insurance to be billed (if applicable) for the services provided during this visit. Depending on your insurance coverage, you may receive a charge related to this service.  I need to obtain your verbal consent now. Are you willing to proceed with your visit today? Cynthia [name] has provided verbal consent on 06/20/2024 for a virtual visit (video or telephone). Loa Lamp, FNP  Date: 06/20/2024 9:36 AM   Virtual Visit via Video Note   I, Loa Lamp, connected with  Cynthia [name]  ([id number], 1965/12/11) on 06/20/24 at  9:30 AM EST by a video-enabled telemedicine application and verified that I am speaking with the correct person using two identifiers.  Location: Patient: Virtual Visit  Location Patient: Home Provider: Virtual Visit Location Provider: Home Office   I discussed the limitations of evaluation and management by telemedicine and the availability of in person appointments. The patient expressed understanding and agreed to proceed.    History of Present Illness: Cynthia [name] is a 59 y.o. who identifies as a female who was assigned female at birth, and is being seen today for fatigue, sleeping more, chills, head congestion, on mucinex , hoarse, coughing, keeping her up at night, she is wheezing and sob, history of asthma. Sx less than 48 hrs. Flu sx. SABRA  HPI: HPI  Problems:  Patient Active Problem List   Diagnosis Date Noted   Morbid obesity (HCC) 12/24/2022   Prediabetes 12/24/2022   B12 deficiency 12/24/2022   Other specified hypothyroidism 12/24/2022   PCOS (polycystic ovarian syndrome) 12/24/2022   SOB (shortness of breath) on exertion 12/24/2022   Other fatigue 12/24/2022   Abdominal pain 10/25/2022   Polycystic ovaries 10/25/2022   Primary hyperparathyroidism 04/28/2020   Right thyroid  nodule 02/28/2020   Depressive disorder 03/18/2019   Venous insufficiency of both lower extremities 11/11/2018   Class 3 severe obesity due to excess calories with serious comorbidity and body mass index (BMI) of 45.0 to 49.9 in adult (HCC) 11/11/2018   Chronic cough 10/23/2018   Bradycardia with 51-60 beats per minute 10/09/2018   Laryngospasms 10/09/2018   Dyspepsia 10/09/2018   Bronchospasm, acute 05/27/2018   Microcytic anemia 11/13/2017   Hypercalcemia 11/13/2017   Sacroiliac joint dysfunction of both  sides 10/21/2017   Lumbar degenerative disc disease 10/21/2017   Primary osteoarthritis of both knees 09/19/2017   Peripheral edema 09/19/2017   Morbid obesity with BMI of 40.0-44.9, adult (HCC) 09/18/2017   Urinary incontinence 09/18/2017   LGSIL on Pap smear of cervix 11/22/2015   Migraine 11/17/2015   Vitamin D  deficiency 11/17/2015   Lapband APL +  HH repair (1 suture) 01/28/2012    Allergies: Allergies[1] Medications: Current Medications[2]  Observations/Objective: Patient is well-developed, well-nourished in no acute distress.  Resting comfortably  at home.  Head is normocephalic, atraumatic.  No labored breathing.  Speech is clear and coherent with logical content.  Patient is alert and oriented at baseline.    Assessment and Plan: 1. Flu-like symptoms (Primary)  2. Upper respiratory tract infection, unspecified type  Increase fluids, humidifier at night, tylenol  or ibuprofen  as directed, UC as needed   Follow Up Instructions: I discussed the assessment and treatment plan with the patient. The patient was provided an opportunity to ask questions and all were answered. The patient agreed with the plan and demonstrated an understanding of the instructions.  A copy of instructions were sent to the patient via MyChart unless otherwise noted below.     The patient was advised to call back or seek an in-person evaluation if the symptoms worsen or if the condition fails to improve as anticipated.    Tommey Barret, FNP     [1]  Allergies Allergen Reactions   Ozempic  (0.25 Or 0.5 Mg-Dose) [Semaglutide (0.25 Or 0.5mg -Dos)]     Dizziness    Latex Rash and Other (See Comments)    Where touched and will spread.   Penicillin G Itching  [2]  Current Outpatient Medications:    oseltamivir  (TAMIFLU ) 75 MG capsule, Take 1 capsule (75 mg total) by mouth 2 (two) times daily for 5 days., Disp: 10 capsule, Rfl: 0   predniSONE  (DELTASONE ) 20 MG tablet, Take 1 tablet (20 mg total) by mouth 2 (two) times daily with a meal for 5 days., Disp: 10 tablet, Rfl: 0   promethazine -dextromethorphan (PROMETHAZINE -DM) 6.25-15 MG/5ML syrup, Take 5 mLs by mouth 4 (four) times daily as needed for up to 10 days for cough., Disp: 118 mL, Rfl: 0   acetaminophen  (TYLENOL ) 650 MG CR tablet, Take 1 tablet (650 mg total) by mouth every 8 (eight) hours as needed  for pain., Disp: , Rfl:    albuterol  (VENTOLIN  HFA) 108 (90 Base) MCG/ACT inhaler, Inhale 2 puffs into the lungs every 4 hours as needed for wheezing or shortness of breath (and cough) for 3 days, then use as needed, Disp: 6.7 g, Rfl: 5   AMBULATORY NON FORMULARY MEDICATION, Continuous positive airway pressure (CPAP) machine set on AutoPAP (4-20 cmH2O), with all supplemental supplies as needed., Disp: 1 each, Rfl: 0   aspirin EC 81 MG tablet, Take 81 mg by mouth daily., Disp: , Rfl:    celecoxib  (CELEBREX ) 200 MG capsule, Take 1 capsule (200 mg) by mouth 2 times daily., Disp: 180 capsule, Rfl: 0   Cyanocobalamin  (B-12 PO), Take by mouth., Disp: , Rfl:    Docusate Sodium (COLACE PO), Take 100 mg by mouth daily., Disp: , Rfl:    fluconazole  (DIFLUCAN ) 150 MG tablet, Take 1 tablet by mouth now and then repeat in 3 days, Disp: 2 tablet, Rfl: 1   fluconazole  (DIFLUCAN ) 150 MG tablet, Take 1 tablet by mouth now and repeat in 3 days., Disp: 2 tablet, Rfl: 1   nitrofurantoin , macrocrystal-monohydrate, (MACROBID ) 100  MG capsule, Take 1 capsule by mouth twice daily after meals for 5 days, Disp: 10 capsule, Rfl: 0   Prenatal Vit-Fe Fumarate-FA (M-VIT PO), Take by mouth., Disp: , Rfl:    rOPINIRole  (REQUIP ) 0.25 MG tablet, Take 1-2 tablets (0.25-0.5 mg total) by mouth at bedtime., Disp: 60 tablet, Rfl: 1   zolpidem  (AMBIEN ) 5 MG tablet, Take 1 tablet (5 mg total) by mouth at bedtime as needed for sleep, Disp: 30 tablet, Rfl: 0  "

## 2024-07-15 ENCOUNTER — Other Ambulatory Visit (HOSPITAL_COMMUNITY)
